# Patient Record
Sex: Female | Born: 1955 | Race: Black or African American | Hispanic: No | Smoking: Former smoker
Health system: Southern US, Community
[De-identification: ages and names within clinical notes are randomized; demographics above are authoritative.]

## PROBLEM LIST (undated history)

## (undated) DIAGNOSIS — M75111 Incomplete rotator cuff tear or rupture of right shoulder, not specified as traumatic: Secondary | ICD-10-CM

## (undated) DIAGNOSIS — M7501 Adhesive capsulitis of right shoulder: Secondary | ICD-10-CM

## (undated) DIAGNOSIS — M1990 Unspecified osteoarthritis, unspecified site: Secondary | ICD-10-CM

## (undated) DIAGNOSIS — R0600 Dyspnea, unspecified: Secondary | ICD-10-CM

## (undated) DIAGNOSIS — F419 Anxiety disorder, unspecified: Secondary | ICD-10-CM

## (undated) DIAGNOSIS — E119 Type 2 diabetes mellitus without complications: Secondary | ICD-10-CM

## (undated) DIAGNOSIS — M7581 Other shoulder lesions, right shoulder: Secondary | ICD-10-CM

## (undated) DIAGNOSIS — K5792 Diverticulitis of intestine, part unspecified, without perforation or abscess without bleeding: Secondary | ICD-10-CM

## (undated) DIAGNOSIS — K219 Gastro-esophageal reflux disease without esophagitis: Secondary | ICD-10-CM

## (undated) DIAGNOSIS — I10 Essential (primary) hypertension: Secondary | ICD-10-CM

## (undated) DIAGNOSIS — D649 Anemia, unspecified: Secondary | ICD-10-CM

## (undated) DIAGNOSIS — S46101A Unspecified injury of muscle, fascia and tendon of long head of biceps, right arm, initial encounter: Secondary | ICD-10-CM

## (undated) DIAGNOSIS — R42 Dizziness and giddiness: Secondary | ICD-10-CM

## (undated) DIAGNOSIS — D509 Iron deficiency anemia, unspecified: Secondary | ICD-10-CM

## (undated) HISTORY — PX: WISDOM TOOTH EXTRACTION: SHX21

## (undated) HISTORY — PX: ABDOMINAL HYSTERECTOMY: SHX81

## (undated) HISTORY — PX: TONSILLECTOMY: SUR1361

---

## 2004-08-12 ENCOUNTER — Ambulatory Visit: Payer: Self-pay

## 2005-01-10 ENCOUNTER — Emergency Department: Payer: Self-pay | Admitting: Emergency Medicine

## 2005-04-29 ENCOUNTER — Emergency Department: Payer: Self-pay | Admitting: Emergency Medicine

## 2005-05-07 ENCOUNTER — Emergency Department: Payer: Self-pay | Admitting: Emergency Medicine

## 2005-05-24 ENCOUNTER — Emergency Department: Payer: Self-pay | Admitting: Unknown Physician Specialty

## 2005-07-05 ENCOUNTER — Emergency Department: Payer: Self-pay | Admitting: Emergency Medicine

## 2005-08-09 ENCOUNTER — Ambulatory Visit: Payer: Self-pay | Admitting: Gastroenterology

## 2005-10-12 ENCOUNTER — Ambulatory Visit: Payer: Self-pay

## 2006-06-15 ENCOUNTER — Ambulatory Visit: Payer: Self-pay | Admitting: Gastroenterology

## 2006-06-26 ENCOUNTER — Ambulatory Visit: Payer: Self-pay | Admitting: Gastroenterology

## 2006-12-13 ENCOUNTER — Ambulatory Visit: Payer: Self-pay

## 2007-11-17 ENCOUNTER — Emergency Department: Payer: Self-pay | Admitting: Internal Medicine

## 2008-01-16 ENCOUNTER — Ambulatory Visit: Payer: Self-pay

## 2008-11-02 ENCOUNTER — Emergency Department: Payer: Self-pay | Admitting: Unknown Physician Specialty

## 2008-12-17 DIAGNOSIS — F419 Anxiety disorder, unspecified: Secondary | ICD-10-CM | POA: Insufficient documentation

## 2008-12-17 DIAGNOSIS — D509 Iron deficiency anemia, unspecified: Secondary | ICD-10-CM | POA: Insufficient documentation

## 2009-02-27 ENCOUNTER — Emergency Department: Payer: Self-pay | Admitting: Emergency Medicine

## 2009-04-15 ENCOUNTER — Ambulatory Visit: Payer: Self-pay

## 2009-05-20 ENCOUNTER — Ambulatory Visit: Payer: Self-pay | Admitting: Gastroenterology

## 2010-06-20 ENCOUNTER — Emergency Department: Payer: Self-pay | Admitting: Emergency Medicine

## 2010-07-14 ENCOUNTER — Ambulatory Visit: Payer: Self-pay

## 2011-02-14 ENCOUNTER — Emergency Department: Payer: Self-pay | Admitting: Emergency Medicine

## 2011-08-16 ENCOUNTER — Ambulatory Visit: Payer: Self-pay

## 2012-07-11 DIAGNOSIS — K219 Gastro-esophageal reflux disease without esophagitis: Secondary | ICD-10-CM | POA: Insufficient documentation

## 2012-08-16 ENCOUNTER — Ambulatory Visit: Payer: Self-pay | Admitting: Family Medicine

## 2013-05-03 ENCOUNTER — Emergency Department: Payer: Self-pay | Admitting: Emergency Medicine

## 2013-05-14 ENCOUNTER — Emergency Department: Payer: Self-pay | Admitting: Emergency Medicine

## 2013-05-14 LAB — BASIC METABOLIC PANEL
ANION GAP: 6 — AB (ref 7–16)
BUN: 12 mg/dL (ref 7–18)
CHLORIDE: 106 mmol/L (ref 98–107)
CO2: 28 mmol/L (ref 21–32)
Calcium, Total: 9.6 mg/dL (ref 8.5–10.1)
Creatinine: 0.87 mg/dL (ref 0.60–1.30)
EGFR (African American): >60
Glucose: 85 mg/dL (ref 65–99)
OSMOLALITY: 278 (ref 275–301)
Potassium: 3.6 mmol/L (ref 3.5–5.1)
Sodium: 140 mmol/L (ref 136–145)

## 2013-05-14 LAB — URINALYSIS, COMPLETE
BACTERIA: NONE SEEN
BLOOD: NEGATIVE
Bilirubin,UR: NEGATIVE
Glucose,UR: NEGATIVE mg/dL (ref 0–75)
KETONE: NEGATIVE
LEUKOCYTE ESTERASE: NEGATIVE
Nitrite: NEGATIVE
PH: 7 (ref 4.5–8.0)
Protein: NEGATIVE
Specific Gravity: 1.003 (ref 1.003–1.030)
Squamous Epithelial: <1

## 2013-05-14 LAB — TROPONIN I: Troponin-I: <0.02 ng/mL

## 2013-05-14 LAB — CBC WITH DIFFERENTIAL/PLATELET
BASOS ABS: 0.1 10*3/uL (ref 0.0–0.1)
Basophil %: 1 %
EOS ABS: 0.1 10*3/uL (ref 0.0–0.7)
EOS PCT: 2.4 %
HCT: 36.8 % (ref 35.0–47.0)
HGB: 11.4 g/dL — ABNORMAL LOW (ref 12.0–16.0)
LYMPHS ABS: 2.4 10*3/uL (ref 1.0–3.6)
Lymphocyte %: 45.5 %
MCH: 26.3 pg (ref 26.0–34.0)
MCHC: 31 g/dL — ABNORMAL LOW (ref 32.0–36.0)
MCV: 85 fL (ref 80–100)
Monocyte #: 0.4 x10 3/mm (ref 0.2–0.9)
Monocyte %: 7.7 %
Neutrophil #: 2.3 10*3/uL (ref 1.4–6.5)
Neutrophil %: 43.4 %
Platelet: 280 10*3/uL (ref 150–440)
RBC: 4.35 10*6/uL (ref 3.80–5.20)
RDW: 13.8 % (ref 11.5–14.5)
WBC: 5.2 10*3/uL (ref 3.6–11.0)

## 2013-06-01 ENCOUNTER — Emergency Department: Payer: Self-pay | Admitting: Emergency Medicine

## 2013-06-01 LAB — CBC WITH DIFFERENTIAL/PLATELET
Basophil #: 0 10*3/uL (ref 0.0–0.1)
Basophil %: 0.8 %
Eosinophil #: 0.1 10*3/uL (ref 0.0–0.7)
Eosinophil %: 1.1 %
HCT: 36 % (ref 35.0–47.0)
HGB: 11.7 g/dL — ABNORMAL LOW (ref 12.0–16.0)
LYMPHS PCT: 30.6 %
Lymphocyte #: 1.7 10*3/uL (ref 1.0–3.6)
MCH: 27.5 pg (ref 26.0–34.0)
MCHC: 32.4 g/dL (ref 32.0–36.0)
MCV: 85 fL (ref 80–100)
MONO ABS: 0.7 x10 3/mm (ref 0.2–0.9)
Monocyte %: 11.9 %
Neutrophil #: 3 10*3/uL (ref 1.4–6.5)
Neutrophil %: 55.6 %
PLATELETS: 213 10*3/uL (ref 150–440)
RBC: 4.24 10*6/uL (ref 3.80–5.20)
RDW: 13.3 % (ref 11.5–14.5)
WBC: 5.5 10*3/uL (ref 3.6–11.0)

## 2013-06-01 LAB — COMPREHENSIVE METABOLIC PANEL
ALT: 27 U/L (ref 12–78)
AST: 22 U/L (ref 15–37)
Albumin: 3.5 g/dL (ref 3.4–5.0)
Alkaline Phosphatase: 67 U/L
Anion Gap: 4 — ABNORMAL LOW (ref 7–16)
BUN: 17 mg/dL (ref 7–18)
Bilirubin,Total: 0.3 mg/dL (ref 0.2–1.0)
CREATININE: 0.82 mg/dL (ref 0.60–1.30)
Calcium, Total: 8.8 mg/dL (ref 8.5–10.1)
Chloride: 104 mmol/L (ref 98–107)
Co2: 30 mmol/L (ref 21–32)
EGFR (African American): >60
EGFR (Non-African Amer.): >60
GLUCOSE: 87 mg/dL (ref 65–99)
OSMOLALITY: 277 (ref 275–301)
Potassium: 3.4 mmol/L — ABNORMAL LOW (ref 3.5–5.1)
SODIUM: 138 mmol/L (ref 136–145)
Total Protein: 7 g/dL (ref 6.4–8.2)

## 2013-06-01 LAB — URINALYSIS, COMPLETE
BILIRUBIN, UR: NEGATIVE
Bacteria: NONE SEEN
GLUCOSE, UR: NEGATIVE mg/dL (ref 0–75)
Ketone: NEGATIVE
Leukocyte Esterase: NEGATIVE
Nitrite: NEGATIVE
PH: 5 (ref 4.5–8.0)
Protein: NEGATIVE
RBC,UR: 3 /HPF (ref 0–5)
Specific Gravity: 1.025 (ref 1.003–1.030)
WBC UR: 3 /HPF (ref 0–5)

## 2013-06-01 LAB — LIPASE, BLOOD: Lipase: 138 U/L (ref 73–393)

## 2013-06-01 LAB — TROPONIN I

## 2013-06-02 LAB — TROPONIN I: Troponin-I: <0.02 ng/mL

## 2013-06-16 ENCOUNTER — Emergency Department: Payer: Self-pay | Admitting: Emergency Medicine

## 2013-06-16 LAB — CBC
HCT: 35.5 % (ref 35.0–47.0)
HGB: 11.1 g/dL — ABNORMAL LOW (ref 12.0–16.0)
MCH: 26.4 pg (ref 26.0–34.0)
MCHC: 31.2 g/dL — AB (ref 32.0–36.0)
MCV: 85 fL (ref 80–100)
Platelet: 264 10*3/uL (ref 150–440)
RBC: 4.2 10*6/uL (ref 3.80–5.20)
RDW: 13.8 % (ref 11.5–14.5)
WBC: 3.6 10*3/uL (ref 3.6–11.0)

## 2013-06-16 LAB — COMPREHENSIVE METABOLIC PANEL
ALT: 35 U/L (ref 12–78)
ANION GAP: 4 — AB (ref 7–16)
AST: 27 U/L (ref 15–37)
Albumin: 3.7 g/dL (ref 3.4–5.0)
Alkaline Phosphatase: 69 U/L
BUN: 12 mg/dL (ref 7–18)
Bilirubin,Total: 0.3 mg/dL (ref 0.2–1.0)
CHLORIDE: 108 mmol/L — AB (ref 98–107)
Calcium, Total: 9.1 mg/dL (ref 8.5–10.1)
Co2: 31 mmol/L (ref 21–32)
Creatinine: 0.95 mg/dL (ref 0.60–1.30)
EGFR (African American): >60
EGFR (Non-African Amer.): >60
Glucose: 91 mg/dL (ref 65–99)
OSMOLALITY: 284 (ref 275–301)
POTASSIUM: 3.7 mmol/L (ref 3.5–5.1)
SODIUM: 143 mmol/L (ref 136–145)
TOTAL PROTEIN: 7.3 g/dL (ref 6.4–8.2)

## 2013-06-16 LAB — LIPASE, BLOOD: Lipase: 146 U/L (ref 73–393)

## 2013-08-19 ENCOUNTER — Ambulatory Visit: Payer: Self-pay | Admitting: Family Medicine

## 2013-12-24 ENCOUNTER — Emergency Department: Payer: Self-pay | Admitting: Emergency Medicine

## 2013-12-24 LAB — CBC WITH DIFFERENTIAL/PLATELET
BASOS PCT: 0.5 %
Basophil #: 0 10*3/uL (ref 0.0–0.1)
Eosinophil #: 0.1 10*3/uL (ref 0.0–0.7)
Eosinophil %: 1.1 %
HCT: 36.1 % (ref 35.0–47.0)
HGB: 11.4 g/dL — ABNORMAL LOW (ref 12.0–16.0)
LYMPHS ABS: 1.4 10*3/uL (ref 1.0–3.6)
Lymphocyte %: 22.3 %
MCH: 27.1 pg (ref 26.0–34.0)
MCHC: 31.5 g/dL — ABNORMAL LOW (ref 32.0–36.0)
MCV: 86 fL (ref 80–100)
MONOS PCT: 8.2 %
Monocyte #: 0.5 x10 3/mm (ref 0.2–0.9)
Neutrophil #: 4.4 10*3/uL (ref 1.4–6.5)
Neutrophil %: 67.9 %
Platelet: 219 10*3/uL (ref 150–440)
RBC: 4.2 10*6/uL (ref 3.80–5.20)
RDW: 13.7 % (ref 11.5–14.5)
WBC: 6.5 10*3/uL (ref 3.6–11.0)

## 2013-12-24 LAB — COMPREHENSIVE METABOLIC PANEL
ALBUMIN: 3.4 g/dL (ref 3.4–5.0)
AST: 66 U/L — AB (ref 15–37)
Alkaline Phosphatase: 96 U/L
Anion Gap: 6 — ABNORMAL LOW (ref 7–16)
BUN: 15 mg/dL (ref 7–18)
Bilirubin,Total: 0.2 mg/dL (ref 0.2–1.0)
CALCIUM: 8.9 mg/dL (ref 8.5–10.1)
CHLORIDE: 109 mmol/L — AB (ref 98–107)
CO2: 31 mmol/L (ref 21–32)
CREATININE: 0.87 mg/dL (ref 0.60–1.30)
EGFR (Non-African Amer.): >60
Glucose: 105 mg/dL — ABNORMAL HIGH (ref 65–99)
OSMOLALITY: 292 (ref 275–301)
Potassium: 3.8 mmol/L (ref 3.5–5.1)
SGPT (ALT): 65 U/L — ABNORMAL HIGH
SODIUM: 146 mmol/L — AB (ref 136–145)
TOTAL PROTEIN: 7 g/dL (ref 6.4–8.2)

## 2013-12-24 LAB — URINALYSIS, COMPLETE
BILIRUBIN, UR: NEGATIVE
BLOOD: NEGATIVE
Bacteria: NONE SEEN
GLUCOSE, UR: NEGATIVE mg/dL (ref 0–75)
Ketone: NEGATIVE
LEUKOCYTE ESTERASE: NEGATIVE
NITRITE: NEGATIVE
PH: 7 (ref 4.5–8.0)
Protein: NEGATIVE
RBC,UR: 2 /HPF (ref 0–5)
Specific Gravity: 1.016 (ref 1.003–1.030)
WBC UR: 1 /HPF (ref 0–5)

## 2013-12-24 LAB — TROPONIN I: Troponin-I: <0.02 ng/mL

## 2013-12-24 LAB — LIPASE, BLOOD: Lipase: 145 U/L (ref 73–393)

## 2015-01-22 ENCOUNTER — Other Ambulatory Visit: Payer: Self-pay | Admitting: Family Medicine

## 2015-01-22 DIAGNOSIS — Z1231 Encounter for screening mammogram for malignant neoplasm of breast: Secondary | ICD-10-CM

## 2015-01-27 ENCOUNTER — Ambulatory Visit
Admission: RE | Admit: 2015-01-27 | Discharge: 2015-01-27 | Disposition: A | Discharge status: Home / Self Care | Payer: BLUE CROSS/BLUE SHIELD | Source: Ambulatory Visit | Attending: Family Medicine | Admitting: Family Medicine

## 2015-01-27 DIAGNOSIS — Z1231 Encounter for screening mammogram for malignant neoplasm of breast: Secondary | ICD-10-CM

## 2015-10-20 ENCOUNTER — Emergency Department: Payer: BLUE CROSS/BLUE SHIELD

## 2015-10-20 ENCOUNTER — Emergency Department
Admission: EM | Admit: 2015-10-20 | Discharge: 2015-10-20 | Disposition: A | Discharge status: Home / Self Care | Payer: BLUE CROSS/BLUE SHIELD | Attending: Emergency Medicine | Admitting: Emergency Medicine

## 2015-10-20 DIAGNOSIS — K5732 Diverticulitis of large intestine without perforation or abscess without bleeding: Secondary | ICD-10-CM | POA: Insufficient documentation

## 2015-10-20 DIAGNOSIS — R1032 Left lower quadrant pain: Secondary | ICD-10-CM | POA: Diagnosis present

## 2015-10-20 HISTORY — DX: Diverticulitis of intestine, part unspecified, without perforation or abscess without bleeding: K57.92

## 2015-10-20 LAB — COMPREHENSIVE METABOLIC PANEL
ALT: 43 U/L (ref 14–54)
ANION GAP: 9 (ref 5–15)
AST: 34 U/L (ref 15–41)
Albumin: 4.3 g/dL (ref 3.5–5.0)
Alkaline Phosphatase: 63 U/L (ref 38–126)
BUN: 17 mg/dL (ref 6–20)
CALCIUM: 9.5 mg/dL (ref 8.9–10.3)
CHLORIDE: 106 mmol/L (ref 101–111)
CO2: 25 mmol/L (ref 22–32)
Creatinine, Ser: 0.8 mg/dL (ref 0.44–1.00)
Glucose, Bld: 84 mg/dL (ref 65–99)
POTASSIUM: 3.5 mmol/L (ref 3.5–5.1)
SODIUM: 140 mmol/L (ref 135–145)
TOTAL PROTEIN: 7.4 g/dL (ref 6.5–8.1)
Total Bilirubin: 0.4 mg/dL (ref 0.3–1.2)

## 2015-10-20 LAB — URINALYSIS COMPLETE WITH MICROSCOPIC (ARMC ONLY)
BILIRUBIN URINE: NEGATIVE
Bacteria, UA: NONE SEEN
GLUCOSE, UA: NEGATIVE mg/dL
HGB URINE DIPSTICK: NEGATIVE
Ketones, ur: NEGATIVE mg/dL
LEUKOCYTES UA: NEGATIVE
NITRITE: NEGATIVE
Protein, ur: NEGATIVE mg/dL
RBC / HPF: NONE SEEN RBC/hpf (ref 0–5)
Specific Gravity, Urine: 1.017 (ref 1.005–1.030)
pH: 6 (ref 5.0–8.0)

## 2015-10-20 LAB — CBC
HCT: 37.6 % (ref 35.0–47.0)
Hemoglobin: 12.4 g/dL (ref 12.0–16.0)
MCH: 27.7 pg (ref 26.0–34.0)
MCHC: 33 g/dL (ref 32.0–36.0)
MCV: 84 fL (ref 80.0–100.0)
PLATELETS: 212 10*3/uL (ref 150–440)
RBC: 4.47 MIL/uL (ref 3.80–5.20)
RDW: 13.4 % (ref 11.5–14.5)
WBC: 5.7 10*3/uL (ref 3.6–11.0)

## 2015-10-20 LAB — LIPASE, BLOOD: LIPASE: 24 U/L (ref 11–51)

## 2015-10-20 MED ORDER — DIATRIZOATE MEGLUMINE & SODIUM 66-10 % PO SOLN
15.0000 mL | Freq: Once | ORAL | Status: AC
  Administered 2015-10-20: 15 mL via ORAL

## 2015-10-20 MED ORDER — SODIUM CHLORIDE 0.9 % IV BOLUS (SEPSIS)
1000.0000 mL | Freq: Once | INTRAVENOUS | Status: AC
  Administered 2015-10-20: 1000 mL via INTRAVENOUS

## 2015-10-20 MED ORDER — ONDANSETRON HCL 4 MG/2ML IJ SOLN
4.0000 mg | Freq: Once | INTRAMUSCULAR | Status: AC
  Administered 2015-10-20: 4 mg via INTRAVENOUS
  Filled 2015-10-20: qty 2

## 2015-10-20 MED ORDER — METRONIDAZOLE 500 MG PO TABS: 500.0000 mg | ORAL_TABLET | Freq: Three times a day (TID) | ORAL | 0 refills | Status: AC

## 2015-10-20 MED ORDER — TRAMADOL HCL 50 MG PO TABS: 50.0000 mg | ORAL_TABLET | Freq: Four times a day (QID) | ORAL | 0 refills | Status: AC | PRN

## 2015-10-20 MED ORDER — IOPAMIDOL (ISOVUE-300) INJECTION 61%
100.0000 mL | Freq: Once | INTRAVENOUS | Status: AC | PRN
  Administered 2015-10-20: 100 mL via INTRAVENOUS

## 2015-10-20 MED ORDER — METRONIDAZOLE 500 MG PO TABS
500.0000 mg | ORAL_TABLET | Freq: Once | ORAL | Status: AC
  Administered 2015-10-20: 500 mg via ORAL
  Filled 2015-10-20: qty 1

## 2015-10-20 MED ORDER — CIPROFLOXACIN HCL 500 MG PO TABS
500.0000 mg | ORAL_TABLET | Freq: Once | ORAL | Status: AC
  Administered 2015-10-20: 500 mg via ORAL
  Filled 2015-10-20: qty 1

## 2015-10-20 MED ORDER — CIPROFLOXACIN HCL 500 MG PO TABS: 500.0000 mg | ORAL_TABLET | Freq: Two times a day (BID) | ORAL | 0 refills | Status: AC

## 2015-10-20 MED ORDER — KETOROLAC TROMETHAMINE 30 MG/ML IJ SOLN
15.0000 mg | Freq: Once | INTRAMUSCULAR | Status: AC
  Administered 2015-10-20: 15 mg via INTRAVENOUS
  Filled 2015-10-20: qty 1

## 2015-10-20 NOTE — ED Notes (Signed)
Triage completed by Barkley Boards RN.

## 2015-10-20 NOTE — ED Provider Notes (Signed)
[facility] Emergency Department Provider Note  Time seen: 3:09 PM  I have reviewed the triage vital signs and the nursing notes.   HISTORY  Chief Complaint Abdominal Pain    HPI Yolanda Barker is a 60 y.o. female with a past medical history of diverticulitis who presents the emergency department with lower abdominal discomfort. According to the patient beginning yesterday morning she experienced lower abdominal discomfort which has progressively worsened. States mostly left lower quadrant but it does hurt across her entire lower abdomen. States occasional nausea but denies any vomiting. Has had recent loose stool but denies any today. Denies any fever or chills. Describes the pain as severe dull aching pain mostly in the left lower quadrant.  Past Medical History:  Diagnosis Date  . Diverticulitis     There are no active problems to display for this patient.   History reviewed. No pertinent surgical history.  Prior to Admission medications   Not on File    Allergies not on file  Family History  Problem Relation Age of Onset  . Breast cancer Neg Hx     Social History Social History  Substance Use Topics  . Smoking status: Never Smoker  . Smokeless tobacco: Not on file  . Alcohol use No    Review of Systems Constitutional: Negative for fever. Cardiovascular: Negative for chest pain. Respiratory: Negative for shortness of breath. Gastrointestinal:Lower abdominal pain. Genitourinary: Negative for dysuria. Negative for hematuria Musculoskeletal: Negative for back pain. Skin: Negative for rash. Neurological: Negative for headaches, focal weakness or numbness. 10-point ROS otherwise negative.  ____________________________________________   PHYSICAL EXAM:  VITAL SIGNS: ED Triage Vitals  Enc Vitals Group     BP 10/20/15 1205 (!) 150/86     Pulse Rate 10/20/15 1205 64     Resp 10/20/15 1205 15     Temp 10/20/15 1205 98.3 F (36.8 C)    Temp Source 10/20/15 1205 Oral     SpO2 10/20/15 1205 99 %     Weight 10/20/15 1205 155 lb (70.3 kg)     Height 10/20/15 1205 4\' 11"  (1.499 m)     Head Circumference --      Peak Flow --      Pain Score 10/20/15 1216 8     Pain Loc --      Pain Edu? --      Excl. in [place]? --     Constitutional: Alert and oriented. Well appearing and in no distress. Eyes: Normal exam ENT   Head: Normocephalic and atraumatic   Mouth/Throat: Mucous membranes are moist. Cardiovascular: Normal rate, regular rhythm. No murmurs, rubs, or gallops. Respiratory: Normal respiratory effort without tachypnea nor retractions. Breath sounds are clear Gastrointestinal: Soft, moderate lower abdominal tenderness to palpation especially in the left lower quadrant, no rebound or guarding. No distention. No CVA tenderness. Musculoskeletal: Nontender with normal range of motion in all extremities. No lower extremity tenderness or edema. Neurologic:  Normal speech and language. No gross focal neurologic deficits are appreciated. Skin:  Skin is warm, dry and intact.  Psychiatric: Mood and affect are normal. Speech and behavior are normal.   ____________________________________________     RADIOLOGY  CT consistent with uncomplicated diverticulitis.  ____________________________________________   INITIAL IMPRESSION / ASSESSMENT AND PLAN / ED COURSE  Pertinent labs & imaging results that were available during my care of the patient were reviewed by me and considered in my medical decision making (see chart for details).  The patient presents the  emergency department lower abdominal pain beginning last night. Patient has a history of diverticulitis in the past, last of which was 2015. Patient has moderate tenderness to palpation. Labs are largely within normal limits however given the degree of tenderness we will proceed with a CT abdomen/pelvis to further evaluate. Patient is agreeable to plan.  CT consistent  with uncomplicated diverticulitis. We'll discharge on Cipro/Flagyl and Ultram for pain as needed. The patient is agreeable to this plan and will follow up with her PCP this week. I discussed return precautions.  ____________________________________________   FINAL CLINICAL IMPRESSION(S) / ED DIAGNOSES  Lower abdominal pain Diverticulitis   Harvest Dark, MD 10/20/15 [phone]

## 2015-10-20 NOTE — ED Triage Notes (Signed)
Pt presents to ED c/o left lower abdominal pain that started yesterday. Pt with known diverticulitis. Tried tylenol without relief ; denies fever, bleeding. Pain 8/10

## 2015-11-27 ENCOUNTER — Other Ambulatory Visit: Payer: Self-pay | Admitting: Gastroenterology

## 2015-11-27 ENCOUNTER — Ambulatory Visit
Admission: RE | Admit: 2015-11-27 | Discharge: 2015-11-27 | Disposition: A | Discharge status: Home / Self Care | Payer: BLUE CROSS/BLUE SHIELD | Source: Ambulatory Visit | Attending: Gastroenterology | Admitting: Gastroenterology

## 2015-11-27 DIAGNOSIS — M4185 Other forms of scoliosis, thoracolumbar region: Secondary | ICD-10-CM | POA: Diagnosis not present

## 2015-11-27 DIAGNOSIS — Z8719 Personal history of other diseases of the digestive system: Secondary | ICD-10-CM | POA: Diagnosis present

## 2015-11-27 DIAGNOSIS — R1032 Left lower quadrant pain: Secondary | ICD-10-CM

## 2015-12-28 ENCOUNTER — Other Ambulatory Visit: Payer: Self-pay | Admitting: Family Medicine

## 2015-12-28 ENCOUNTER — Encounter: Payer: Self-pay | Admitting: Emergency Medicine

## 2015-12-28 ENCOUNTER — Emergency Department: Payer: BLUE CROSS/BLUE SHIELD

## 2015-12-28 ENCOUNTER — Emergency Department
Admission: EM | Admit: 2015-12-28 | Discharge: 2015-12-29 | Disposition: A | Discharge status: Home / Self Care | Payer: BLUE CROSS/BLUE SHIELD | Attending: Emergency Medicine | Admitting: Emergency Medicine

## 2015-12-28 DIAGNOSIS — Z79899 Other long term (current) drug therapy: Secondary | ICD-10-CM | POA: Diagnosis not present

## 2015-12-28 DIAGNOSIS — R1032 Left lower quadrant pain: Secondary | ICD-10-CM | POA: Diagnosis present

## 2015-12-28 DIAGNOSIS — K5732 Diverticulitis of large intestine without perforation or abscess without bleeding: Secondary | ICD-10-CM

## 2015-12-28 DIAGNOSIS — Z1231 Encounter for screening mammogram for malignant neoplasm of breast: Secondary | ICD-10-CM

## 2015-12-28 LAB — COMPREHENSIVE METABOLIC PANEL
ALBUMIN: 3.9 g/dL (ref 3.5–5.0)
ALT: 81 U/L — AB (ref 14–54)
AST: 71 U/L — AB (ref 15–41)
Alkaline Phosphatase: 81 U/L (ref 38–126)
Anion gap: 6 (ref 5–15)
BUN: 13 mg/dL (ref 6–20)
CHLORIDE: 106 mmol/L (ref 101–111)
CO2: 27 mmol/L (ref 22–32)
CREATININE: 0.78 mg/dL (ref 0.44–1.00)
Calcium: 9.2 mg/dL (ref 8.9–10.3)
GFR calc Af Amer: >60 mL/min (ref >60)
GFR calc non Af Amer: >60 mL/min (ref >60)
GLUCOSE: 114 mg/dL — AB (ref 65–99)
Potassium: 3.6 mmol/L (ref 3.5–5.1)
SODIUM: 139 mmol/L (ref 135–145)
Total Bilirubin: 0.6 mg/dL (ref 0.3–1.2)
Total Protein: 7.2 g/dL (ref 6.5–8.1)

## 2015-12-28 LAB — CBC
HEMATOCRIT: 37.3 % (ref 35.0–47.0)
Hemoglobin: 12.2 g/dL (ref 12.0–16.0)
MCH: 27.1 pg (ref 26.0–34.0)
MCHC: 32.6 g/dL (ref 32.0–36.0)
MCV: 83.4 fL (ref 80.0–100.0)
PLATELETS: 218 10*3/uL (ref 150–440)
RBC: 4.48 MIL/uL (ref 3.80–5.20)
RDW: 14.4 % (ref 11.5–14.5)
WBC: 6.5 10*3/uL (ref 3.6–11.0)

## 2015-12-28 LAB — URINALYSIS COMPLETE WITH MICROSCOPIC (ARMC ONLY)
BACTERIA UA: NONE SEEN
BILIRUBIN URINE: NEGATIVE
GLUCOSE, UA: NEGATIVE mg/dL
Hgb urine dipstick: NEGATIVE
Ketones, ur: NEGATIVE mg/dL
Leukocytes, UA: NEGATIVE
Nitrite: NEGATIVE
PH: 7 (ref 5.0–8.0)
Protein, ur: NEGATIVE mg/dL
Specific Gravity, Urine: 1.015 (ref 1.005–1.030)

## 2015-12-28 LAB — LIPASE, BLOOD: LIPASE: 29 U/L (ref 11–51)

## 2015-12-28 MED ORDER — IOPAMIDOL (ISOVUE-300) INJECTION 61%
30.0000 mL | Freq: Once | INTRAVENOUS | Status: AC | PRN
  Administered 2015-12-28: 30 mL via ORAL
  Filled 2015-12-28: qty 30

## 2015-12-28 MED ORDER — ACETAMINOPHEN 325 MG PO TABS
650.0000 mg | ORAL_TABLET | Freq: Once | ORAL | Status: AC
  Administered 2015-12-28: 650 mg via ORAL
  Filled 2015-12-28: qty 2

## 2015-12-28 MED ORDER — IOPAMIDOL (ISOVUE-300) INJECTION 61%
100.0000 mL | Freq: Once | INTRAVENOUS | Status: AC | PRN
  Administered 2015-12-28: 100 mL via INTRAVENOUS

## 2015-12-28 NOTE — ED Notes (Signed)
Pt having RLQ pain with hx of diverticulitis.  Pt also states that she has been having problems feeling relief with bowel movements and felt constipated.  Pt reports having her last bm yesterday.  Pt reports taking a stool softener afterwards, but has not had another BM at this time.

## 2015-12-28 NOTE — ED Provider Notes (Addendum)
[facility] Emergency Department Provider Note  ____________________________________________   I have reviewed the triage vital signs and the nursing notes.   HISTORY  Chief Complaint Abdominal Pain    HPI Yolanda Barker is a 60 y.o. female w hx of divertic dz presents with llq pain since yesterday. Feels like diverticular disease. No fever no vomiting no diarrhea. tol po well. Sharp non radiating but hurts when she walks around no urinary sx.  Gradual onset  Declines pain med at this time.     Past Medical History:  Diagnosis Date  . Diverticulitis     There are no active problems to display for this patient.   History reviewed. No pertinent surgical history.  Prior to Admission medications   Medication Sig Start Date End Date Taking? Authorizing Provider  acetaminophen (TYLENOL) 500 MG tablet Take 1 tablet by mouth every 6 (six) hours as needed.    Historical Provider, MD  Cholecalciferol (VITAMIN D-1000 MAX ST) 1000 units tablet Take 1 tablet by mouth daily.    Historical Provider, MD  ferrous sulfate 325 (65 FE) MG tablet Take 1 tablet by mouth daily with breakfast.    Historical Provider, MD  traMADol (ULTRAM) 50 MG tablet Take 1 tablet (50 mg total) by mouth every 6 (six) hours as needed. 10/20/15 10/19/16  Harvest Dark, MD    Allergies Penicillin g  Family History  Problem Relation Age of Onset  . Breast cancer Neg Hx     Social History Social History  Substance Use Topics  . Smoking status: Never Smoker  . Smokeless tobacco: Never Used  . Alcohol use No    Review of Systems Constitutional: No fever/chills Eyes: No visual changes. ENT: No sore throat. No stiff neck no neck pain Cardiovascular: Denies chest pain. Respiratory: Denies shortness of breath. Gastrointestinal:   no vomiting.  No diarrhea.  No constipation. Genitourinary: Negative for dysuria. Musculoskeletal: Negative lower extremity swelling Skin: Negative for  rash. Neurological: Negative for severe headaches, focal weakness or numbness. 10-point ROS otherwise negative.  ____________________________________________   PHYSICAL EXAM:  VITAL SIGNS: ED Triage Vitals  Enc Vitals Group     BP 12/28/15 1852 (!) 157/86     Pulse Rate 12/28/15 1852 71     Resp 12/28/15 1852 16     Temp 12/28/15 1852 98.2 F (36.8 C)     Temp Source 12/28/15 1852 Oral     SpO2 12/28/15 1852 100 %     Weight --      Height --      Head Circumference --      Peak Flow --      Pain Score 12/28/15 1851 9     Pain Loc --      Pain Edu? --      Excl. in [place]? --     Constitutional: Alert and oriented. Well appearing and in no acute distress. Eyes: Conjunctivae are normal. PERRL. EOMI. Head: Atraumatic. Nose: No congestion/rhinnorhea. Mouth/Throat: Mucous membranes are moist.  Oropharynx non-erythematous. Neck: No stridor.   Nontender with no meningismus Cardiovascular: Normal rate, regular rhythm. Grossly normal heart sounds.  Good peripheral circulation. Respiratory: Normal respiratory effort.  No retractions. Lungs CTAB. Abdominal: Soft and + tenderness w/ vol guarding to llq. No distention. no rebound Back:  There is no focal tenderness or step off.  there is no midline tenderness there are no lesions noted. there is no CVA tenderness Musculoskeletal: No lower extremity tenderness, no upper extremity tenderness.  No joint effusions, no DVT signs strong distal pulses no edema Neurologic:  Normal speech and language. No gross focal neurologic deficits are appreciated.  Skin:  Skin is warm, dry and intact. No rash noted. Psychiatric: Mood and affect are normal. Speech and behavior are normal.  ____________________________________________   LABS (all labs ordered are listed, but only abnormal results are displayed)  Labs Reviewed  COMPREHENSIVE METABOLIC PANEL - Abnormal; Notable for the following:       Result Value   Glucose, Bld 114 (*)    AST 71 (*)     ALT 81 (*)    All other components within normal limits  URINALYSIS COMPLETEWITH MICROSCOPIC ([facility] ONLY) - Abnormal; Notable for the following:    Color, Urine YELLOW (*)    APPearance CLEAR (*)    Squamous Epithelial / LPF 0-5 (*)    All other components within normal limits  LIPASE, BLOOD  CBC   ____________________________________________  EKG  I personally interpreted any EKGs ordered by me or triage  ____________________________________________  RADIOLOGY  I reviewed any imaging ordered by me or triage that were performed during my shift and, if possible, patient and/or family made aware of any abnormal findings. ____________________________________________   PROCEDURES  Procedure(s) performed: None  Procedures  Critical Care performed: None  ____________________________________________   INITIAL IMPRESSION / ASSESSMENT AND PLAN / ED COURSE  Pertinent labs & imaging results that were available during my care of the patient were reviewed by me and considered in my medical decision making (see chart for details).  Pt with focal LLQ pain. Hx of diverticular disease.  Given voluntary guarding I will ct to rule out perf, though low suspicion.    ----------------------------------------- 11:11 PM on 12/28/2015 -----------------------------------------    Signed out at the end of my shift to dr. Dahlia Client.   Clinical Course   ____________________________________________   FINAL CLINICAL IMPRESSION(S) / ED DIAGNOSES  Final diagnoses:  None      This chart was dictated using voice recognition software.  Despite best efforts to proofread,  errors can occur which can change meaning.      Schuyler Amor, MD 12/28/15 [id number]    Schuyler Amor, MD 12/28/15 [phone]

## 2015-12-28 NOTE — ED Triage Notes (Signed)
C/O LLQ pain x 1 day.  Denies N/V/D.  Last BM yesterday.

## 2015-12-29 MED ORDER — CIPROFLOXACIN HCL 500 MG PO TABS: 500.0000 mg | ORAL_TABLET | Freq: Two times a day (BID) | ORAL | 0 refills | Status: AC

## 2015-12-29 MED ORDER — METRONIDAZOLE 500 MG PO TABS: 500.0000 mg | ORAL_TABLET | Freq: Two times a day (BID) | ORAL | 0 refills | Status: AC

## 2015-12-29 NOTE — ED Notes (Signed)

## 2015-12-29 NOTE — ED Provider Notes (Signed)
-----------------------------------------   12:49 AM on 12/29/2015 -----------------------------------------   Blood pressure (!) 157/86, pulse 71, temperature 98.2 F (36.8 C), temperature source Oral, resp. rate 16, SpO2 100 %.  Assuming care from Dr. Burlene Arnt.  In short, Yolanda Barker is a 60 y.o. female with a chief complaint of Abdominal Pain .  Refer to the original H&P for additional details.  The current plan of care is to follow up the results of the CT scan.   CT abd and pelvis: Probable recurrence of pericolonic inflammatory change associated with acute diverticulitis along the distal descending and proximal sigmoid colon. No bowel obstruction or definite abscess.  Malrotation of small intestine.  Left-sided nephrolithiasis.  I will write the patient a prescription for ciprofloxacin and Flagyl. The patient does not want anything for pain at this time. She'll be discharged to home and follow-up with her primary care physician.   Loney Hering, MD 12/29/15 [phone]

## 2016-01-28 ENCOUNTER — Ambulatory Visit
Admission: RE | Admit: 2016-01-28 | Discharge: 2016-01-28 | Disposition: A | Discharge status: Home / Self Care | Payer: BLUE CROSS/BLUE SHIELD | Source: Ambulatory Visit | Attending: Family Medicine | Admitting: Family Medicine

## 2016-01-28 DIAGNOSIS — Z1231 Encounter for screening mammogram for malignant neoplasm of breast: Secondary | ICD-10-CM | POA: Diagnosis not present

## 2016-03-11 ENCOUNTER — Encounter: Payer: Self-pay | Admitting: *Deleted

## 2016-03-15 ENCOUNTER — Ambulatory Visit: Payer: BLUE CROSS/BLUE SHIELD | Admitting: Anesthesiology

## 2016-03-15 ENCOUNTER — Encounter
Admission: RE | Disposition: A | Discharge status: Home / Self Care | Payer: Self-pay | Source: Ambulatory Visit | Attending: Gastroenterology

## 2016-03-15 ENCOUNTER — Encounter: Payer: Self-pay | Admitting: *Deleted

## 2016-03-15 ENCOUNTER — Ambulatory Visit
Admission: RE | Admit: 2016-03-15 | Discharge: 2016-03-15 | Disposition: A | Discharge status: Home / Self Care | Payer: BLUE CROSS/BLUE SHIELD | Source: Ambulatory Visit | Attending: Gastroenterology | Admitting: Gastroenterology

## 2016-03-15 DIAGNOSIS — Z87891 Personal history of nicotine dependence: Secondary | ICD-10-CM | POA: Insufficient documentation

## 2016-03-15 DIAGNOSIS — Z5309 Procedure and treatment not carried out because of other contraindication: Secondary | ICD-10-CM | POA: Insufficient documentation

## 2016-03-15 DIAGNOSIS — K5730 Diverticulosis of large intestine without perforation or abscess without bleeding: Secondary | ICD-10-CM | POA: Diagnosis not present

## 2016-03-15 DIAGNOSIS — Z09 Encounter for follow-up examination after completed treatment for conditions other than malignant neoplasm: Secondary | ICD-10-CM | POA: Diagnosis present

## 2016-03-15 DIAGNOSIS — K219 Gastro-esophageal reflux disease without esophagitis: Secondary | ICD-10-CM | POA: Insufficient documentation

## 2016-03-15 DIAGNOSIS — K562 Volvulus: Secondary | ICD-10-CM | POA: Insufficient documentation

## 2016-03-15 DIAGNOSIS — J45909 Unspecified asthma, uncomplicated: Secondary | ICD-10-CM | POA: Insufficient documentation

## 2016-03-15 HISTORY — DX: Dizziness and giddiness: R42

## 2016-03-15 HISTORY — DX: Gastro-esophageal reflux disease without esophagitis: K21.9

## 2016-03-15 HISTORY — DX: Anemia, unspecified: D64.9

## 2016-03-15 HISTORY — PX: COLONOSCOPY WITH PROPOFOL: SHX5780

## 2016-03-15 SURGERY — COLONOSCOPY WITH PROPOFOL
Anesthesia: General

## 2016-03-15 MED ORDER — PROPOFOL 500 MG/50ML IV EMUL
INTRAVENOUS | Status: AC
  Filled 2016-03-15: qty 50

## 2016-03-15 MED ORDER — MIDAZOLAM HCL 2 MG/2ML IJ SOLN
INTRAMUSCULAR | Status: AC
  Filled 2016-03-15: qty 2

## 2016-03-15 MED ORDER — SODIUM CHLORIDE 0.9 % IV SOLN: INTRAVENOUS | Status: DC

## 2016-03-15 MED ORDER — ARTIFICIAL TEARS OP OINT
TOPICAL_OINTMENT | OPHTHALMIC | Status: AC
  Filled 2016-03-15: qty 3.5

## 2016-03-15 MED ORDER — FENTANYL CITRATE (PF) 100 MCG/2ML IJ SOLN
INTRAMUSCULAR | Status: AC
  Filled 2016-03-15: qty 2

## 2016-03-15 MED ORDER — PROPOFOL 500 MG/50ML IV EMUL
INTRAVENOUS | Status: DC | PRN
Start: 2016-03-15 — End: 2016-03-15
  Administered 2016-03-15: 185 ug/kg/min via INTRAVENOUS

## 2016-03-15 MED ORDER — SODIUM CHLORIDE 0.9 % IV SOLN
INTRAVENOUS | Status: DC
  Administered 2016-03-15: 1000 mL via INTRAVENOUS
  Administered 2016-03-15: 14:00:00 via INTRAVENOUS

## 2016-03-15 MED ORDER — PROPOFOL 10 MG/ML IV BOLUS
INTRAVENOUS | Status: AC
  Filled 2016-03-15: qty 20

## 2016-03-15 MED ORDER — PROPOFOL 10 MG/ML IV BOLUS
INTRAVENOUS | Status: DC | PRN
  Administered 2016-03-15: 50 mg via INTRAVENOUS

## 2016-03-15 NOTE — Op Note (Signed)
[facility] Patient Name: Yolanda Barker Procedure Date: 03/15/2016 2:17 PM MRN: [id number] Account #: [id number] Date of Birth: [birth year] Admit Type: Outpatient Age: 61 Room: [facility] ENDO ROOM 3 Gender: Female Note Status: Finalized Procedure:            Colonoscopy Indications:          Follow-up of diverticulitis Providers:            Lollie Sails, MD Referring MD:         Baxter Kail. Rebeca Alert MD, MD (Referring MD) Medicines:            Monitored Anesthesia Care Complications:        No immediate complications. Procedure:            Pre-Anesthesia Assessment:                       - ASA Grade Assessment: III - A patient with severe                        systemic disease.                       After obtaining informed consent, the colonoscope was                        passed under direct vision. Throughout the procedure,                        the patient's blood pressure, pulse, and oxygen                        saturations were monitored continuously. The                        Colonoscope was introduced through the anus with the                        intention of advancing to the cecum. The scope was                        advanced to the splenic flexure before the procedure                        was aborted. Medications were given. The colonoscopy                        was unusually difficult due to restricted mobility of                        the colon, significant looping and a tortuous colon.                        The patient tolerated the procedure well. The quality                        of the bowel preparation was good. Findings:      Multiple small to medium-mouthed diverticula were found in the sigmoid       colon and descending colon.      The sigmoid colon and descending colon were significantly redundant. The  colon also showed the appearance of marked "tethering" which prevented       the scope from passing beyond the  region of the splenic flexure. Impression:           - Diverticulosis in the sigmoid colon and in the                        descending colon.                       - Redundant colon.                       - No specimens collected. Recommendation:       - Discharge patient to home.                       - Perform an air contrast barium enema at appointment                        to be scheduled.                       - Return to GI clinic in 3 weeks. Procedure Code(s):    --- Professional ---                       [phone], 57, Colonoscopy, flexible; diagnostic, including                        collection of specimen(s) by brushing or washing, when                        performed (separate procedure) Diagnosis Code(s):    --- Professional ---                       [id number], Diverticulitis of large intestine without                        perforation or abscess without bleeding                       K57.30, Diverticulosis of large intestine without                        perforation or abscess without bleeding                       Q43.8, Other specified congenital malformations of                        intestine CPT copyright 2016 American [employer] Association. All rights reserved. The codes documented in this report are preliminary and upon coder review may  be revised to meet current compliance requirements. Lollie Sails, MD 03/15/2016 3:31:00 PM This report has been signed electronically. Number of Addenda: 0 Note Initiated On: 03/15/2016 2:17 PM Total Procedure Duration: 0 hours 39 minutes 32 seconds       [facility]

## 2016-03-15 NOTE — Progress Notes (Signed)
Per Dr. Gustavo Lah... Colonoscopy complete to 80 cm colon due to tortuosity of colon.

## 2016-03-15 NOTE — H&P (Signed)
Outpatient short stay form Pre-procedure 03/15/2016 1:41 PM Yolanda Sails MD  Primary Physician: Dr. Rutherford Guys  Reason for visit:  Colonoscopy  History of present illness:  Patient is a 61 year old female presenting today as above. She had a recent episode about 2 months ago of diverticulitis. She has had some recurrent left lower quadrant discomfort but is feeling better currently. She tolerated her prep well. She takes no blood thinning agents or aspirin products.    Current Facility-Administered Medications:  .  0.9 %  sodium chloride infusion, , Intravenous, Continuous, Yolanda Sails, MD, Last Rate: 20 mL/hr at 03/15/16 1245, 1,000 mL at 03/15/16 1245 .  0.9 %  sodium chloride infusion, , Intravenous, Continuous, Yolanda Sails, MD  Prescriptions Prior to Admission  Medication Sig Dispense Refill Last Dose  . acetaminophen (TYLENOL) 500 MG tablet Take 1 tablet by mouth every 6 (six) hours as needed.   Past Week at Unknown time  . albuterol (PROVENTIL HFA;VENTOLIN HFA) 108 (90 Base) MCG/ACT inhaler Inhale 2 puffs into the lungs every 6 (six) hours as needed for wheezing or shortness of breath.   Past Week at Unknown time  . cetirizine (ZYRTEC) 10 MG tablet Take 10 mg by mouth daily.   Past Week at Unknown time  . Cholecalciferol (VITAMIN D-1000 MAX ST) 1000 units tablet Take 1 tablet by mouth daily.   Past Week at Unknown time  . docusate calcium (SURFAK) 240 MG capsule Take 240 mg by mouth daily.   Past Week at Unknown time  . ferrous sulfate 325 (65 FE) MG tablet Take 1 tablet by mouth daily with breakfast.   Past Week at Unknown time  . fluticasone (FLONASE ALLERGY RELIEF) 50 MCG/ACT nasal spray Place 2 sprays into both nostrils daily.   Past Week at Unknown time  . meclizine (ANTIVERT) 25 MG tablet Take 25 mg by mouth 3 (three) times daily as needed for dizziness.   Past Week at Unknown time  . omeprazole (PRILOSEC) 20 MG capsule Take 20 mg by mouth 2 (two) times daily  before a meal.   Past Week at Unknown time  . triamcinolone cream (KENALOG) 0.1 % Apply 1 application topically 2 (two) times daily.   Past Week at Unknown time  . traMADol (ULTRAM) 50 MG tablet Take 1 tablet (50 mg total) by mouth every 6 (six) hours as needed. 20 tablet 0      Allergies  Allergen Reactions  . Penicillin G Rash     Past Medical History:  Diagnosis Date  . Anemia   . Asthma   . Diverticulitis   . GERD (gastroesophageal reflux disease)   . Vertigo     Review of systems:      Physical Exam    Heart and lungs: Regular rate and rhythm without rub or gallop, lungs are bilaterally clear.    HEENT: Normocephalic atraumatic eyes are anicteric    Other:     Pertinant exam for procedure: Soft nontender nondistended bowel sounds positive normoactive.    Planned proceedures: Colonoscopy and indicated procedures. I have discussed the risks benefits and complications of procedures to include not limited to bleeding, infection, perforation and the risk of sedation and the patient wishes to proceed.    Yolanda Sails, MD Gastroenterology 03/15/2016  1:41 PM

## 2016-03-15 NOTE — Anesthesia Preprocedure Evaluation (Signed)
Anesthesia Evaluation  Patient identified by MRN, date of birth, ID band Patient awake    Reviewed: Allergy & Precautions, H&P , NPO status , Patient's Chart, lab work & pertinent test results  History of Anesthesia Complications Negative for: history of anesthetic complications  Airway Mallampati: III  TM Distance: >3 FB Neck ROM: full    Dental  (+) Poor Dentition, Chipped, Missing, Partial Upper   Pulmonary neg shortness of breath, asthma , former smoker,    Pulmonary exam normal breath sounds clear to auscultation       Cardiovascular Exercise Tolerance: Good (-) angina(-) Past MI and (-) DOE negative cardio ROS Normal cardiovascular exam Rhythm:regular Rate:Normal     Neuro/Psych negative neurological ROS  negative psych ROS   GI/Hepatic Neg liver ROS, GERD  Controlled and Medicated,  Endo/Other  negative endocrine ROS  Renal/GU negative Renal ROS  negative genitourinary   Musculoskeletal   Abdominal   Peds  Hematology negative hematology ROS (+)   Anesthesia Other Findings Past Medical History: No date: Anemia No date: Asthma No date: Diverticulitis No date: GERD (gastroesophageal reflux disease) No date: Vertigo  Past Surgical History: No date: ABDOMINAL HYSTERECTOMY No date: TONSILLECTOMY No date: WISDOM TOOTH EXTRACTION  BMI    Body Mass Index:  28.28 kg/m      Reproductive/Obstetrics negative OB ROS                             Anesthesia Physical Anesthesia Plan  ASA: III  Anesthesia Plan: General   Post-op Pain Management:    Induction:   Airway Management Planned:   Additional Equipment:   Intra-op Plan:   Post-operative Plan:   Informed Consent: I have reviewed the patients History and Physical, chart, labs and discussed the procedure including the risks, benefits and alternatives for the proposed anesthesia with the patient or authorized  representative who has indicated his/her understanding and acceptance.   Dental Advisory Given  Plan Discussed with: Anesthesiologist, CRNA and Surgeon  Anesthesia Plan Comments:         Anesthesia Quick Evaluation

## 2016-03-15 NOTE — Transfer of Care (Signed)
Immediate Anesthesia Transfer of Care Note  Patient: Yolanda Barker  Procedure(s) Performed: Procedure(s): COLONOSCOPY WITH PROPOFOL (N/A)  Patient Location: PACU  Anesthesia Type:General  Level of Consciousness: sedated  Airway & Oxygen Therapy: Patient Spontanous Breathing and Patient connected to nasal cannula oxygen  Post-op Assessment: Report given to RN and Post -op Vital signs reviewed and stable  Post vital signs: Reviewed and stable  Last Vitals:  Vitals:   03/15/16 1224 03/15/16 1530  BP: (!) 158/84   Pulse: 61 62  Resp: 20   Temp: (!) 35.8 C (!) 35.7 C    Last Pain:  Vitals:   03/15/16 1530  TempSrc: Tympanic         Complications: No apparent anesthesia complications

## 2016-03-15 NOTE — Anesthesia Postprocedure Evaluation (Signed)
Anesthesia Post Note  Patient: Yolanda Barker  Procedure(s) Performed: Procedure(s) (LRB): COLONOSCOPY WITH PROPOFOL (N/A)  Patient location during evaluation: Endoscopy Anesthesia Type: General Level of consciousness: awake and alert Pain management: pain level controlled Vital Signs Assessment: post-procedure vital signs reviewed and stable Respiratory status: spontaneous breathing, nonlabored ventilation, respiratory function stable and patient connected to nasal cannula oxygen Cardiovascular status: blood pressure returned to baseline and stable Postop Assessment: no signs of nausea or vomiting Anesthetic complications: no     Last Vitals:  Vitals:   03/15/16 1600 03/15/16 1610  BP: (!) 131/92 (!) 152/72  Pulse: (!) 103 (!) 52  Resp: 19 13  Temp:      Last Pain:  Vitals:   03/15/16 1530  TempSrc: Tympanic                 Tammatha Cobb S

## 2016-03-16 ENCOUNTER — Encounter: Payer: Self-pay | Admitting: Gastroenterology

## 2016-03-22 ENCOUNTER — Other Ambulatory Visit: Payer: Self-pay | Admitting: Gastroenterology

## 2016-03-22 DIAGNOSIS — Q438 Other specified congenital malformations of intestine: Secondary | ICD-10-CM

## 2016-04-06 ENCOUNTER — Ambulatory Visit
Admission: RE | Admit: 2016-04-06 | Discharge: 2016-04-06 | Disposition: A | Discharge status: Home / Self Care | Payer: BLUE CROSS/BLUE SHIELD | Source: Ambulatory Visit | Attending: Gastroenterology | Admitting: Gastroenterology

## 2016-04-06 ENCOUNTER — Other Ambulatory Visit: Payer: Self-pay | Admitting: Gastroenterology

## 2016-04-06 DIAGNOSIS — K5730 Diverticulosis of large intestine without perforation or abscess without bleeding: Secondary | ICD-10-CM | POA: Insufficient documentation

## 2016-04-06 DIAGNOSIS — Q438 Other specified congenital malformations of intestine: Secondary | ICD-10-CM

## 2016-04-06 DIAGNOSIS — K6389 Other specified diseases of intestine: Secondary | ICD-10-CM | POA: Insufficient documentation

## 2016-04-07 ENCOUNTER — Other Ambulatory Visit: Payer: Self-pay | Admitting: Gastroenterology

## 2016-04-07 DIAGNOSIS — Q438 Other specified congenital malformations of intestine: Secondary | ICD-10-CM

## 2017-03-03 ENCOUNTER — Other Ambulatory Visit: Payer: Self-pay | Admitting: Family Medicine

## 2017-03-03 DIAGNOSIS — Z1231 Encounter for screening mammogram for malignant neoplasm of breast: Secondary | ICD-10-CM

## 2017-03-17 ENCOUNTER — Other Ambulatory Visit: Payer: Self-pay

## 2017-03-17 DIAGNOSIS — K5792 Diverticulitis of intestine, part unspecified, without perforation or abscess without bleeding: Secondary | ICD-10-CM | POA: Diagnosis not present

## 2017-03-17 DIAGNOSIS — J45909 Unspecified asthma, uncomplicated: Secondary | ICD-10-CM | POA: Diagnosis not present

## 2017-03-17 DIAGNOSIS — R1032 Left lower quadrant pain: Secondary | ICD-10-CM | POA: Diagnosis present

## 2017-03-17 DIAGNOSIS — Z79899 Other long term (current) drug therapy: Secondary | ICD-10-CM | POA: Insufficient documentation

## 2017-03-17 DIAGNOSIS — Z87891 Personal history of nicotine dependence: Secondary | ICD-10-CM | POA: Insufficient documentation

## 2017-03-17 LAB — LIPASE, BLOOD: Lipase: 31 U/L (ref 11–51)

## 2017-03-17 LAB — COMPREHENSIVE METABOLIC PANEL
ALBUMIN: 3.8 g/dL (ref 3.5–5.0)
ALK PHOS: 70 U/L (ref 38–126)
ALT: 21 U/L (ref 14–54)
ANION GAP: 9 (ref 5–15)
AST: 23 U/L (ref 15–41)
BUN: 14 mg/dL (ref 6–20)
CO2: 27 mmol/L (ref 22–32)
Calcium: 9.2 mg/dL (ref 8.9–10.3)
Chloride: 105 mmol/L (ref 101–111)
Creatinine, Ser: 0.82 mg/dL (ref 0.44–1.00)
GFR calc Af Amer: >60 mL/min (ref >60)
GFR calc non Af Amer: >60 mL/min (ref >60)
GLUCOSE: 94 mg/dL (ref 65–99)
POTASSIUM: 4.1 mmol/L (ref 3.5–5.1)
SODIUM: 141 mmol/L (ref 135–145)
Total Bilirubin: 0.4 mg/dL (ref 0.3–1.2)
Total Protein: 6.8 g/dL (ref 6.5–8.1)

## 2017-03-17 LAB — CBC
HEMATOCRIT: 37.2 % (ref 35.0–47.0)
HEMOGLOBIN: 12.1 g/dL (ref 12.0–16.0)
MCH: 27.1 pg (ref 26.0–34.0)
MCHC: 32.5 g/dL (ref 32.0–36.0)
MCV: 83.4 fL (ref 80.0–100.0)
Platelets: 244 10*3/uL (ref 150–440)
RBC: 4.46 MIL/uL (ref 3.80–5.20)
RDW: 13.1 % (ref 11.5–14.5)
WBC: 5.3 10*3/uL (ref 3.6–11.0)

## 2017-03-17 NOTE — ED Triage Notes (Addendum)
Patient reports having left lower sided abdominal pain for couple days, denies nausea, vomiting.  Reports history of diverticulitis.

## 2017-03-18 ENCOUNTER — Emergency Department
Admission: EM | Admit: 2017-03-18 | Discharge: 2017-03-18 | Disposition: A | Discharge status: Home / Self Care | Payer: BLUE CROSS/BLUE SHIELD | Attending: Emergency Medicine | Admitting: Emergency Medicine

## 2017-03-18 DIAGNOSIS — K5792 Diverticulitis of intestine, part unspecified, without perforation or abscess without bleeding: Secondary | ICD-10-CM

## 2017-03-18 MED ORDER — CIPROFLOXACIN HCL 500 MG PO TABS: 500.0000 mg | ORAL_TABLET | Freq: Two times a day (BID) | ORAL | 0 refills | Status: AC

## 2017-03-18 MED ORDER — METRONIDAZOLE 500 MG PO TABS: 500.0000 mg | ORAL_TABLET | Freq: Three times a day (TID) | ORAL | 0 refills | Status: AC

## 2017-03-18 MED ORDER — IBUPROFEN 600 MG PO TABS: 600.0000 mg | ORAL_TABLET | Freq: Three times a day (TID) | ORAL | 0 refills | Status: DC | PRN

## 2017-03-18 NOTE — Discharge Instructions (Signed)
Please take both of your antibiotics as prescribed and follow-up with your primary care physician within 1 week for recheck.  Return to the emergency department sooner for any new or worsening symptoms such as worsening pain, fevers, chills, or for any other issues whatsoever.  It was a pleasure to take care of you today, and thank you for coming to our emergency department.  If you have any questions or concerns before leaving please ask the nurse to grab me and I'm more than happy to go through your aftercare instructions again.  If you were prescribed any opioid pain medication today such as Norco, Vicodin, Percocet, morphine, hydrocodone, or oxycodone please make sure you do not drive when you are taking this medication as it can alter your ability to drive safely.  If you have any concerns once you are home that you are not improving or are in fact getting worse before you can make it to your follow-up appointment, please do not hesitate to call 911 and come back for further evaluation.  Darel Hong, MD  Results for orders placed or performed during the hospital encounter of 03/18/17  Lipase, blood  Result Value Ref Range   Lipase 31 11 - 51 U/L  Comprehensive metabolic panel  Result Value Ref Range   Sodium 141 135 - 145 mmol/L   Potassium 4.1 3.5 - 5.1 mmol/L   Chloride 105 101 - 111 mmol/L   CO2 27 22 - 32 mmol/L   Glucose, Bld 94 65 - 99 mg/dL   BUN 14 6 - 20 mg/dL   Creatinine, Ser 0.82 0.44 - 1.00 mg/dL   Calcium 9.2 8.9 - 10.3 mg/dL   Total Protein 6.8 6.5 - 8.1 g/dL   Albumin 3.8 3.5 - 5.0 g/dL   AST 23 15 - 41 U/L   ALT 21 14 - 54 U/L   Alkaline Phosphatase 70 38 - 126 U/L   Total Bilirubin 0.4 0.3 - 1.2 mg/dL   GFR calc non Af Amer >60 >60 mL/min   GFR calc Af Amer >60 >60 mL/min   Anion gap 9 5 - 15  CBC  Result Value Ref Range   WBC 5.3 3.6 - 11.0 K/uL   RBC 4.46 3.80 - 5.20 MIL/uL   Hemoglobin 12.1 12.0 - 16.0 g/dL   HCT 37.2 35.0 - 47.0 %   MCV 83.4 80.0 -  100.0 fL   MCH 27.1 26.0 - 34.0 pg   MCHC 32.5 32.0 - 36.0 g/dL   RDW 13.1 11.5 - 14.5 %   Platelets 244 150 - 440 K/uL

## 2017-03-18 NOTE — ED Provider Notes (Signed)
[facility] Emergency Department Provider Note  ____________________________________________   First MD Initiated Contact with Patient 03/18/17 0210     (approximate)  I have reviewed the triage vital signs and the nursing notes.   HISTORY  Chief Complaint Abdominal Pain   HPI Yolanda Barker is a 62 y.o. female who self presents the emergency department with roughly 24 hours of mild to moderate cramping intermittent left lower quadrant pain associated with some constipation.  She denies fevers or chills.  She says "I think this is my diverticulitis again".  She has had diverticulitis at least 2 times in her past.  She denies nausea or vomiting.  Her symptoms began insidiously and have been intermittent.  Nothing in particular seems to make them better or worse.  Past Medical History:  Diagnosis Date  . Anemia   . Asthma   . Diverticulitis   . GERD (gastroesophageal reflux disease)   . Vertigo     There are no active problems to display for this patient.   Past Surgical History:  Procedure Laterality Date  . ABDOMINAL HYSTERECTOMY    . COLONOSCOPY WITH PROPOFOL N/A 03/15/2016   Procedure: COLONOSCOPY WITH PROPOFOL;  Surgeon: Lollie Sails, MD;  Location: [facility] ENDOSCOPY;  Service: Endoscopy;  Laterality: N/A;  . TONSILLECTOMY    . WISDOM TOOTH EXTRACTION      Prior to Admission medications   Medication Sig Start Date End Date Taking? Authorizing Provider  acetaminophen (TYLENOL) 500 MG tablet Take 1 tablet by mouth every 6 (six) hours as needed.    [provider]  albuterol (PROVENTIL HFA;VENTOLIN HFA) 108 (90 Base) MCG/ACT inhaler Inhale 2 puffs into the lungs every 6 (six) hours as needed for wheezing or shortness of breath.    [provider]  cetirizine (ZYRTEC) 10 MG tablet Take 10 mg by mouth daily.    [provider]  Cholecalciferol (VITAMIN D-1000 MAX ST) 1000 units tablet Take 1 tablet by mouth daily.     [provider]  ciprofloxacin (CIPRO) 500 MG tablet Take 1 tablet (500 mg total) by mouth 2 (two) times daily for 10 days. 03/18/17 03/28/17  Darel Hong, MD  docusate calcium (SURFAK) 240 MG capsule Take 240 mg by mouth daily.    [provider]  ferrous sulfate 325 (65 FE) MG tablet Take 1 tablet by mouth daily with breakfast.    [provider]  fluticasone (FLONASE ALLERGY RELIEF) 50 MCG/ACT nasal spray Place 2 sprays into both nostrils daily.    [provider]  ibuprofen (ADVIL,MOTRIN) 600 MG tablet Take 1 tablet (600 mg total) by mouth every 8 (eight) hours as needed. 03/18/17   Darel Hong, MD  meclizine (ANTIVERT) 25 MG tablet Take 25 mg by mouth 3 (three) times daily as needed for dizziness.    [provider]  metroNIDAZOLE (FLAGYL) 500 MG tablet Take 1 tablet (500 mg total) by mouth 3 (three) times daily for 10 days. 03/18/17 03/28/17  Darel Hong, MD  omeprazole (PRILOSEC) 20 MG capsule Take 20 mg by mouth 2 (two) times daily before a meal.    [provider]  triamcinolone cream (KENALOG) 0.1 % Apply 1 application topically 2 (two) times daily.    [provider]    Allergies Penicillin g  Family History  Problem Relation Age of Onset  . Bone cancer Mother   . Stroke Father   . Breast cancer Neg Hx     Social History Social  History   Tobacco Use  . Smoking status: Former Research scientist (life sciences)  . Smokeless tobacco: Never Used  Substance Use Topics  . Alcohol use: No  . Drug use: No    Review of Systems Constitutional: No fever/chills Eyes: No visual changes. ENT: No sore throat. Cardiovascular: Denies chest pain. Respiratory: Denies shortness of breath. Gastrointestinal: Positive for abdominal pain.  No nausea, no vomiting.  No diarrhea.  Positive for constipation. Genitourinary: Negative for dysuria. Musculoskeletal: Negative for back pain. Skin: Negative for rash. Neurological: Negative for headaches, focal  weakness or numbness.   ____________________________________________   PHYSICAL EXAM:  VITAL SIGNS: ED Triage Vitals  Enc Vitals Group     BP 03/17/17 2157 127/75     Pulse Rate 03/17/17 2157 65     Resp --      Temp 03/17/17 2157 98 F (36.7 C)     Temp Source 03/17/17 2157 Oral     SpO2 03/17/17 2157 96 %     Weight 03/17/17 2158 150 lb (68 kg)     Height 03/17/17 2158 4\' 11"  (1.499 m)     Head Circumference --      Peak Flow --      Pain Score 03/17/17 2157 8     Pain Loc --      Pain Edu? --      Excl. in [place]? --     Constitutional: Alert and oriented x4 well-appearing nontoxic no diaphoresis speaks in full clear sentences Eyes: PERRL EOMI. Head: Atraumatic. Nose: No congestion/rhinnorhea. Mouth/Throat: No trismus Neck: No stridor.   Cardiovascular: Normal rate, regular rhythm. Grossly normal heart sounds.  Good peripheral circulation. Respiratory: Normal respiratory effort.  No retractions. Lungs CTAB and moving good air Gastrointestinal: Soft nondistended mild left lower quadrant tenderness with no rebound or guarding no peritonitis negative Rovsing's Musculoskeletal: No lower extremity edema   Neurologic:  Normal speech and language. No gross focal neurologic deficits are appreciated. Skin:  Skin is warm, dry and intact. No rash noted. Psychiatric: Mood and affect are normal. Speech and behavior are normal.    ____________________________________________   DIFFERENTIAL includes but not limited to  Diverticulitis, perforation, abscess, appendicitis, nephrolithiasis ____________________________________________   LABS (all labs ordered are listed, but only abnormal results are displayed)  Labs Reviewed  LIPASE, BLOOD  COMPREHENSIVE METABOLIC PANEL  CBC    Lab work reviewed by me with no acute  disease __________________________________________  EKG   ____________________________________________  RADIOLOGY   ____________________________________________   PROCEDURES  Procedure(s) performed: no  Procedures  Critical Care performed: no  Observation: no ____________________________________________   INITIAL IMPRESSION / ASSESSMENT AND PLAN / ED COURSE  Pertinent labs & imaging results that were available during my care of the patient were reviewed by me and considered in my medical decision making (see chart for details).  Patient arrives very well-appearing hemodynamically stable with non-peritoneal abdomen.  I offered her a CT scan today versus presumptive treatment the patient states she does not want another CT scan and she would prefer just to be treated for diverticulitis which I think is entirely reasonable.  Do not suspect perforation or abscess at this time.  Strict return precautions have been given to the patient verbalizes understanding and agree with plan.      ____________________________________________   FINAL CLINICAL IMPRESSION(S) / ED DIAGNOSES  Final diagnoses:  Diverticulitis      NEW MEDICATIONS STARTED DURING THIS VISIT:  This SmartLink is deprecated. Use AVSMEDLIST instead to display the medication list  for a patient.   Note:  This document was prepared using Dragon voice recognition software and may include unintentional dictation errors.     Darel Hong, MD 03/18/17 [phone]

## 2017-03-28 ENCOUNTER — Ambulatory Visit
Admission: RE | Admit: 2017-03-28 | Discharge: 2017-03-28 | Disposition: A | Discharge status: Home / Self Care | Payer: BLUE CROSS/BLUE SHIELD | Source: Ambulatory Visit | Attending: Family Medicine | Admitting: Family Medicine

## 2017-03-28 DIAGNOSIS — Z1231 Encounter for screening mammogram for malignant neoplasm of breast: Secondary | ICD-10-CM

## 2017-06-27 ENCOUNTER — Emergency Department
Admission: EM | Admit: 2017-06-27 | Discharge: 2017-06-27 | Disposition: A | Discharge status: Home / Self Care | Payer: BLUE CROSS/BLUE SHIELD | Attending: Emergency Medicine | Admitting: Emergency Medicine

## 2017-06-27 ENCOUNTER — Encounter: Payer: Self-pay | Admitting: Emergency Medicine

## 2017-06-27 ENCOUNTER — Other Ambulatory Visit: Payer: Self-pay

## 2017-06-27 DIAGNOSIS — J45909 Unspecified asthma, uncomplicated: Secondary | ICD-10-CM | POA: Diagnosis not present

## 2017-06-27 DIAGNOSIS — Z87891 Personal history of nicotine dependence: Secondary | ICD-10-CM | POA: Insufficient documentation

## 2017-06-27 DIAGNOSIS — R42 Dizziness and giddiness: Secondary | ICD-10-CM | POA: Insufficient documentation

## 2017-06-27 DIAGNOSIS — Z79899 Other long term (current) drug therapy: Secondary | ICD-10-CM | POA: Diagnosis not present

## 2017-06-27 LAB — BASIC METABOLIC PANEL
ANION GAP: 7 (ref 5–15)
BUN: 16 mg/dL (ref 6–20)
CALCIUM: 9.6 mg/dL (ref 8.9–10.3)
CO2: 30 mmol/L (ref 22–32)
CREATININE: 0.89 mg/dL (ref 0.44–1.00)
Chloride: 104 mmol/L (ref 101–111)
GLUCOSE: 108 mg/dL — AB (ref 65–99)
Potassium: 3.9 mmol/L (ref 3.5–5.1)
Sodium: 141 mmol/L (ref 135–145)

## 2017-06-27 LAB — CBC
HCT: 36 % (ref 35.0–47.0)
Hemoglobin: 11.8 g/dL — ABNORMAL LOW (ref 12.0–16.0)
MCH: 27.3 pg (ref 26.0–34.0)
MCHC: 32.9 g/dL (ref 32.0–36.0)
MCV: 82.9 fL (ref 80.0–100.0)
PLATELETS: 251 10*3/uL (ref 150–440)
RBC: 4.34 MIL/uL (ref 3.80–5.20)
RDW: 13.6 % (ref 11.5–14.5)
WBC: 4.6 10*3/uL (ref 3.6–11.0)

## 2017-06-27 LAB — URINALYSIS, COMPLETE (UACMP) WITH MICROSCOPIC
BILIRUBIN URINE: NEGATIVE
Glucose, UA: NEGATIVE mg/dL
HGB URINE DIPSTICK: NEGATIVE
Ketones, ur: NEGATIVE mg/dL
LEUKOCYTES UA: NEGATIVE
NITRITE: NEGATIVE
Protein, ur: NEGATIVE mg/dL
RBC / HPF: NONE SEEN RBC/hpf (ref 0–5)
SPECIFIC GRAVITY, URINE: 1.002 — AB (ref 1.005–1.030)
pH: 7 (ref 5.0–8.0)

## 2017-06-27 LAB — TROPONIN I

## 2017-06-27 NOTE — ED Provider Notes (Signed)
[facility] Emergency Department Provider Note   ____________________________________________   First MD Initiated Contact with Patient 06/27/17 [phone]     (approximate)  I have reviewed the triage vital signs and the nursing notes.   HISTORY  Chief Complaint Dizziness    HPI Yolanda Barker is a 62 y.o. female who presents to the ED from work with a chief complaint of vertigo.  Patient has a history of vertigo, works third shift at rehab facility.  States she was looking down taking it patient's blood pressure when she had sudden onset of dizziness which she describes as motion sensation.  She took her meclizine and sat for a few minutes.  Her blood pressure was high at work and she decided to proceed to the ED for further evaluation.  Denies associated slurred speech, altered mentation, facial droop, extremity numbness, numbness or tingling.  Denies recent fever, chills, chest pain, shortness of breath, headache, neck pain, vision changes, abdominal pain, nausea, vomiting, lightheadedness.  Denies recent travel or trauma.  At this time her medicines have kicked in and vertigo has completely resolved.   Past Medical History:  Diagnosis Date  . Anemia   . Asthma   . Diverticulitis   . GERD (gastroesophageal reflux disease)   . Vertigo     There are no active problems to display for this patient.   Past Surgical History:  Procedure Laterality Date  . ABDOMINAL HYSTERECTOMY    . COLONOSCOPY WITH PROPOFOL N/A 03/15/2016   Procedure: COLONOSCOPY WITH PROPOFOL;  Surgeon: Lollie Sails, MD;  Location: [facility] ENDOSCOPY;  Service: Endoscopy;  Laterality: N/A;  . TONSILLECTOMY    . WISDOM TOOTH EXTRACTION      Prior to Admission medications   Medication Sig Start Date End Date Taking? Authorizing Provider  acetaminophen (TYLENOL) 500 MG tablet Take 1 tablet by mouth every 6 (six) hours as needed.    [provider]  albuterol (PROVENTIL HFA;VENTOLIN  HFA) 108 (90 Base) MCG/ACT inhaler Inhale 2 puffs into the lungs every 6 (six) hours as needed for wheezing or shortness of breath.    [provider]  cetirizine (ZYRTEC) 10 MG tablet Take 10 mg by mouth daily.    [provider]  Cholecalciferol (VITAMIN D-1000 MAX ST) 1000 units tablet Take 1 tablet by mouth daily.    [provider]  docusate calcium (SURFAK) 240 MG capsule Take 240 mg by mouth daily.    [provider]  ferrous sulfate 325 (65 FE) MG tablet Take 1 tablet by mouth daily with breakfast.    [provider]  fluticasone (FLONASE ALLERGY RELIEF) 50 MCG/ACT nasal spray Place 2 sprays into both nostrils daily.    [provider]  ibuprofen (ADVIL,MOTRIN) 600 MG tablet Take 1 tablet (600 mg total) by mouth every 8 (eight) hours as needed. 03/18/17   Darel Hong, MD  meclizine (ANTIVERT) 25 MG tablet Take 25 mg by mouth 3 (three) times daily as needed for dizziness.    [provider]  omeprazole (PRILOSEC) 20 MG capsule Take 20 mg by mouth 2 (two) times daily before a meal.    [provider]  triamcinolone cream (KENALOG) 0.1 % Apply 1 application topically 2 (two) times daily.    [provider]    Allergies Penicillin g  Family History  Problem Relation Age of Onset  . Bone cancer Mother   . Stroke Father   . Breast cancer Neg Hx  Social History Social History   Tobacco Use  . Smoking status: Former Research scientist (life sciences)  . Smokeless tobacco: Never Used  Substance Use Topics  . Alcohol use: No  . Drug use: No    Review of Systems  Constitutional: No fever/chills. Eyes: No visual changes. ENT: No sore throat. Cardiovascular: Denies chest pain. Respiratory: Denies shortness of breath. Gastrointestinal: No abdominal pain.  No nausea, no vomiting.  No diarrhea.  No constipation. Genitourinary: Negative for dysuria. Musculoskeletal: Negative for back pain. Skin: Negative for  rash. Neurological: Positive for dizziness.  Negative for headaches, focal weakness or numbness.   ____________________________________________   PHYSICAL EXAM:  VITAL SIGNS: ED Triage Vitals [06/27/17 0030]  Enc Vitals Group     BP (!) 158/107     Pulse Rate 66     Resp 18     Temp 98.7 F (37.1 C)     Temp Source Oral     SpO2 99 %     Weight 150 lb (68 kg)     Height 4\' 11"  (1.499 m)     Head Circumference      Peak Flow      Pain Score 0     Pain Loc      Pain Edu?      Excl. in [place]?     Constitutional: Alert and oriented. Well appearing and in no acute distress. Eyes: Conjunctivae are normal. PERRL. EOMI. Head: Atraumatic. Nose: No congestion/rhinnorhea. Mouth/Throat: Mucous membranes are moist.  Oropharynx non-erythematous. Neck: No stridor.  No carotid bruits. Cardiovascular: Normal rate, regular rhythm. Grossly normal heart sounds.  Good peripheral circulation. Respiratory: Normal respiratory effort.  No retractions. Lungs CTAB. Gastrointestinal: Soft and nontender. No distention. No abdominal bruits. No CVA tenderness. Musculoskeletal: No lower extremity tenderness nor edema.  No joint effusions. Neurologic:  Normal speech and language. No gross focal neurologic deficits are appreciated. No gait instability. Skin:  Skin is warm, dry and intact. No rash noted. Psychiatric: Mood and affect are normal. Speech and behavior are normal.  ____________________________________________   LABS (all labs ordered are listed, but only abnormal results are displayed)  Labs Reviewed  BASIC METABOLIC PANEL - Abnormal; Notable for the following components:      Result Value   Glucose, Bld 108 (*)    All other components within normal limits  CBC - Abnormal; Notable for the following components:   Hemoglobin 11.8 (*)    All other components within normal limits  URINALYSIS, COMPLETE (UACMP) WITH MICROSCOPIC - Abnormal; Notable for the following components:   Color, Urine  COLORLESS (*)    APPearance CLEAR (*)    Specific Gravity, Urine 1.002 (*)    Bacteria, UA RARE (*)    Squamous Epithelial / LPF 0-5 (*)    All other components within normal limits  TROPONIN I   ____________________________________________  EKG  ED ECG REPORT I, SUNG,JADE J, the attending physician, personally viewed and interpreted this ECG.   Date: 06/27/2017  EKG Time: 0036  Rate: 61  Rhythm: normal EKG, normal sinus rhythm  Axis: Normal  Intervals:none  ST&T Change: Nonspecific  ____________________________________________  RADIOLOGY  ED MD interpretation: None  Official radiology report(s): No results found.  ____________________________________________   PROCEDURES  Procedure(s) performed: None  Procedures  Critical Care performed: No  ____________________________________________   INITIAL IMPRESSION / ASSESSMENT AND PLAN / ED COURSE  As part of my medical decision making, I reviewed the following data within the [facility] notes reviewed  and incorporated, Labs reviewed, EKG interpreted, Old chart reviewed and Notes from prior ED visits   62 year old female with history of vertigo who presents with sudden onset vertigo while at work.  Differential diagnosis includes but is not limited to vertigo, CVA, TIA, SAH, etc.  No focal neurological deficits on my examination.  Laboratory and EKG unremarkable.  Patient symptoms have completely resolved after she took her meclizine prior to arrival.  Strict return precautions given.  Patient verbalizes understanding agrees with plan of care.      ____________________________________________   FINAL CLINICAL IMPRESSION(S) / ED DIAGNOSES  Final diagnoses:  Vertigo     ED Discharge Orders    None       Note:  This document was prepared using Dragon voice recognition software and may include unintentional dictation errors.    Paulette Blanch, MD 06/27/17 [phone]

## 2017-06-27 NOTE — ED Triage Notes (Addendum)
Patient ambulatory to triage with steady gait, without difficulty or distress noted; pt reports vertigo accomp by nausea that was worse upon going to work tonight; st hx of same and takes meclizine as needed--ds taken PTA

## 2017-06-27 NOTE — Discharge Instructions (Addendum)
Continue to take your medicines as needed for vertigo.  Return to the ER for worsening symptoms, persistent vomiting, lethargy or other concerns.

## 2017-06-27 NOTE — ED Notes (Signed)
Patient ambulated with no difficulty or distress noted to stat desk to inquire about wait times. This RN discussed this information with patient. Patient verbalized understanding. Patient ambulated back to her seat, with steady gait.

## 2017-10-13 ENCOUNTER — Other Ambulatory Visit: Payer: Self-pay | Admitting: Physician Assistant

## 2017-10-13 DIAGNOSIS — M7541 Impingement syndrome of right shoulder: Secondary | ICD-10-CM

## 2017-10-20 ENCOUNTER — Ambulatory Visit
Admission: RE | Admit: 2017-10-20 | Discharge: 2017-10-20 | Disposition: A | Discharge status: Home / Self Care | Payer: BLUE CROSS/BLUE SHIELD | Source: Ambulatory Visit | Attending: Physician Assistant | Admitting: Physician Assistant

## 2017-10-20 DIAGNOSIS — M7551 Bursitis of right shoulder: Secondary | ICD-10-CM | POA: Insufficient documentation

## 2017-10-20 DIAGNOSIS — M7541 Impingement syndrome of right shoulder: Secondary | ICD-10-CM | POA: Insufficient documentation

## 2017-10-20 DIAGNOSIS — M75101 Unspecified rotator cuff tear or rupture of right shoulder, not specified as traumatic: Secondary | ICD-10-CM | POA: Insufficient documentation

## 2017-10-20 DIAGNOSIS — M25411 Effusion, right shoulder: Secondary | ICD-10-CM | POA: Diagnosis not present

## 2017-10-30 DIAGNOSIS — M75111 Incomplete rotator cuff tear or rupture of right shoulder, not specified as traumatic: Secondary | ICD-10-CM | POA: Insufficient documentation

## 2017-11-07 ENCOUNTER — Other Ambulatory Visit: Payer: Self-pay

## 2017-11-07 ENCOUNTER — Encounter
Admission: RE | Admit: 2017-11-07 | Discharge: 2017-11-07 | Disposition: A | Discharge status: Home / Self Care | Payer: BLUE CROSS/BLUE SHIELD | Source: Ambulatory Visit | Attending: Surgery | Admitting: Surgery

## 2017-11-07 HISTORY — DX: Unspecified osteoarthritis, unspecified site: M19.90

## 2017-11-07 NOTE — Patient Instructions (Signed)
Your procedure is scheduled on: 11-14-17 TUESDAY Report to Same Day Surgery 2nd floor medical mall [facility] Entrance-take elevator on left to 2nd floor.  Check in with surgery information desk.) To find out your arrival time please call [phone] between 1PM - 3PM on 11-10-17 FRIDAY  Remember: Instructions that are not followed completely may result in serious medical risk, up to and including death, or upon the discretion of your surgeon and anesthesiologist your surgery may need to be rescheduled.    _x___ 1. Do not eat food after midnight the night before your procedure. NO GUM OR CANDY AFTER MIDNIGHT.  You may drink clear liquids up to 2 hours before you are scheduled to arrive at the hospital for your procedure.  Do not drink clear liquids within 2 hours of your scheduled arrival to the hospital.  Clear liquids include  --Water or Apple juice without pulp  --Clear carbohydrate beverage such as ClearFast or Gatorade  --Black Coffee or Clear Tea (No milk, no creamers, do not add anything to the coffee or Tea   ____Ensure clear carbohydrate drink on the way to the hospital for bariatric patients  ____Ensure clear carbohydrate drink 3 hours before surgery for Dr Dwyane Luo patients if physician instructed.      __x__ 2. No Alcohol for 24 hours before or after surgery.   __x__3. No Smoking or e-cigarettes for 24 prior to surgery.  Do not use any chewable tobacco products for at least 6 hour prior to surgery   ____  4. Bring all medications with you on the day of surgery if instructed.    __x__ 5. Notify your doctor if there is any change in your medical condition     (cold, fever, infections).    x___6. On the morning of surgery brush your teeth with toothpaste and water.  You may rinse your mouth with mouth wash if you wish.  Do not swallow any toothpaste or mouthwash.   Do not wear jewelry, make-up, hairpins, clips or nail polish.  Do not wear lotions, powders, or perfumes.  You may wear deodorant.  Do not shave 48 hours prior to surgery. Men may shave face and neck.  Do not bring valuables to the hospital.    [facility] is not responsible for any belongings or valuables.               Contacts, dentures or bridgework may not be worn into surgery.  Leave your suitcase in the car. After surgery it may be brought to your room.  For patients admitted to the hospital, discharge time is determined by your treatment team.  _  Patients discharged the day of surgery will not be allowed to drive home.  You will need someone to drive you home and stay with you the night of your procedure.    Please read over the following fact sheets that you were given:   [facility] Preparing for Surgery   _x___ TAKE THE FOLLOWING MEDICATION THE MORNING OF SURGERY WITH A SMALL SIP OF WATER. These include:  1. PRILOSEC (OMEPRAZOLE)  2. TAKE A PRILOSEC THE NIGHT BEFORE YOUR SURGERY  3.  4.  5.  6.  ____Fleets enema or Magnesium Citrate as directed.   _x___ Use CHG Soap or sage wipes as directed on instruction sheet   ____ Use inhalers on the day of surgery and bring to hospital day of surgery  ____ Stop Metformin and Janumet 2 days prior to surgery.  ____ Take 1/2 of usual insulin dose the night before surgery and none on the morning surgery.   ____ Follow recommendations from Cardiologist, Pulmonologist or PCP regarding stopping Aspirin, Coumadin, Plavix ,Eliquis, Effient, or Pradaxa, and Pletal.  X____Stop Anti-inflammatories such as Advil, Aleve, Ibuprofen, Motrin, Naproxen, Naprosyn, Goodies powders or aspirin products NOW-OK to take Tylenol    ____ Stop supplements until after surgery.    ____ Bring C-Pap to the hospital.

## 2017-11-08 ENCOUNTER — Encounter
Admission: RE | Admit: 2017-11-08 | Discharge: 2017-11-08 | Disposition: A | Discharge status: Home / Self Care | Payer: BLUE CROSS/BLUE SHIELD | Source: Ambulatory Visit | Attending: Surgery | Admitting: Surgery

## 2017-11-08 DIAGNOSIS — M75101 Unspecified rotator cuff tear or rupture of right shoulder, not specified as traumatic: Secondary | ICD-10-CM | POA: Diagnosis not present

## 2017-11-08 DIAGNOSIS — Z01818 Encounter for other preprocedural examination: Secondary | ICD-10-CM | POA: Diagnosis present

## 2017-11-08 LAB — HEMOGLOBIN: Hemoglobin: 11.7 g/dL — ABNORMAL LOW (ref 12.0–16.0)

## 2017-11-13 MED ORDER — CLINDAMYCIN PHOSPHATE 900 MG/50ML IV SOLN
900.0000 mg | Freq: Once | INTRAVENOUS | Status: AC
  Administered 2017-11-14: 900 mg via INTRAVENOUS

## 2017-11-14 ENCOUNTER — Other Ambulatory Visit: Payer: Self-pay

## 2017-11-14 ENCOUNTER — Ambulatory Visit: Payer: BLUE CROSS/BLUE SHIELD | Admitting: Anesthesiology

## 2017-11-14 ENCOUNTER — Ambulatory Visit
Admission: RE | Admit: 2017-11-14 | Discharge: 2017-11-14 | Disposition: A | Discharge status: Home / Self Care | Payer: BLUE CROSS/BLUE SHIELD | Source: Ambulatory Visit | Attending: Surgery | Admitting: Surgery

## 2017-11-14 ENCOUNTER — Encounter
Admission: RE | Disposition: A | Discharge status: Home / Self Care | Payer: Self-pay | Source: Ambulatory Visit | Attending: Surgery

## 2017-11-14 DIAGNOSIS — M7541 Impingement syndrome of right shoulder: Secondary | ICD-10-CM | POA: Insufficient documentation

## 2017-11-14 DIAGNOSIS — Z823 Family history of stroke: Secondary | ICD-10-CM | POA: Insufficient documentation

## 2017-11-14 DIAGNOSIS — Z880 Allergy status to penicillin: Secondary | ICD-10-CM | POA: Diagnosis not present

## 2017-11-14 DIAGNOSIS — Z90710 Acquired absence of both cervix and uterus: Secondary | ICD-10-CM | POA: Insufficient documentation

## 2017-11-14 DIAGNOSIS — Z808 Family history of malignant neoplasm of other organs or systems: Secondary | ICD-10-CM | POA: Insufficient documentation

## 2017-11-14 DIAGNOSIS — Z87891 Personal history of nicotine dependence: Secondary | ICD-10-CM | POA: Insufficient documentation

## 2017-11-14 DIAGNOSIS — M7581 Other shoulder lesions, right shoulder: Secondary | ICD-10-CM | POA: Insufficient documentation

## 2017-11-14 DIAGNOSIS — M75111 Incomplete rotator cuff tear or rupture of right shoulder, not specified as traumatic: Secondary | ICD-10-CM | POA: Diagnosis not present

## 2017-11-14 DIAGNOSIS — D649 Anemia, unspecified: Secondary | ICD-10-CM | POA: Diagnosis not present

## 2017-11-14 DIAGNOSIS — Z79899 Other long term (current) drug therapy: Secondary | ICD-10-CM | POA: Diagnosis not present

## 2017-11-14 DIAGNOSIS — R42 Dizziness and giddiness: Secondary | ICD-10-CM | POA: Insufficient documentation

## 2017-11-14 HISTORY — PX: SHOULDER ARTHROSCOPY WITH OPEN ROTATOR CUFF REPAIR: SHX6092

## 2017-11-14 SURGERY — ARTHROSCOPY, SHOULDER WITH REPAIR, ROTATOR CUFF, OPEN
Anesthesia: Regional | Laterality: Right

## 2017-11-14 MED ORDER — OXYCODONE HCL 5 MG/5ML PO SOLN: 5.0000 mg | Freq: Once | ORAL | Status: DC | PRN

## 2017-11-14 MED ORDER — LACTATED RINGERS IV SOLN
INTRAVENOUS | Status: DC
  Administered 2017-11-14: 09:00:00 via INTRAVENOUS

## 2017-11-14 MED ORDER — OXYCODONE HCL 5 MG PO TABS: 5.0000 mg | ORAL_TABLET | ORAL | Status: DC | PRN

## 2017-11-14 MED ORDER — PROMETHAZINE HCL 25 MG/ML IJ SOLN: 6.2500 mg | INTRAMUSCULAR | Status: DC | PRN

## 2017-11-14 MED ORDER — BUPIVACAINE LIPOSOME 1.3 % IJ SUSP
INTRAMUSCULAR | Status: DC | PRN
  Administered 2017-11-14 (×4): 5 mL

## 2017-11-14 MED ORDER — PROPOFOL 10 MG/ML IV BOLUS
INTRAVENOUS | Status: DC | PRN
  Administered 2017-11-14: 160 mg via INTRAVENOUS
  Administered 2017-11-14: 40 mg via INTRAVENOUS

## 2017-11-14 MED ORDER — LIDOCAINE HCL (PF) 1 % IJ SOLN
INTRAMUSCULAR | Status: AC
  Filled 2017-11-14: qty 5

## 2017-11-14 MED ORDER — LIDOCAINE HCL (PF) 1 % IJ SOLN
INTRAMUSCULAR | Status: DC | PRN
  Administered 2017-11-14: 2 mL

## 2017-11-14 MED ORDER — LIDOCAINE HCL (CARDIAC) PF 100 MG/5ML IV SOSY
PREFILLED_SYRINGE | INTRAVENOUS | Status: DC | PRN
  Administered 2017-11-14: 100 mg via INTRAVENOUS

## 2017-11-14 MED ORDER — MIDAZOLAM HCL 2 MG/2ML IJ SOLN
1.0000 mg | Freq: Once | INTRAMUSCULAR | Status: AC
  Administered 2017-11-14: 1 mg via INTRAVENOUS

## 2017-11-14 MED ORDER — SODIUM CHLORIDE 0.9 % IV SOLN
INTRAVENOUS | Status: DC | PRN
  Administered 2017-11-14: 30 ug/min via INTRAVENOUS

## 2017-11-14 MED ORDER — MIDAZOLAM HCL 2 MG/2ML IJ SOLN
0.5000 mg | Freq: Once | INTRAMUSCULAR | Status: AC
  Administered 2017-11-14: 0.5 mg via INTRAVENOUS

## 2017-11-14 MED ORDER — BUPIVACAINE-EPINEPHRINE 0.5% -1:200000 IJ SOLN
INTRAMUSCULAR | Status: DC | PRN
  Administered 2017-11-14: 30 mL

## 2017-11-14 MED ORDER — EPINEPHRINE PF 1 MG/ML IJ SOLN
INTRAMUSCULAR | Status: AC
  Filled 2017-11-14: qty 2

## 2017-11-14 MED ORDER — PROPOFOL 10 MG/ML IV BOLUS
INTRAVENOUS | Status: AC
  Filled 2017-11-14: qty 20

## 2017-11-14 MED ORDER — FENTANYL CITRATE (PF) 100 MCG/2ML IJ SOLN
INTRAMUSCULAR | Status: AC
  Administered 2017-11-14: 25 ug via INTRAVENOUS
  Filled 2017-11-14: qty 2

## 2017-11-14 MED ORDER — MIDAZOLAM HCL 2 MG/2ML IJ SOLN
INTRAMUSCULAR | Status: DC | PRN
  Administered 2017-11-14: 2 mg via INTRAVENOUS

## 2017-11-14 MED ORDER — ONDANSETRON HCL 4 MG PO TABS: 4.0000 mg | ORAL_TABLET | Freq: Four times a day (QID) | ORAL | Status: DC | PRN

## 2017-11-14 MED ORDER — MIDAZOLAM HCL 2 MG/2ML IJ SOLN
INTRAMUSCULAR | Status: AC
  Administered 2017-11-14: 1 mg via INTRAVENOUS
  Filled 2017-11-14: qty 2

## 2017-11-14 MED ORDER — ONDANSETRON HCL 4 MG/2ML IJ SOLN: 4.0000 mg | Freq: Four times a day (QID) | INTRAMUSCULAR | Status: DC | PRN

## 2017-11-14 MED ORDER — FENTANYL CITRATE (PF) 100 MCG/2ML IJ SOLN
25.0000 ug | Freq: Once | INTRAMUSCULAR | Status: AC
  Administered 2017-11-14: 25 ug via INTRAVENOUS

## 2017-11-14 MED ORDER — BUPIVACAINE HCL (PF) 0.5 % IJ SOLN
INTRAMUSCULAR | Status: AC
  Filled 2017-11-14: qty 10

## 2017-11-14 MED ORDER — METOCLOPRAMIDE HCL 10 MG PO TABS: 5.0000 mg | ORAL_TABLET | Freq: Three times a day (TID) | ORAL | Status: DC | PRN

## 2017-11-14 MED ORDER — FENTANYL CITRATE (PF) 250 MCG/5ML IJ SOLN
INTRAMUSCULAR | Status: DC | PRN
  Administered 2017-11-14: 100 ug via INTRAVENOUS
  Administered 2017-11-14: 25 ug via INTRAVENOUS

## 2017-11-14 MED ORDER — ROCURONIUM BROMIDE 100 MG/10ML IV SOLN
INTRAVENOUS | Status: DC | PRN
  Administered 2017-11-14: 70 mg via INTRAVENOUS

## 2017-11-14 MED ORDER — METOCLOPRAMIDE HCL 5 MG/ML IJ SOLN: 5.0000 mg | Freq: Three times a day (TID) | INTRAMUSCULAR | Status: DC | PRN

## 2017-11-14 MED ORDER — OXYCODONE HCL 5 MG PO TABS: 5.0000 mg | ORAL_TABLET | ORAL | 0 refills | Status: DC | PRN

## 2017-11-14 MED ORDER — SUGAMMADEX SODIUM 500 MG/5ML IV SOLN
INTRAVENOUS | Status: DC | PRN
  Administered 2017-11-14: 290 mg via INTRAVENOUS

## 2017-11-14 MED ORDER — ONDANSETRON HCL 4 MG/2ML IJ SOLN
INTRAMUSCULAR | Status: AC
  Filled 2017-11-14: qty 2

## 2017-11-14 MED ORDER — ROCURONIUM BROMIDE 50 MG/5ML IV SOLN
INTRAVENOUS | Status: AC
  Filled 2017-11-14: qty 2

## 2017-11-14 MED ORDER — BUPIVACAINE-EPINEPHRINE (PF) 0.5% -1:200000 IJ SOLN
INTRAMUSCULAR | Status: AC
  Filled 2017-11-14: qty 30

## 2017-11-14 MED ORDER — LACTATED RINGERS IV SOLN
INTRAVENOUS | Status: DC | PRN
  Administered 2017-11-14: 2 mL

## 2017-11-14 MED ORDER — LIDOCAINE HCL (PF) 2 % IJ SOLN
INTRAMUSCULAR | Status: AC
  Filled 2017-11-14: qty 10

## 2017-11-14 MED ORDER — ONDANSETRON HCL 4 MG/2ML IJ SOLN
4.0000 mg | Freq: Once | INTRAMUSCULAR | Status: AC | PRN
  Administered 2017-11-14: 4 mg via INTRAVENOUS

## 2017-11-14 MED ORDER — POTASSIUM CHLORIDE IN NACL 20-0.9 MEQ/L-% IV SOLN
INTRAVENOUS | Status: DC
  Filled 2017-11-14: qty 1000

## 2017-11-14 MED ORDER — MIDAZOLAM HCL 5 MG/5ML IJ SOLN
INTRAMUSCULAR | Status: DC | PRN
  Administered 2017-11-14: 0.5 mg via INTRAVENOUS

## 2017-11-14 MED ORDER — FENTANYL CITRATE (PF) 100 MCG/2ML IJ SOLN
INTRAMUSCULAR | Status: AC
  Filled 2017-11-14: qty 2

## 2017-11-14 MED ORDER — BUPIVACAINE LIPOSOME 1.3 % IJ SUSP
INTRAMUSCULAR | Status: AC
  Filled 2017-11-14: qty 20

## 2017-11-14 MED ORDER — MIDAZOLAM HCL 2 MG/2ML IJ SOLN
INTRAMUSCULAR | Status: AC
  Administered 2017-11-14: 0.5 mg via INTRAVENOUS
  Filled 2017-11-14: qty 2

## 2017-11-14 MED ORDER — BUPIVACAINE HCL (PF) 0.5 % IJ SOLN
INTRAMUSCULAR | Status: DC | PRN
  Administered 2017-11-14 (×2): 5 mL

## 2017-11-14 MED ORDER — OXYCODONE HCL 5 MG PO TABS: 5.0000 mg | ORAL_TABLET | Freq: Once | ORAL | Status: DC | PRN

## 2017-11-14 MED ORDER — CLINDAMYCIN PHOSPHATE 900 MG/50ML IV SOLN
INTRAVENOUS | Status: AC
  Filled 2017-11-14: qty 50

## 2017-11-14 MED ORDER — FENTANYL CITRATE (PF) 100 MCG/2ML IJ SOLN: 25.0000 ug | INTRAMUSCULAR | Status: DC | PRN

## 2017-11-14 SURGICAL SUPPLY — 45 items
ANCHOR JUGGERKNOT WTAP NDL 2.9 (Anchor) ×2 IMPLANT
ANCHOR SUT W/ ORTHOCORD (Anchor) ×1 IMPLANT
ANCHOR TENDON REGENETEN (Staple) ×1 IMPLANT
ANCHORS BONE REGENETEN (Anchor) ×1 IMPLANT
BIT DRILL JUGRKNT W/NDL BIT2.9 (DRILL) IMPLANT
BLADE FULL RADIUS 3.5 (BLADE) ×2 IMPLANT
BUR ACROMIONIZER 4.0 (BURR) ×2 IMPLANT
CANNULA SHAVER 8MMX76MM (CANNULA) ×3 IMPLANT
CHLORAPREP W/TINT 26ML (MISCELLANEOUS) ×2 IMPLANT
COVER MAYO STAND STRL (DRAPES) ×2 IMPLANT
DRAPE IMP U-DRAPE 54X76 (DRAPES) ×4 IMPLANT
DRILL JUGGERKNOT W/NDL BIT 2.9 (DRILL) ×2
ELECT REM PT RETURN 9FT ADLT (ELECTROSURGICAL) ×2
ELECTRODE REM PT RTRN 9FT ADLT (ELECTROSURGICAL) ×1 IMPLANT
GAUZE PETRO XEROFOAM 1X8 (MISCELLANEOUS) ×2 IMPLANT
GAUZE SPONGE 4X4 12PLY STRL (GAUZE/BANDAGES/DRESSINGS) ×2 IMPLANT
GLOVE BIO SURGEON STRL SZ7.5 (GLOVE) ×4 IMPLANT
GLOVE BIO SURGEON STRL SZ8 (GLOVE) ×4 IMPLANT
GLOVE BIOGEL PI IND STRL 8 (GLOVE) ×1 IMPLANT
GLOVE BIOGEL PI INDICATOR 8 (GLOVE) ×1
GLOVE INDICATOR 8.0 STRL GRN (GLOVE) ×2 IMPLANT
GOWN STRL REUS W/ TWL LRG LVL3 (GOWN DISPOSABLE) ×1 IMPLANT
GOWN STRL REUS W/ TWL XL LVL3 (GOWN DISPOSABLE) ×1 IMPLANT
GOWN STRL REUS W/TWL LRG LVL3 (GOWN DISPOSABLE) ×1
GOWN STRL REUS W/TWL XL LVL3 (GOWN DISPOSABLE) ×1
GRASPER SUT 15 45D LOW PRO (SUTURE) ×2 IMPLANT
IMPLANT REGENETEN MEDIUM (Shoulder) ×1 IMPLANT
IV LACTATED RINGER IRRG 3000ML (IV SOLUTION) ×2
IV LR IRRIG 3000ML ARTHROMATIC (IV SOLUTION) ×2 IMPLANT
MANIFOLD NEPTUNE II (INSTRUMENTS) ×2 IMPLANT
MASK FACE SPIDER DISP (MASK) ×2 IMPLANT
MAT ABSORB  FLUID 56X50 GRAY (MISCELLANEOUS) ×1
MAT ABSORB FLUID 56X50 GRAY (MISCELLANEOUS) ×1 IMPLANT
PACK ARTHROSCOPY SHOULDER (MISCELLANEOUS) ×2 IMPLANT
SLING ARM LRG DEEP (SOFTGOODS) ×1 IMPLANT
SLING ULTRA II LG (MISCELLANEOUS) ×1 IMPLANT
STAPLER SKIN PROX 35W (STAPLE) ×2 IMPLANT
STRAP SAFETY 5IN WIDE (MISCELLANEOUS) ×2 IMPLANT
SUT ETHIBOND 0 MO6 C/R (SUTURE) ×2 IMPLANT
SUT VIC AB 2-0 CT1 27 (SUTURE) ×2
SUT VIC AB 2-0 CT1 TAPERPNT 27 (SUTURE) ×2 IMPLANT
TAPE MICROFOAM 4IN (TAPE) ×2 IMPLANT
TUBING ARTHRO INFLOW-ONLY STRL (TUBING) ×2 IMPLANT
TUBING CONNECTING 10 (TUBING) ×2 IMPLANT
WAND HAND CNTRL MULTIVAC 90 (MISCELLANEOUS) ×2 IMPLANT

## 2017-11-14 NOTE — Discharge Instructions (Addendum)
Orthopedic discharge instructions: °Keep dressing dry and intact.  °May shower after dressing changed on post-op day #4 (Saturday).  °Cover staples with Band-Aids after drying off. °Apply ice frequently to shoulder. °Take ibuprofen 600-800 mg TID with meals for 7-10 days, then as necessary. °Take oxycodone as prescribed when needed.  °May supplement with ES Tylenol if necessary. °Keep shoulder immobilizer on at all times except may remove for bathing purposes. °Follow-up in 10-14 days or as scheduled. ° ° °AMBULATORY SURGERY  °DISCHARGE INSTRUCTIONS ° ° °1) The drugs that you were given will stay in your system until tomorrow so for the next 24 hours you should not: ° °A) Drive an automobile °B) Make any legal decisions °C) Drink any alcoholic beverage ° ° °2) You may resume regular meals tomorrow.  Today it is better to start with liquids and gradually work up to solid foods. ° °You may eat anything you prefer, but it is better to start with liquids, then soup and crackers, and gradually work up to solid foods. ° ° °3) Please notify your doctor immediately if you have any unusual bleeding, trouble breathing, redness and pain at the surgery site, drainage, fever, or pain not relieved by medication. ° ° ° °4) Additional Instructions: ° ° ° ° ° ° ° °Please contact your physician with any problems or Same Day Surgery at [phone], Monday through Friday 6 am to 4 pm, or [place] [facility] [place] [facility] number at [phone]. °

## 2017-11-14 NOTE — Anesthesia Preprocedure Evaluation (Addendum)
Anesthesia Evaluation  Patient identified by MRN, date of birth, ID band Patient awake    Reviewed: Allergy & Precautions, H&P , NPO status , Patient's Chart, lab work & pertinent test results  Airway Mallampati: III       Dental  (+) Teeth Intact   Pulmonary neg pulmonary ROS, former smoker,    breath sounds clear to auscultation       Cardiovascular negative cardio ROS   Rhythm:regular Rate:Normal     Neuro/Psych negative neurological ROS  negative psych ROS   GI/Hepatic negative GI ROS, Neg liver ROS, GERD  ,  Endo/Other  negative endocrine ROS  Renal/GU negative Renal ROS  negative genitourinary   Musculoskeletal   Abdominal   Peds  Hematology negative hematology ROS (+) anemia ,   Anesthesia Other Findings Past Medical History: No date: Anemia No date: Arthritis No date: Diverticulitis No date: GERD (gastroesophageal reflux disease) No date: Vertigo  Past Surgical History: No date: ABDOMINAL HYSTERECTOMY 03/15/2016: COLONOSCOPY WITH PROPOFOL; N/A     Comment:  Procedure: COLONOSCOPY WITH PROPOFOL;  Surgeon: Lollie Sails, MD;  Location: [facility] ENDOSCOPY;  Service:               Endoscopy;  Laterality: N/A; No date: TONSILLECTOMY No date: WISDOM TOOTH EXTRACTION  BMI    Body Mass Index:  32.32 kg/m      Reproductive/Obstetrics negative OB ROS                            Anesthesia Physical Anesthesia Plan  ASA: II  Anesthesia Plan: General and Regional   Post-op Pain Management:    Induction:   PONV Risk Score and Plan: Ondansetron and Dexamethasone  Airway Management Planned:   Additional Equipment:   Intra-op Plan:   Post-operative Plan:   Informed Consent: I have reviewed the patients History and Physical, chart, labs and discussed the procedure including the risks, benefits and alternatives for the proposed anesthesia with the patient or  authorized representative who has indicated his/her understanding and acceptance.   Dental Advisory Given  Plan Discussed with: Anesthesiologist, CRNA and Surgeon  Anesthesia Plan Comments:         Anesthesia Quick Evaluation

## 2017-11-14 NOTE — OR Nursing (Signed)
Patients daughter had to bring her daughter to a doctors appointment in the hospital. Discharge instructions given but she had to leave, she will be back to pick up patient shortly.

## 2017-11-14 NOTE — Anesthesia Post-op Follow-up Note (Signed)
Anesthesia QCDR form completed.        

## 2017-11-14 NOTE — Anesthesia Procedure Notes (Signed)
Procedure Name: Intubation Date/Time: 11/14/2017 11:09 AM Performed by: Bernardo Heater, CRNA Pre-anesthesia Checklist: Patient identified, Emergency Drugs available, Suction available and Patient being monitored Patient Re-evaluated:Patient Re-evaluated prior to induction Oxygen Delivery Method: Circle system utilized Preoxygenation: Pre-oxygenation with 100% oxygen Induction Type: IV induction Laryngoscope Size: Mac and 3 Grade View: Grade I Tube size: 7.0 mm Number of attempts: 1 Placement Confirmation: ETT inserted through vocal cords under direct vision,  positive ETCO2 and breath sounds checked- equal and bilateral Secured at: 21 cm Tube secured with: Tape Dental Injury: Teeth and Oropharynx as per pre-operative assessment

## 2017-11-14 NOTE — Anesthesia Postprocedure Evaluation (Signed)
Anesthesia Post Note  Patient: Yolanda Barker  Procedure(s) Performed: SHOULDER ARTHROSCOPY WITH OPEN ROTATOR CUFF REPAIR (Right )  Patient location during evaluation: PACU Anesthesia Type: Regional and General Level of consciousness: awake and alert Pain management: pain level controlled Vital Signs Assessment: post-procedure vital signs reviewed and stable Respiratory status: spontaneous breathing, nonlabored ventilation, respiratory function stable and patient connected to nasal cannula oxygen Cardiovascular status: blood pressure returned to baseline and stable Anesthetic complications: yes Anesthetic complication details: PONV    Last Vitals:  Vitals:   11/14/17 1403 11/14/17 1434  BP: (!) 162/75 (!) 149/72  Pulse: 62 (!) 59  Resp: 16 16  Temp: (!) 36.2 C   SpO2: 100% 100%    Last Pain:  Vitals:   11/14/17 1434  TempSrc:   PainSc: 0-No pain                 Durenda Hurt

## 2017-11-14 NOTE — H&P (Signed)
Paper H&P to be scanned into permanent record. H&P reviewed and patient re-examined. No changes. 

## 2017-11-14 NOTE — Op Note (Signed)
11/14/2017  12:31 PM  Patient:   Yolanda Barker  Pre-Op Diagnosis:   Impingement/tendinopathy with partial-thickness rotator cuff tear, right shoulder.  Post-Op Diagnosis:   Impingement/tendinopathy with partial-thickness supraspinatus and subscapularis tendon tears and biceps tendinopathy, right shoulder.  Procedure:   Limited arthroscopic debridement, arthroscopic repair of subscapularis tendon tear, arthroscopic subacromial decompression, mini-open rotator cuff repair with application of a [employer] Regeneten patch, and mini-open biceps tenodesis, right shoulder.  Anesthesia:   General endotracheal with an Exparel interscalene block placed preoperatively by the anesthesiologist.  Surgeon:   Pascal Lux, MD  Assistant:   Cameron Proud, PA-C  Findings:   As above. There was a partial-thickness tear involving the superior insertional fibers of the subscapularis tendon, as well as an articular-sided partial-thickness tear involving the anterior insertional fibers of the supraspinatus tendon. The remaining portions of the rotator cuff are in satisfactory condition. There is mild fraying of the labrum superiorly and postero-superiorly without frank detachment from the glenoid. The biceps tendon demonstrated moderate tendinopathy with partial-thickness tearing.  Complications:   None  Fluids:   600 cc  Estimated blood loss:   5 cc  Tourniquet time:   None  Drains:   None  Closure:   Staples      Brief clinical note:   The patient is a 62 year old female with a history of gradually worsening right shoulder pain. The patient's symptoms have progressed despite medications, activity modification, etc. The patient's history and examination are consistent with impingement/tendinopathy with a rotator cuff tear. The preoperative MRI scan demonstrated the presence of a partial thickness rotator cuff tear. The patient presents at this time for definitive management of these shoulder  symptoms.  Procedure:   The patient underwent placement of an interscalene block by the anesthesiologist using Exparel in the preoperative holding area before being brought into the operating room and lain in the supine position. The patient then underwent general endotracheal intubation and anesthesia before being repositioned in the beach chair position using the beach chair positioner. The right shoulder and upper extremity were prepped with ChloraPrep solution before being draped sterilely. Preoperative antibiotics were administered. A timeout was performed to confirm the proper surgical site before the expected portal sites and incision site were injected with 0.5% Sensorcaine with epinephrine. A posterior portal was created and the glenohumeral joint thoroughly inspected with the findings as described above. An anterior portal was created using an outside-in technique. The labrum and rotator cuff were further probed, again confirming the above-noted findings. The areas of labral fraying were debrided back to stable margins using the full-radius resector, as were areas of synovitis anterosuperiorly. The ArthroCare wand was inserted and used to release the biceps tendon from its labral anchor, as well as to obtain hemostasis and to "anneal" the labrum superiorly and anteriorly. The instruments were removed from the joint after suctioning the excess fluid.  The camera was repositioned through the posterior portal into the subacromial space. A separate lateral portal was created using an outside-in technique. The 3.5 mm full-radius resector was introduced and used to perform a subtotal bursectomy. The ArthroCare wand was then inserted and used to remove the periosteal tissue off the undersurface of the anterior third of the acromion as well as to recess the coracoacromial ligament from its attachment along the anterior and lateral margins of the acromion. The 4.0 mm acromionizing bur was introduced and used to  complete the decompression by removing the undersurface of the anterior third of the acromion.  The full radius resector was reintroduced to remove any residual bony debris before the ArthroCare wand was reintroduced to obtain hemostasis. The instruments were then removed from the subacromial space after suctioning the excess fluid.  An approximately 4-5 cm incision was made over the anterolateral aspect of the shoulder beginning at the anterolateral corner of the acromion and extending distally in line with the bicipital groove. This incision was carried down through the subcutaneous tissues to expose the deltoid fascia. The raphae between the anterior and middle thirds was identified and this plane developed to provide access into the subacromial space. Additional bursal tissues were debrided sharply using Metzenbaum scissors. The rotator cuff tear was carefully palpated in the area of partial-thickness tearing identified.  This area was covered with a medium Pisek patch, securing the patch with the appropriate soft tissue and bony staples.  The bicipital groove was identified by palpation and opened for 1-1.5 cm. The biceps tendon stump was retrieved through this defect. The floor of the bicipital groove was roughened with a curet before a single Biomet 2.9 mm JuggerKnot anchor was inserted. Both sets of sutures were passed through the biceps tendon and tied securely to effect the tenodesis. The bicipital sheath was reapproximated using two #0 Ethibond interrupted sutures, incorporating the biceps tendon to further reinforce the tenodesis.  The wound was copiously irrigated with sterile saline solution before the deltoid raphae was reapproximated using 2-0 Vicryl interrupted sutures. The subcutaneous tissues were closed in two layers using 2-0 Vicryl interrupted sutures before the skin was closed using staples. The portal sites also were closed using staples. A sterile bulky dressing was  applied to the shoulder before the arm was placed into a shoulder immobilizer. The patient was then awakened, extubated, and returned to the recovery room in satisfactory condition after tolerating the procedure well.

## 2017-11-14 NOTE — Transfer of Care (Signed)
Immediate Anesthesia Transfer of Care Note  Patient: Yolanda Barker  Procedure(s) Performed: SHOULDER ARTHROSCOPY WITH OPEN ROTATOR CUFF REPAIR (Right )  Patient Location: PACU  Anesthesia Type:General  Level of Consciousness: awake, alert , oriented and patient cooperative  Airway & Oxygen Therapy: Patient Spontanous Breathing and Patient connected to nasal cannula oxygen  Post-op Assessment: Report given to RN and Post -op Vital signs reviewed and stable  Post vital signs: Reviewed and stable  Last Vitals:  Vitals Value Taken Time  BP    Temp    Pulse 75 11/14/2017 12:45 PM  Resp 22 11/14/2017 12:45 PM  SpO2 100 % 11/14/2017 12:45 PM  Vitals shown include unvalidated device data.  Last Pain:  Vitals:   11/14/17 1053  TempSrc:   PainSc: 0-No pain         Complications: No apparent anesthesia complications

## 2017-11-14 NOTE — Anesthesia Procedure Notes (Signed)
Anesthesia Regional Block: Interscalene brachial plexus block   Pre-Anesthetic Checklist: ,, timeout performed, Correct Patient, Correct Site, Correct Laterality, Correct Procedure, Correct Position, site marked, Risks and benefits discussed, Surgical consent, Pre-op evaluation  Laterality: Right  Prep: chloraprep       Needles:  Injection technique: Single-shot      Additional Needles:   Procedures:,,,, ultrasound used (permanent image in chart),,,,  Narrative:  Start time: 11/14/2017 10:00 AM End time: 11/14/2017 10:10 AM  Performed by: Personally  Anesthesiologist: Durenda Hurt, MD  Additional Notes: Negative aspiration.  No paresthesia on injection.

## 2017-11-15 ENCOUNTER — Encounter: Payer: Self-pay | Admitting: Surgery

## 2018-02-16 ENCOUNTER — Other Ambulatory Visit: Payer: Self-pay | Admitting: Family Medicine

## 2018-02-16 DIAGNOSIS — Z1231 Encounter for screening mammogram for malignant neoplasm of breast: Secondary | ICD-10-CM

## 2018-03-05 ENCOUNTER — Encounter
Admission: RE | Admit: 2018-03-05 | Discharge: 2018-03-05 | Disposition: A | Discharge status: Home / Self Care | Payer: BLUE CROSS/BLUE SHIELD | Source: Ambulatory Visit | Attending: Surgery | Admitting: Surgery

## 2018-03-05 ENCOUNTER — Other Ambulatory Visit: Payer: Self-pay

## 2018-03-05 NOTE — Patient Instructions (Signed)
Your procedure is scheduled on: 03-13-18 TUESDAY Report to Same Day Surgery 2nd floor medical mall [facility] Entrance-take elevator on left to 2nd floor.  Check in with surgery information desk.) To find out your arrival time please call [phone] between 1PM - 3PM on 03-12-18 MONDAY  Remember: Instructions that are not followed completely may result in serious medical risk, up to and including death, or upon the discretion of your surgeon and anesthesiologist your surgery may need to be rescheduled.    _x___ 1. Do not eat food after midnight the night before your procedure. You may drink clear liquids up to 2 hours before you are scheduled to arrive at the hospital for your procedure.  Do not drink clear liquids within 2 hours of your scheduled arrival to the hospital.  Clear liquids include  --Water or Apple juice without pulp  --Clear carbohydrate beverage such as ClearFast or Gatorade  --Black Coffee or Clear Tea (No milk, no creamers, do not add anything to the coffee or Tea   ____Ensure clear carbohydrate drink on the way to the hospital for bariatric patients  ____Ensure clear carbohydrate drink 3 hours before surgery for Dr Dwyane Luo patients if physician instructed.   No gum chewing or hard candies.     __x__ 2. No Alcohol for 24 hours before or after surgery.   __x__3. No Smoking or e-cigarettes for 24 prior to surgery.  Do not use any chewable tobacco products for at least 6 hour prior to surgery   ____  4. Bring all medications with you on the day of surgery if instructed.    __x__ 5. Notify your doctor if there is any change in your medical condition     (cold, fever, infections).    x___6. On the morning of surgery brush your teeth with toothpaste and water.  You may rinse your mouth with mouth wash if you wish.  Do not swallow any toothpaste or mouthwash.   Do not wear jewelry, make-up, hairpins, clips or nail polish.  Do not wear lotions, powders, or  perfumes. You may wear deodorant.  Do not shave 48 hours prior to surgery. Men may shave face and neck.  Do not bring valuables to the hospital.    [facility] is not responsible for any belongings or valuables.               Contacts, dentures or bridgework may not be worn into surgery.  Leave your suitcase in the car. After surgery it may be brought to your room.  For patients admitted to the hospital, discharge time is determined by your treatment team.  _  Patients discharged the day of surgery will not be allowed to drive home.  You will need someone to drive you home and stay with you the night of your procedure.    Please read over the following fact sheets that you were given:   [facility] Preparing for Surgery   ____ Take anti-hypertensive listed below, cardiac, seizure, asthma, anti-reflux and psychiatric medicines. These include:  1. NONE  2.  3.  4.  5.  6.  ____Fleets enema or Magnesium Citrate as directed.   ____ Use CHG Soap or sage wipes as directed on instruction sheet   ____ Use inhalers on the day of surgery and bring to hospital day of surgery  ____ Stop Metformin and Janumet 2 days prior to surgery.    ____ Take 1/2 of usual insulin dose the night before surgery and  none on the morning surgery.   ____ Follow recommendations from Cardiologist, Pulmonologist or PCP regarding stopping Aspirin, Coumadin, Plavix ,Eliquis, Effient, or Pradaxa, and Pletal.  X____Stop Anti-inflammatories such as Advil, Aleve, Ibuprofen, Motrin, Naproxen, Naprosyn, Goodies powders or aspirin products NOW- OK to take Tylenol   ____ Stop supplements until after surgery.     ____ Bring C-Pap to the hospital.

## 2018-03-13 ENCOUNTER — Other Ambulatory Visit: Payer: Self-pay

## 2018-03-13 ENCOUNTER — Ambulatory Visit
Admission: RE | Admit: 2018-03-13 | Discharge: 2018-03-13 | Disposition: A | Discharge status: Home / Self Care | Payer: BLUE CROSS/BLUE SHIELD | Attending: Surgery | Admitting: Surgery

## 2018-03-13 ENCOUNTER — Ambulatory Visit: Payer: BLUE CROSS/BLUE SHIELD | Admitting: Anesthesiology

## 2018-03-13 ENCOUNTER — Encounter: Payer: Self-pay | Admitting: *Deleted

## 2018-03-13 ENCOUNTER — Encounter
Admission: RE | Disposition: A | Discharge status: Home / Self Care | Payer: Self-pay | Source: Home / Self Care | Attending: Surgery

## 2018-03-13 DIAGNOSIS — Z90710 Acquired absence of both cervix and uterus: Secondary | ICD-10-CM | POA: Insufficient documentation

## 2018-03-13 DIAGNOSIS — K219 Gastro-esophageal reflux disease without esophagitis: Secondary | ICD-10-CM | POA: Insufficient documentation

## 2018-03-13 DIAGNOSIS — R42 Dizziness and giddiness: Secondary | ICD-10-CM | POA: Diagnosis not present

## 2018-03-13 DIAGNOSIS — K5790 Diverticulosis of intestine, part unspecified, without perforation or abscess without bleeding: Secondary | ICD-10-CM | POA: Insufficient documentation

## 2018-03-13 DIAGNOSIS — M7501 Adhesive capsulitis of right shoulder: Secondary | ICD-10-CM | POA: Insufficient documentation

## 2018-03-13 DIAGNOSIS — Z823 Family history of stroke: Secondary | ICD-10-CM | POA: Diagnosis not present

## 2018-03-13 DIAGNOSIS — Z87891 Personal history of nicotine dependence: Secondary | ICD-10-CM | POA: Insufficient documentation

## 2018-03-13 DIAGNOSIS — Z79899 Other long term (current) drug therapy: Secondary | ICD-10-CM | POA: Diagnosis not present

## 2018-03-13 DIAGNOSIS — Z880 Allergy status to penicillin: Secondary | ICD-10-CM | POA: Diagnosis not present

## 2018-03-13 DIAGNOSIS — Z808 Family history of malignant neoplasm of other organs or systems: Secondary | ICD-10-CM | POA: Insufficient documentation

## 2018-03-13 HISTORY — PX: STERIOD INJECTION: SHX5046

## 2018-03-13 HISTORY — PX: SHOULDER CLOSED REDUCTION: SHX1051

## 2018-03-13 SURGERY — MANIPULATION, JOINT, SHOULDER, WITH ANESTHESIA
Anesthesia: General | Laterality: Right

## 2018-03-13 MED ORDER — PROPOFOL 10 MG/ML IV BOLUS
INTRAVENOUS | Status: DC | PRN
  Administered 2018-03-13: 100 mg via INTRAVENOUS
  Administered 2018-03-13: 40 mg via INTRAVENOUS

## 2018-03-13 MED ORDER — HYDROMORPHONE HCL 1 MG/ML IJ SOLN
0.2500 mg | INTRAMUSCULAR | Status: DC | PRN
  Administered 2018-03-13 (×4): 0.25 mg via INTRAVENOUS

## 2018-03-13 MED ORDER — FAMOTIDINE 20 MG PO TABS
20.0000 mg | ORAL_TABLET | Freq: Once | ORAL | Status: AC
  Administered 2018-03-13: 20 mg via ORAL

## 2018-03-13 MED ORDER — PROPOFOL 10 MG/ML IV BOLUS
INTRAVENOUS | Status: AC
  Filled 2018-03-13: qty 20

## 2018-03-13 MED ORDER — FENTANYL CITRATE (PF) 100 MCG/2ML IJ SOLN
INTRAMUSCULAR | Status: DC | PRN
  Administered 2018-03-13: 50 ug via INTRAVENOUS

## 2018-03-13 MED ORDER — TRIAMCINOLONE ACETONIDE 40 MG/ML IJ SUSP
INTRAMUSCULAR | Status: AC
  Filled 2018-03-13: qty 1

## 2018-03-13 MED ORDER — FENTANYL CITRATE (PF) 100 MCG/2ML IJ SOLN
INTRAMUSCULAR | Status: AC
  Filled 2018-03-13: qty 2

## 2018-03-13 MED ORDER — HYDROMORPHONE HCL 1 MG/ML IJ SOLN
INTRAMUSCULAR | Status: AC
  Administered 2018-03-13: 0.25 mg via INTRAVENOUS
  Filled 2018-03-13: qty 1

## 2018-03-13 MED ORDER — LIDOCAINE HCL (CARDIAC) PF 100 MG/5ML IV SOSY
PREFILLED_SYRINGE | INTRAVENOUS | Status: DC | PRN
  Administered 2018-03-13: 70 mg via INTRAVENOUS

## 2018-03-13 MED ORDER — MIDAZOLAM HCL 2 MG/2ML IJ SOLN
INTRAMUSCULAR | Status: AC
  Filled 2018-03-13: qty 2

## 2018-03-13 MED ORDER — FAMOTIDINE 20 MG PO TABS
ORAL_TABLET | ORAL | Status: AC
  Filled 2018-03-13: qty 1

## 2018-03-13 MED ORDER — BUPIVACAINE-EPINEPHRINE (PF) 0.25% -1:200000 IJ SOLN
INTRAMUSCULAR | Status: DC | PRN
  Administered 2018-03-13: 10 mL

## 2018-03-13 MED ORDER — BUPIVACAINE HCL (PF) 0.25 % IJ SOLN
INTRAMUSCULAR | Status: AC
  Filled 2018-03-13: qty 30

## 2018-03-13 MED ORDER — HYDROMORPHONE HCL 1 MG/ML IJ SOLN
INTRAMUSCULAR | Status: AC
  Filled 2018-03-13: qty 1

## 2018-03-13 MED ORDER — BUPIVACAINE-EPINEPHRINE (PF) 0.25% -1:200000 IJ SOLN
INTRAMUSCULAR | Status: AC
  Filled 2018-03-13: qty 30

## 2018-03-13 MED ORDER — TRIAMCINOLONE ACETONIDE 40 MG/ML IJ SUSP
INTRAMUSCULAR | Status: DC | PRN
  Administered 2018-03-13: 40 mg via INTRA_ARTICULAR

## 2018-03-13 MED ORDER — LACTATED RINGERS IV SOLN
INTRAVENOUS | Status: DC
  Administered 2018-03-13: 10:00:00 via INTRAVENOUS

## 2018-03-13 MED ORDER — MIDAZOLAM HCL 2 MG/2ML IJ SOLN
INTRAMUSCULAR | Status: DC | PRN
  Administered 2018-03-13: 1 mg via INTRAVENOUS

## 2018-03-13 SURGICAL SUPPLY — 11 items
BNDG ADH 2 X3.75 FABRIC TAN LF (GAUZE/BANDAGES/DRESSINGS) ×2 IMPLANT
COVER WAND RF STERILE (DRAPES) ×2 IMPLANT
GAUZE SPONGE 4X4 12PLY STRL (GAUZE/BANDAGES/DRESSINGS) ×2 IMPLANT
KIT TURNOVER KIT A (KITS) ×2 IMPLANT
NEEDLE HYPO 21X1.5 SAFETY (NEEDLE) ×2 IMPLANT
PAD ALCOHOL SWAB (MISCELLANEOUS) ×4 IMPLANT
SLING ARM LRG DEEP (SOFTGOODS) IMPLANT
SLING ARM M TX990204 (SOFTGOODS) ×2 IMPLANT
SOL PREP PVP 2OZ (MISCELLANEOUS)
SOLUTION PREP PVP 2OZ (MISCELLANEOUS) IMPLANT
SYR 10ML LL (SYRINGE) ×2 IMPLANT

## 2018-03-13 NOTE — Anesthesia Post-op Follow-up Note (Signed)
Anesthesia QCDR form completed.        

## 2018-03-13 NOTE — Discharge Instructions (Addendum)
AMBULATORY SURGERY  DISCHARGE INSTRUCTIONS   1) The drugs that you were given will stay in your system until tomorrow so for the next 24 hours you should not:  A) Drive an automobile B) Make any legal decisions C) Drink any alcoholic beverage   2) You may resume regular meals tomorrow.  Today it is better to start with liquids and gradually work up to solid foods.  You may eat anything you prefer, but it is better to start with liquids, then soup and crackers, and gradually work up to solid foods.   3) Please notify your doctor immediately if you have any unusual bleeding, trouble breathing, redness and pain at the surgery site, drainage, fever, or pain not relieved by medication.    4) Additional Instructions:        Please contact your physician with any problems or Same Day Surgery at [phone], Monday through Friday 6 am to 4 pm, or [place] at [facility] number at [phone].AMBULATORY SURGERY  DISCHARGE INSTRUCTIONS   5) The drugs that you were given will stay in your system until tomorrow so for the next 24 hours you should not:  D) Drive an automobile E) Make any legal decisions F) Drink any alcoholic beverage   6) You may resume regular meals tomorrow.  Today it is better to start with liquids and gradually work up to solid foods.  You may eat anything you prefer, but it is better to start with liquids, then soup and crackers, and gradually work up to solid foods.   7) Please notify your doctor immediately if you have any unusual bleeding, trouble breathing, redness and pain at the surgery site, drainage, fever, or pain not relieved by medication.    8) Additional Instructions:        Please contact your physician with any problems or Same Day Surgery at [phone], Monday through Friday 6 am to 4 pm, or [place] at [facility] number at [phone].AMBULATORY SURGERY  DISCHARGE INSTRUCTIONS   9) The drugs that you were given  will stay in your system until tomorrow so for the next 24 hours you should not:  G) Drive an automobile H) Make any legal decisions I) Drink any alcoholic beverage   10) You may resume regular meals tomorrow.  Today it is better to start with liquids and gradually work up to solid foods.  You may eat anything you prefer, but it is better to start with liquids, then soup and crackers, and gradually work up to solid foods.   11) Please notify your doctor immediately if you have any unusual bleeding, trouble breathing, redness and pain at the surgery site, drainage, fever, or pain not relieved by medication.    12) Additional Instructions:        Please contact your physician with any problems or Same Day Surgery at [phone], Monday through Friday 6 am to 4 pm, or [place] at [facility] number at [phone].AMBULATORY SURGERY  DISCHARGE INSTRUCTIONS   13) The drugs that you were given will stay in your system until tomorrow so for the next 24 hours you should not:  J) Drive an automobile K) Make any legal decisions L) Drink any alcoholic beverage   14) You may resume regular meals tomorrow.  Today it is better to start with liquids and gradually work up to solid foods.  You may eat anything you prefer, but it is better to start with liquids, then soup and crackers, and gradually work  up to solid foods.   15) Please notify your doctor immediately if you have any unusual bleeding, trouble breathing, redness and pain at the surgery site, drainage, fever, or pain not relieved by medication.    16) Additional Instructions:        Please contact your physician with any problems or Same Day Surgery at [phone], Monday through Friday 6 am to 4 pm, or [place] at [facility] number at [phone].Orthopedic discharge instructions: May shower in AM. Apply ice to affected area frequently. Take ibuprofen 600-800 mg TID with meals for 7-10 days, then as  necessary. Take ES Tylenol when needed.  Start PT on Thursday as scheduled. Return for follow-up in 10-14 days or as scheduled.

## 2018-03-13 NOTE — Anesthesia Postprocedure Evaluation (Signed)
Anesthesia Post Note  Patient: Yolanda Barker  Procedure(s) Performed: CLOSED MANIPULATION SHOULDER (Right ) STEROID INJECTION RIGHT SHOULDER (Right )  Patient location during evaluation: PACU Anesthesia Type: General Level of consciousness: awake and alert Pain management: pain level controlled Vital Signs Assessment: post-procedure vital signs reviewed and stable Respiratory status: spontaneous breathing, nonlabored ventilation, respiratory function stable and patient connected to nasal cannula oxygen Cardiovascular status: blood pressure returned to baseline and stable Postop Assessment: no apparent nausea or vomiting Anesthetic complications: no     Last Vitals:  Vitals:   03/13/18 1314 03/13/18 1320  BP:  (!) 173/76  Pulse: 84 71  Resp: (!) 22 18  Temp:  37.1 C  SpO2: 99%     Last Pain:  Vitals:   03/13/18 1320  TempSrc: Temporal  PainSc: [place]

## 2018-03-13 NOTE — Anesthesia Procedure Notes (Signed)
Date/Time: 03/13/2018 11:55 AM Performed by: Johnna Acosta, CRNA Pre-anesthesia Checklist: Patient identified, Emergency Drugs available, Suction available, Patient being monitored and Timeout performed Patient Re-evaluated:Patient Re-evaluated prior to induction Oxygen Delivery Method: Circle system utilized Preoxygenation: Pre-oxygenation with 100% oxygen Induction Type: IV induction

## 2018-03-13 NOTE — H&P (Signed)
Paper H&P to be scanned into permanent record. H&P reviewed and patient re-examined. No changes. 

## 2018-03-13 NOTE — Anesthesia Preprocedure Evaluation (Addendum)
Anesthesia Evaluation  Patient identified by MRN, date of birth, ID band Patient awake    Reviewed: Allergy & Precautions, H&P , NPO status , Patient's Chart, lab work & pertinent test results  History of Anesthesia Complications (+) Emergence Delirium  Airway Mallampati: III  TM Distance: >3 FB     Dental  (+) Partial Upper   Pulmonary neg pulmonary ROS, neg COPD, former smoker,           Cardiovascular + Past MI  (-) Cardiac Stents (-) dysrhythmias      Neuro/Psych negative neurological ROS  negative psych ROS   GI/Hepatic Neg liver ROS, GERD  Controlled,  Endo/Other  negative endocrine ROS  Renal/GU negative Renal ROS  negative genitourinary   Musculoskeletal   Abdominal   Peds  Hematology  (+) Blood dyscrasia, anemia ,   Anesthesia Other Findings Past Medical History: No date: Anemia No date: Arthritis No date: Diverticulitis No date: GERD (gastroesophageal reflux disease) No date: Vertigo  Past Surgical History: No date: ABDOMINAL HYSTERECTOMY 03/15/2016: COLONOSCOPY WITH PROPOFOL; N/A     Comment:  Procedure: COLONOSCOPY WITH PROPOFOL;  Surgeon: Lollie Sails, MD;  Location: [facility] ENDOSCOPY;  Service:               Endoscopy;  Laterality: N/A; 11/14/2017: SHOULDER ARTHROSCOPY WITH OPEN ROTATOR CUFF REPAIR; Right     Comment:  Procedure: SHOULDER ARTHROSCOPY WITH OPEN ROTATOR CUFF               REPAIR;  Surgeon: Corky Mull, MD;  Location: [facility] ORS;              Service: Orthopedics;  Laterality: Right; No date: TONSILLECTOMY No date: WISDOM TOOTH EXTRACTION  BMI    Body Mass Index:  33.08 kg/m      Reproductive/Obstetrics negative OB ROS                            Anesthesia Physical Anesthesia Plan  ASA: II  Anesthesia Plan: General   Post-op Pain Management:    Induction:   PONV Risk Score and Plan:   Airway Management Planned: Mask and  Natural Airway  Additional Equipment:   Intra-op Plan:   Post-operative Plan:   Informed Consent: I have reviewed the patients History and Physical, chart, labs and discussed the procedure including the risks, benefits and alternatives for the proposed anesthesia with the patient or authorized representative who has indicated his/her understanding and acceptance.   Dental Advisory Given  Plan Discussed with: Anesthesiologist  Anesthesia Plan Comments:         Anesthesia Quick Evaluation

## 2018-03-13 NOTE — Transfer of Care (Signed)
Immediate Anesthesia Transfer of Care Note  Patient: Yolanda Barker  Procedure(s) Performed: CLOSED MANIPULATION SHOULDER (Right ) STEROID INJECTION RIGHT SHOULDER (Right )  Patient Location: PACU  Anesthesia Type:General  Level of Consciousness: drowsy  Airway & Oxygen Therapy: Patient Spontanous Breathing and Patient connected to face mask oxygen  Post-op Assessment: Report given to RN and Post -op Vital signs reviewed and stable  Post vital signs: Reviewed and stable  Last Vitals:  Vitals Value Taken Time  BP 138/71 03/13/2018 12:14 PM  Temp 36.9 C 03/13/2018 12:14 PM  Pulse 80 03/13/2018 12:20 PM  Resp 24 03/13/2018 12:20 PM  SpO2 100 % 03/13/2018 12:20 PM  Vitals shown include unvalidated device data.  Last Pain:  Vitals:   03/13/18 1214  PainSc: Asleep         Complications: No apparent anesthesia complications

## 2018-03-13 NOTE — Op Note (Signed)
03/13/2018  12:33 PM  Patient:   Yolanda Barker  Pre-Op Diagnosis:   Secondary adhesive capsulitis, right shoulder.  Post-Op Diagnosis:   Same  Procedure:   Manipulation under anesthesia with steroid injection, right shoulder.  Surgeon:   Pascal Lux, MD  Assistant:   None  Anesthesia:   IV sedation  Findings:   As above. Prior to manipulation, the right shoulder could be forward flexed to 130 and abducted to 120. At 90 of abduction, the shoulder could be externally rotated to 70 and internally rotated to 50. Following manipulation, the shoulder could be forward flexed to 165, abducted to 160 and, at 90 of abduction, externally rotated to 90 and internally rotated to 70.  Complications:   None  EBL:   0 cc  Fluids:   200 cc crystalloid  TT:   None  Drains:   None  Closure:   None  Brief Clinical Note:   The patient is a 62 year old female who is now 4 months status post a right shoulder arthroscopy with debridement, decompression, mini-open rotator cuff repair, and mini-open biceps tenodesis. Despite extensive physical therapy, the patient continues to have difficulty regaining shoulder range of motion. The patient's history and examination are consistent with adhesive capsulitis. The patient presents at this time for a manipulation under anesthesia with steroid injection of the right shoulder.  Procedure:   The patient was brought into the operating room and lain in the supine position. After adequate IV sedation was achieved, a timeout was performed to verify the correct surgical site. The right shoulder was gently manipulated in both abduction and external rotation, as well as adduction and internal rotation. Several palpable and audible pops were heard as the scar tissue released, permitting full range of motion of the shoulder. The glenohumeral joint was injected sterilely using 1 cc of Kenalog-40 and 9 cc of 0.25% Sensorcaine with epinephrine before the patient  was placed into a sling. The patient was then awakened and returned to the recovery room in satisfactory condition after tolerating the procedure well.

## 2018-03-14 ENCOUNTER — Encounter: Payer: Self-pay | Admitting: Surgery

## 2018-03-18 ENCOUNTER — Other Ambulatory Visit: Payer: Self-pay

## 2018-03-18 ENCOUNTER — Emergency Department: Payer: BLUE CROSS/BLUE SHIELD

## 2018-03-18 ENCOUNTER — Emergency Department
Admission: EM | Admit: 2018-03-18 | Discharge: 2018-03-18 | Disposition: A | Discharge status: Home / Self Care | Payer: BLUE CROSS/BLUE SHIELD | Attending: Emergency Medicine | Admitting: Emergency Medicine

## 2018-03-18 ENCOUNTER — Encounter: Payer: Self-pay | Admitting: Emergency Medicine

## 2018-03-18 DIAGNOSIS — Z87891 Personal history of nicotine dependence: Secondary | ICD-10-CM | POA: Insufficient documentation

## 2018-03-18 DIAGNOSIS — J069 Acute upper respiratory infection, unspecified: Secondary | ICD-10-CM | POA: Insufficient documentation

## 2018-03-18 DIAGNOSIS — M5489 Other dorsalgia: Secondary | ICD-10-CM | POA: Diagnosis not present

## 2018-03-18 DIAGNOSIS — Z79899 Other long term (current) drug therapy: Secondary | ICD-10-CM | POA: Diagnosis not present

## 2018-03-18 DIAGNOSIS — R05 Cough: Secondary | ICD-10-CM | POA: Diagnosis present

## 2018-03-18 DIAGNOSIS — B9789 Other viral agents as the cause of diseases classified elsewhere: Secondary | ICD-10-CM

## 2018-03-18 MED ORDER — PSEUDOEPH-BROMPHEN-DM 30-2-10 MG/5ML PO SYRP: 5.0000 mL | ORAL_SOLUTION | Freq: Four times a day (QID) | ORAL | 0 refills | Status: DC | PRN

## 2018-03-18 NOTE — ED Provider Notes (Signed)
[facility] Emergency Department Provider Note   ____________________________________________   First MD Initiated Contact with Patient 03/18/18 [phone]     (approximate)  I have reviewed the triage vital signs and the nursing notes.   HISTORY  Chief Complaint Cough and Back Pain   HPI Yolanda Barker is a 63 y.o. female presents to the ED with complaint of sinus drainage and cough for last 2 days.  Patient is unaware of any fever.  She states that since she began coughing she is also had some upper back pain.  She normally takes prescription allergy medicine for her sinuses.  She is a non-smoker.  She denies any flu symptoms.  She rates her pain as a 5/10.   Past Medical History:  Diagnosis Date  . Anemia   . Arthritis   . Diverticulitis   . GERD (gastroesophageal reflux disease)   . Vertigo     There are no active problems to display for this patient.   Past Surgical History:  Procedure Laterality Date  . ABDOMINAL HYSTERECTOMY    . COLONOSCOPY WITH PROPOFOL N/A 03/15/2016   Procedure: COLONOSCOPY WITH PROPOFOL;  Surgeon: Lollie Sails, MD;  Location[facility] ENDOSCOPY;  Service: Endoscopy;  Laterality: N/A;  . SHOULDER ARTHROSCOPY WITH OPEN ROTATOR CUFF REPAIR Right 11/14/2017   Procedure: SHOULDER ARTHROSCOPY WITH OPEN ROTATOR CUFF REPAIR;  Surgeon: Corky Mull, MD;  Location: [facility] ORS;  Service: Orthopedics;  Laterality: Right;  . SHOULDER CLOSED REDUCTION Right 03/13/2018   Procedure: CLOSED MANIPULATION SHOULDER;  Surgeon: Corky Mull, MD;  Location: [facility] ORS;  Service: Orthopedics;  Laterality: Right;  . STERIOD INJECTION Right 03/13/2018   Procedure: STEROID INJECTION RIGHT SHOULDER;  Surgeon: Corky Mull, MD;  Location: [facility] ORS;  Service: Orthopedics;  Laterality: Right;  . TONSILLECTOMY    . WISDOM TOOTH EXTRACTION      Prior to Admission medications   Medication Sig Start Date End Date Taking? Authorizing Provider  acetaminophen  (TYLENOL) 500 MG tablet Take 500 mg by mouth every 6 (six) hours as needed for moderate pain or headache.     [provider]  brompheniramine-pseudoephedrine-DM 30-2-10 MG/5ML syrup Take 5 mLs by mouth 4 (four) times daily as needed. 03/18/18   Johnn Hai, PA-C  Cholecalciferol (VITAMIN D-1000 MAX ST) 1000 units tablet Take 1,000 Units by mouth daily.     [provider]  ferrous sulfate 325 (65 FE) MG tablet Take 325 mg by mouth daily.     [provider]  fexofenadine (ALLEGRA) 180 MG tablet Take 180 mg by mouth as needed.     [provider]  fluticasone (FLONASE ALLERGY RELIEF) 50 MCG/ACT nasal spray Place 2 sprays into both nostrils daily as needed for allergies.     [provider]  ibuprofen (ADVIL,MOTRIN) 200 MG tablet Take 400 mg by mouth every 6 (six) hours as needed for headache or moderate pain.    [provider]  meclizine (ANTIVERT) 25 MG tablet Take 25 mg by mouth 3 (three) times daily as needed for dizziness.     [provider]  omeprazole (PRILOSEC) 20 MG capsule Take 20 mg by mouth 2 (two) times daily as needed (for acid reflux).     [provider]  Propylene Glycol (SYSTANE BALANCE) 0.6 % SOLN Place 1 drop into both eyes daily as needed (for dry eyes).    [provider]    Allergies Penicillin g  Family History  Problem Relation Age of Onset  . Bone cancer Mother   . Stroke Father   . Breast cancer Neg Hx     Social History Social History   Tobacco Use  . Smoking status: Former Smoker    Packs/day: 1.00    Years: 15.00    Pack years: 15.00    Types: Cigarettes    Last attempt to quit: 11/07/1997    Years since quitting: 20.3  . Smokeless tobacco: Never Used  Substance Use Topics  . Alcohol use: No  . Drug use: No    Review of Systems Constitutional: No fever/chills Eyes: No visual changes. ENT: Positive nasal congestion.  Negative for sore throat. Cardiovascular:  Denies chest pain. Respiratory: Denies shortness of breath.  Positive productive cough. Gastrointestinal: No abdominal pain.  No nausea, no vomiting.  No diarrhea.  Musculoskeletal: Positive for upper back pain. Skin: Negative for rash. Neurological: Negative for headaches ____________________________________________   PHYSICAL EXAM:  VITAL SIGNS: ED Triage Vitals  Enc Vitals Group     BP 03/18/18 0425 (!) 154/75     Pulse Rate 03/18/18 0425 61     Resp 03/18/18 0425 18     Temp 03/18/18 0425 98.2 F (36.8 C)     Temp Source 03/18/18 0425 Oral     SpO2 03/18/18 0425 98 %     Weight --      Height --      Head Circumference --      Peak Flow --      Pain Score 03/18/18 0426 5     Pain Loc --      Pain Edu? --      Excl. in [place]? --    Constitutional: Alert and oriented. Well appearing and in no acute distress. Eyes: Conjunctivae are normal.  Head: Atraumatic. Nose: mild congestion/no rhinnorhea. Mouth/Throat: Mucous membranes are moist.  Oropharynx non-erythematous.  Positive posterior drainage.  Uvula is midline.  No exudates seen. Neck: No stridor.   Hematological/Lymphatic/Immunilogical: No cervical lymphadenopathy. Cardiovascular: Normal rate, regular rhythm. Grossly normal heart sounds.  Good peripheral circulation. Respiratory: Normal respiratory effort.  No retractions. Lungs CTAB. Musculoskeletal: No lower extremity tenderness nor edema.  No joint effusions.  No gross deformity noted of the back and soft tissue is tender.  No bony tenderness is noted. Neurologic:  Normal speech and language. No gross focal neurologic deficits are appreciated. No gait instability. Skin:  Skin is warm, dry and intact. No rash noted. Psychiatric: Mood and affect are normal. Speech and behavior are normal.  ____________________________________________   LABS (all labs ordered are listed, but only abnormal results are displayed)  Labs Reviewed - No data to  display  RADIOLOGY   Official radiology report(s): Dg Chest 2 View  Result Date: 03/18/2018 CLINICAL DATA:  Acute onset of cough, sinus drainage and upper back pain. EXAM: CHEST - 2 VIEW COMPARISON:  None. FINDINGS: The lungs are well-aerated and clear. There is no evidence of focal opacification, pleural effusion or pneumothorax. The heart is normal in size; the mediastinal contour is within normal limits. No acute osseous abnormalities are seen. IMPRESSION: No acute cardiopulmonary process seen. Electronically Signed   By: Garald Balding M.D.   On: 03/18/2018 04:58    ____________________________________________   PROCEDURES  Procedure(s) performed: None  Procedures  Critical Care performed: No  ____________________________________________   INITIAL IMPRESSION / ASSESSMENT AND PLAN / ED COURSE  As part of my medical decision making, I reviewed the following data within the  electronic MEDICAL RECORD NUMBER Notes from prior ED visits and [place] Controlled Substance Database  Patient presents to the ED with complaint of cough and congestion for 2 days.  Patient has not taken any over-the-counter medication but has been using throat lozenges instead.  She is unaware of any fever and denies chills.  She also complains of upper back pain with her coughing.  Chest x-ray was reassuring that she does not have pneumonia and physical exam was consistent with a viral URI.  Patient was made aware and was discharged with prescription for Bromfed-DM as needed for cough and congestion.  She is to increase fluids and also take Tylenol or ibuprofen as needed for body aches.  She is to follow-up with her PCP at [facility] if any continued problems.  ____________________________________________   FINAL CLINICAL IMPRESSION(S) / ED DIAGNOSES  Final diagnoses:  Viral URI with cough     ED Discharge Orders         Ordered    brompheniramine-pseudoephedrine-DM 30-2-10 MG/5ML syrup  4 times daily  PRN     03/18/18 0746           Note:  This document was prepared using Dragon voice recognition software and may include unintentional dictation errors.    Johnn Hai, PA-C 03/18/18 1016    Harvest Dark, MD 03/18/18 1453

## 2018-03-18 NOTE — ED Notes (Signed)
Pt reports productive cough for several days, pt states she takes Allegra daily, but states cough has caused back to start hurting. Denies any fevers.

## 2018-03-18 NOTE — Discharge Instructions (Signed)
Follow-up with your primary care provider if any continued problems.  Continue taking your Allegra for allergies.  Increase fluids.  You may take Tylenol or ibuprofen as needed for muscle aches or back pain.  Begin taking Bromfed-DM as needed for cough and congestion.

## 2018-03-18 NOTE — ED Triage Notes (Signed)
Patient reports sinus drainage and now cough and upper back pain due to cough.

## 2018-04-03 ENCOUNTER — Ambulatory Visit
Admission: RE | Admit: 2018-04-03 | Discharge: 2018-04-03 | Disposition: A | Discharge status: Home / Self Care | Payer: BLUE CROSS/BLUE SHIELD | Source: Ambulatory Visit | Attending: Family Medicine | Admitting: Family Medicine

## 2018-04-03 DIAGNOSIS — Z1231 Encounter for screening mammogram for malignant neoplasm of breast: Secondary | ICD-10-CM | POA: Diagnosis present

## 2018-04-13 ENCOUNTER — Other Ambulatory Visit: Payer: Self-pay | Admitting: Family Medicine

## 2018-04-13 DIAGNOSIS — Z13820 Encounter for screening for osteoporosis: Secondary | ICD-10-CM

## 2018-05-15 ENCOUNTER — Other Ambulatory Visit: Payer: BLUE CROSS/BLUE SHIELD

## 2018-06-06 ENCOUNTER — Other Ambulatory Visit: Payer: BLUE CROSS/BLUE SHIELD

## 2018-07-11 ENCOUNTER — Other Ambulatory Visit: Payer: BLUE CROSS/BLUE SHIELD

## 2018-09-27 ENCOUNTER — Other Ambulatory Visit: Payer: Self-pay

## 2018-09-27 ENCOUNTER — Ambulatory Visit
Admission: RE | Admit: 2018-09-27 | Discharge: 2018-09-27 | Disposition: A | Discharge status: Home / Self Care | Payer: Medicare Other | Source: Ambulatory Visit | Attending: Family Medicine | Admitting: Family Medicine

## 2018-09-27 DIAGNOSIS — Z13820 Encounter for screening for osteoporosis: Secondary | ICD-10-CM | POA: Diagnosis not present

## 2018-09-27 DIAGNOSIS — M8589 Other specified disorders of bone density and structure, multiple sites: Secondary | ICD-10-CM | POA: Insufficient documentation

## 2018-10-22 ENCOUNTER — Emergency Department: Payer: Medicare Other

## 2018-10-22 ENCOUNTER — Emergency Department
Admission: EM | Admit: 2018-10-22 | Discharge: 2018-10-22 | Disposition: A | Discharge status: Home / Self Care | Payer: Medicare Other | Attending: Emergency Medicine | Admitting: Emergency Medicine

## 2018-10-22 ENCOUNTER — Other Ambulatory Visit: Payer: Self-pay

## 2018-10-22 DIAGNOSIS — K5792 Diverticulitis of intestine, part unspecified, without perforation or abscess without bleeding: Secondary | ICD-10-CM

## 2018-10-22 DIAGNOSIS — R109 Unspecified abdominal pain: Secondary | ICD-10-CM | POA: Diagnosis present

## 2018-10-22 DIAGNOSIS — Z79899 Other long term (current) drug therapy: Secondary | ICD-10-CM | POA: Insufficient documentation

## 2018-10-22 DIAGNOSIS — Z87891 Personal history of nicotine dependence: Secondary | ICD-10-CM | POA: Insufficient documentation

## 2018-10-22 LAB — CBC
HCT: 37.4 % (ref 36.0–46.0)
Hemoglobin: 12 g/dL (ref 12.0–15.0)
MCH: 27.1 pg (ref 26.0–34.0)
MCHC: 32.1 g/dL (ref 30.0–36.0)
MCV: 84.6 fL (ref 80.0–100.0)
Platelets: 258 10*3/uL (ref 150–400)
RBC: 4.42 MIL/uL (ref 3.87–5.11)
RDW: 13.5 % (ref 11.5–15.5)
WBC: 6.2 10*3/uL (ref 4.0–10.5)
nRBC: 0 % (ref 0.0–0.2)

## 2018-10-22 LAB — URINALYSIS, COMPLETE (UACMP) WITH MICROSCOPIC
Bacteria, UA: NONE SEEN
Bilirubin Urine: NEGATIVE
Glucose, UA: NEGATIVE mg/dL
Ketones, ur: NEGATIVE mg/dL
Leukocytes,Ua: NEGATIVE
Nitrite: NEGATIVE
Protein, ur: NEGATIVE mg/dL
Specific Gravity, Urine: 1.013 (ref 1.005–1.030)
pH: 6 (ref 5.0–8.0)

## 2018-10-22 LAB — COMPREHENSIVE METABOLIC PANEL
ALT: 22 U/L (ref 0–44)
AST: 21 U/L (ref 15–41)
Albumin: 4.2 g/dL (ref 3.5–5.0)
Alkaline Phosphatase: 66 U/L (ref 38–126)
Anion gap: 5 (ref 5–15)
BUN: 20 mg/dL (ref 8–23)
CO2: 27 mmol/L (ref 22–32)
Calcium: 9.3 mg/dL (ref 8.9–10.3)
Chloride: 108 mmol/L (ref 98–111)
Creatinine, Ser: 0.87 mg/dL (ref 0.44–1.00)
GFR calc Af Amer: >60 mL/min (ref >60)
GFR calc non Af Amer: >60 mL/min (ref >60)
Glucose, Bld: 103 mg/dL — ABNORMAL HIGH (ref 70–99)
Potassium: 3.9 mmol/L (ref 3.5–5.1)
Sodium: 140 mmol/L (ref 135–145)
Total Bilirubin: 0.5 mg/dL (ref 0.3–1.2)
Total Protein: 7.3 g/dL (ref 6.5–8.1)

## 2018-10-22 LAB — LIPASE, BLOOD: Lipase: 39 U/L (ref 11–51)

## 2018-10-22 MED ORDER — IOHEXOL 300 MG/ML  SOLN
75.0000 mL | Freq: Once | INTRAMUSCULAR | Status: AC | PRN
  Administered 2018-10-22: 75 mL via INTRAVENOUS

## 2018-10-22 MED ORDER — METRONIDAZOLE 500 MG PO TABS: 500.0000 mg | ORAL_TABLET | Freq: Three times a day (TID) | ORAL | 0 refills | Status: AC

## 2018-10-22 MED ORDER — KETOROLAC TROMETHAMINE 30 MG/ML IJ SOLN
15.0000 mg | Freq: Once | INTRAMUSCULAR | Status: AC
  Administered 2018-10-22: 15 mg via INTRAVENOUS
  Filled 2018-10-22: qty 1

## 2018-10-22 MED ORDER — ONDANSETRON 4 MG PO TBDP: ORAL_TABLET | ORAL | 0 refills | Status: DC

## 2018-10-22 MED ORDER — CIPROFLOXACIN HCL 500 MG PO TABS: 500.0000 mg | ORAL_TABLET | Freq: Two times a day (BID) | ORAL | 0 refills | Status: AC

## 2018-10-22 MED ORDER — METRONIDAZOLE 500 MG PO TABS
500.0000 mg | ORAL_TABLET | Freq: Once | ORAL | Status: AC
  Administered 2018-10-22: 500 mg via ORAL
  Filled 2018-10-22: qty 1

## 2018-10-22 MED ORDER — CIPROFLOXACIN HCL 500 MG PO TABS
500.0000 mg | ORAL_TABLET | ORAL | Status: AC
  Administered 2018-10-22: 500 mg via ORAL
  Filled 2018-10-22: qty 1

## 2018-10-22 MED ORDER — IBUPROFEN 600 MG PO TABS: ORAL_TABLET | ORAL | 0 refills | Status: DC

## 2018-10-22 NOTE — Discharge Instructions (Signed)
We believe your symptoms are caused by diverticulitis.  Most of the time this condition (please read through the included information) can be cured with outpatient antibiotics.  Please take the full course of prescribed medication(s) and follow up with the doctors recommended above.  Return to the ED if your abdominal pain worsens or fails to improve, you develop bloody vomiting, bloody diarrhea, you are unable to tolerate fluids due to vomiting, fever greater than 101, or other symptoms that concern you.    

## 2018-10-22 NOTE — ED Notes (Signed)
Pt returned from CT. Assisted up to use the bathroom.

## 2018-10-22 NOTE — ED Notes (Signed)
Patient describes pain in the left lower quadrant. Has history of diverticulitis and states this feels similar. States last "good BM" was three days ago. Had small BM yesterday. States she got to "hurting so bad she just got up and had to come on." Denies N/V and also denies diarrhea. Patient is alert and oriented and able to answer questions appropriately. Patient tolerated IV insertion without incident and is stating she doesn't want any pain medication that is going to make her sleepy. She did drive herself here as she didn't want to bother her son. Patient is tender over the left lower quadrant and at times is guarding.

## 2018-10-22 NOTE — ED Triage Notes (Signed)
Patient c/o lower left abdominal pain. Patient reports hx of diverticulitis. Patient denies other symptoms.

## 2018-10-22 NOTE — ED Provider Notes (Signed)
[facility] Emergency Department Provider Note  ____________________________________________   First MD Initiated Contact with Patient 10/22/18 [phone]     (approximate)  I have reviewed the triage vital signs and the nursing notes.   HISTORY  Chief Complaint Abdominal Pain    HPI Yolanda Barker is a 63 y.o. female with a prior history of diverticulitis as well as other issues listed below.  She presents by private vehicle tonight for evaluation of gradually worsening left lower quadrant pain over the last 4 days.  She says it feels similar to her prior episodes of diverticulitis.  She initially had some nausea but has not had any over the last 24 hours and has had no vomiting.  She denies fever, sore throat, chest pain, shortness of breath, and dysuria.  She has had no contact with COVID-19 patients.  She reports that the pain in the left lower quadrant of her abdomen is sharp and aching and nothing in particular makes it better or worse.  She has had no diarrhea and has been able to eat.         Past Medical History:  Diagnosis Date  . Anemia   . Arthritis   . Diverticulitis   . GERD (gastroesophageal reflux disease)   . Vertigo     There are no active problems to display for this patient.   Past Surgical History:  Procedure Laterality Date  . ABDOMINAL HYSTERECTOMY    . COLONOSCOPY WITH PROPOFOL N/A 03/15/2016   Procedure: COLONOSCOPY WITH PROPOFOL;  Surgeon: Lollie Sails, MD;  Location: [facility] ENDOSCOPY;  Service: Endoscopy;  Laterality: N/A;  . SHOULDER ARTHROSCOPY WITH OPEN ROTATOR CUFF REPAIR Right 11/14/2017   Procedure: SHOULDER ARTHROSCOPY WITH OPEN ROTATOR CUFF REPAIR;  Surgeon: Corky Mull, MD;  Location: [facility] ORS;  Service: Orthopedics;  Laterality: Right;  . SHOULDER CLOSED REDUCTION Right 03/13/2018   Procedure: CLOSED MANIPULATION SHOULDER;  Surgeon: Corky Mull, MD;  Location: [facility] ORS;  Service: Orthopedics;  Laterality: Right;  .  STERIOD INJECTION Right 03/13/2018   Procedure: STEROID INJECTION RIGHT SHOULDER;  Surgeon: Corky Mull, MD;  Location: [facility] ORS;  Service: Orthopedics;  Laterality: Right;  . TONSILLECTOMY    . WISDOM TOOTH EXTRACTION      Prior to Admission medications   Medication Sig Start Date End Date Taking? Authorizing Provider  acetaminophen (TYLENOL) 500 MG tablet Take 500 mg by mouth every 6 (six) hours as needed for moderate pain or headache.     [provider]  brompheniramine-pseudoephedrine-DM 30-2-10 MG/5ML syrup Take 5 mLs by mouth 4 (four) times daily as needed. 03/18/18   Johnn Hai, PA-C  Cholecalciferol (VITAMIN D-1000 MAX ST) 1000 units tablet Take 1,000 Units by mouth daily.     [provider]  ciprofloxacin (CIPRO) 500 MG tablet Take 1 tablet (500 mg total) by mouth 2 (two) times daily for 14 days. 10/22/18 11/05/18  Hinda Kehr, MD  ferrous sulfate 325 (65 FE) MG tablet Take 325 mg by mouth daily.     [provider]  fexofenadine (ALLEGRA) 180 MG tablet Take 180 mg by mouth as needed.     [provider]  fluticasone (FLONASE ALLERGY RELIEF) 50 MCG/ACT nasal spray Place 2 sprays into both nostrils daily as needed for allergies.     [provider]  ibuprofen (ADVIL) 600 MG tablet Take 1 tablet by mouth three times daily with meals 10/22/18   Hinda Kehr, MD  meclizine Johnathan Hausen)  25 MG tablet Take 25 mg by mouth 3 (three) times daily as needed for dizziness.     [provider]  metroNIDAZOLE (FLAGYL) 500 MG tablet Take 1 tablet (500 mg total) by mouth every 8 (eight) hours for 14 days. 10/22/18 11/05/18  Hinda Kehr, MD  omeprazole (PRILOSEC) 20 MG capsule Take 20 mg by mouth 2 (two) times daily as needed (for acid reflux).     [provider]  ondansetron (ZOFRAN ODT) 4 MG disintegrating tablet Allow 1-2 tablets to dissolve in your mouth every 8 hours as needed for nausea/vomiting 10/22/18   Hinda Kehr, MD   Propylene Glycol (SYSTANE BALANCE) 0.6 % SOLN Place 1 drop into both eyes daily as needed (for dry eyes).    [provider]    Allergies Penicillin g  Family History  Problem Relation Age of Onset  . Bone cancer Mother   . Stroke Father   . Breast cancer Neg Hx     Social History Social History   Tobacco Use  . Smoking status: Former Smoker    Packs/day: 1.00    Years: 15.00    Pack years: 15.00    Types: Cigarettes    Quit date: 11/07/1997    Years since quitting: 20.9  . Smokeless tobacco: Never Used  Substance Use Topics  . Alcohol use: No  . Drug use: No    Review of Systems Constitutional: No fever/chills Eyes: No visual changes. ENT: No sore throat. Cardiovascular: Denies chest pain. Respiratory: Denies shortness of breath. Gastrointestinal: Left lower quadrant abdominal pain with transient nausea no vomiting and no diarrhea. Genitourinary: Negative for dysuria. Musculoskeletal: Negative for neck pain.  Negative for back pain. Integumentary: Negative for rash. Neurological: Negative for headaches, focal weakness or numbness.   ____________________________________________   PHYSICAL EXAM:  VITAL SIGNS: ED Triage Vitals  Enc Vitals Group     BP 10/22/18 0209 (!) 144/74     Pulse Rate 10/22/18 0209 (!) 58     Resp 10/22/18 0209 20     Temp 10/22/18 0209 97.8 F (36.6 C)     Temp Source 10/22/18 0209 Oral     SpO2 10/22/18 0209 99 %     Weight 10/22/18 0210 67.1 kg (148 lb)     Height 10/22/18 0210 1.499 m (4\' 11" )     Head Circumference --      Peak Flow --      Pain Score 10/22/18 0213 8     Pain Loc --      Pain Edu? --      Excl. in [place]? --     Constitutional: Alert and oriented.  Generally well-appearing and not in distress at this time unless she moves around. Eyes: Conjunctivae are normal.  Head: Atraumatic. Nose: No congestion/rhinnorhea. Mouth/Throat: Mucous membranes are moist. Neck: No stridor.  No meningeal signs.    Cardiovascular: Normal rate, regular rhythm. Good peripheral circulation. Grossly normal heart sounds. Respiratory: Normal respiratory effort.  No retractions. Gastrointestinal: Soft and nondistended.  Pressing on the right side of her abdomen elicits a little bit of tenderness on the left.  She has localized peritonitis on the left with some mild rebound and guarding and severe tenderness to palpation.  Her abdominal pain is also reproduced if she moves around too quickly in the bed. Musculoskeletal: No lower extremity tenderness nor edema. No gross deformities of extremities. Neurologic:  Normal speech and language. No gross focal neurologic deficits are appreciated.  Skin:  Skin is  warm, dry and intact. Psychiatric: Mood and affect are normal. Speech and behavior are normal.  ____________________________________________   LABS (all labs ordered are listed, but only abnormal results are displayed)  Labs Reviewed  COMPREHENSIVE METABOLIC PANEL - Abnormal; Notable for the following components:      Result Value   Glucose, Bld 103 (*)    All other components within normal limits  URINALYSIS, COMPLETE (UACMP) WITH MICROSCOPIC - Abnormal; Notable for the following components:   Color, Urine STRAW (*)    APPearance CLEAR (*)    Hgb urine dipstick SMALL (*)    All other components within normal limits  LIPASE, BLOOD  CBC   ____________________________________________  EKG  None - EKG not ordered by ED physician ____________________________________________  RADIOLOGY Ursula Alert, personally viewed and evaluated these images (plain radiographs) as part of my medical decision making, as well as reviewing the written report by the radiologist.  I also discussed the case by telephone with the radiologist.  ED MD interpretation: Acute diverticulitis, no perforation or abscess.  Chronic malrotation.  Official radiology report(s): Ct Abdomen Pelvis W Contrast  Result Date: 10/22/2018  CLINICAL DATA:  Abdominal pain with diverticulitis suspected EXAM: CT ABDOMEN AND PELVIS WITH CONTRAST TECHNIQUE: Multidetector CT imaging of the abdomen and pelvis was performed using the standard protocol following bolus administration of intravenous contrast. CONTRAST:  70mL OMNIPAQUE IOHEXOL 300 MG/ML  SOLN COMPARISON:  12/29/2015 FINDINGS: Lower chest:  No contributory findings. Hepatobiliary: No focal liver abnormality.No evidence of biliary obstruction or stone. Pancreas: Unremarkable. Spleen: Unremarkable. Adrenals/Urinary Tract: Negative adrenals. 4 renal calculi with the largest stone or stone conglomerate at the lower pole measuring 5 mm. Unremarkable bladder. Stomach/Bowel: Fat stranding and wall thickening at a diverticulum of the descending sigmoid junction. There is moderate left colonic diverticulosis. Malrotation with small-bowel clustered in the right abdomen and colon on the left. Ileocecal valve and appendix are in the deep pelvis. No bowel obstruction Vascular/Lymphatic: No acute vascular abnormality. No mass or adenopathy. Reproductive:Hysterectomy. Other: Trace pelvic fluid considered reactive. Musculoskeletal: No acute abnormalities.  Lumbar levoscoliosis. IMPRESSION: 1. Uncomplicated diverticulitis at the descending sigmoid junction. 2. Malrotated bowel. 3. Left nephrolithiasis. Electronically Signed   By: Monte Fantasia M.D.   On: 10/22/2018 05:19    ____________________________________________   PROCEDURES   Procedure(s) performed (including Critical Care):  Procedures   ____________________________________________   INITIAL IMPRESSION / MDM / [place] / ED COURSE  As part of my medical decision making, I reviewed the following data within the [facility] notes reviewed and incorporated, Labs reviewed , Old chart reviewed, Discussed with radiologist, Notes from prior ED visits and [place] Controlled Substance Database   Differential  diagnosis includes, but is not limited to, diverticulitis, biliary colic, appendicitis, adnexal pain, UTI/pyelonephritis.  Patient's vital signs are stable and she is afebrile and not tachycardic.  Her urine is clean without any evidence of infection and no significant hematuria.  She has a normal comprehensive metabolic panel and a normal CBC with no leukocytosis and a lipase within normal limits.  Signs and symptoms and history are most consistent with diverticulitis and I am concerned about her localized peritonitis.  I am obtaining a CT scan with IV contrast for further evaluation to look for any sign of perforation.  The patient does not want any narcotics because she drove herself so we will give Toradol 15 mg IV.  No signs of sepsis at this time.  Clinical Course as of Oct 21 605  [facility] Oct 22, 2018  0546 The patient has uncomplicated diverticulitis.  I spoke with the radiologist, Dr. Pascal Lux to discuss the malrotation he reported, and I confirmed that this is chronic and congenital and not representative of an acute issue.  I updated the patient about the results.  I was going to treat with high-dose Augmentin given the potential complications associated with Cipro, but she confirmed that she has a penicillin allergy.  She says that she has been treated in the past with Cipro and Flagyl, so I will give her a dose of both of them here in the emergency department and write a prescription for treatment.  I gave her strict return precautions if she has any worsening symptoms or if she is not feeling better within a couple of days.  She says that she understands and agrees with the plan.  I repeatedly tried to prescribe her any sort of pain medicine such as tramadol or Norco, but she adamantly refuses, so I am giving her prescription for ibuprofen which she should take with meals.  CT ABDOMEN PELVIS W CONTRAST [CF]    Clinical Course User Index [CF] Hinda Kehr, MD      ____________________________________________  FINAL CLINICAL IMPRESSION(S) / ED DIAGNOSES  Final diagnoses:  Diverticulitis     MEDICATIONS GIVEN DURING THIS VISIT:  Medications  ketorolac (TORADOL) 30 MG/ML injection 15 mg (15 mg Intravenous Given 10/22/18 0452)  iohexol (OMNIPAQUE) 300 MG/ML solution 75 mL (75 mLs Intravenous Contrast Given 10/22/18 0430)  ciprofloxacin (CIPRO) tablet 500 mg (500 mg Oral Given 10/22/18 0555)  metroNIDAZOLE (FLAGYL) tablet 500 mg (500 mg Oral Given 10/22/18 0555)     ED Discharge Orders         Ordered    ciprofloxacin (CIPRO) 500 MG tablet  2 times daily     10/22/18 0551    metroNIDAZOLE (FLAGYL) 500 MG tablet  Every 8 hours     10/22/18 0551    ondansetron (ZOFRAN ODT) 4 MG disintegrating tablet     10/22/18 0551    ibuprofen (ADVIL) 600 MG tablet     10/22/18 0551          *Please note:  Devory E Chick was evaluated in Emergency Department on 10/22/2018 for the symptoms described in the history of present illness. She was evaluated in the context of the global COVID-19 pandemic, which necessitated consideration that the patient might be at risk for infection with the SARS-CoV-2 virus that causes COVID-19. Institutional protocols and algorithms that pertain to the evaluation of patients at risk for COVID-19 are in a state of rapid change based on information released by regulatory bodies including the CDC and federal and state organizations. These policies and algorithms were followed during the patient's care in the ED.  Some ED evaluations and interventions may be delayed as a result of limited staffing during the pandemic.*  Note:  This document was prepared using Dragon voice recognition software and may include unintentional dictation errors.   Hinda Kehr, MD 10/22/18 [phone]

## 2018-10-22 NOTE — ED Notes (Signed)
Pt to CT

## 2018-11-01 ENCOUNTER — Encounter: Payer: Self-pay | Admitting: General Surgery

## 2018-11-01 ENCOUNTER — Other Ambulatory Visit: Payer: Self-pay

## 2018-11-01 ENCOUNTER — Ambulatory Visit (INDEPENDENT_AMBULATORY_CARE_PROVIDER_SITE_OTHER): Payer: Medicare Other | Admitting: General Surgery

## 2018-11-01 DIAGNOSIS — K5792 Diverticulitis of intestine, part unspecified, without perforation or abscess without bleeding: Secondary | ICD-10-CM | POA: Diagnosis not present

## 2018-11-01 NOTE — Progress Notes (Signed)
Patient ID: XYLIA SCHERGER, female   DOB: Oct 28, 1955, 63 y.o.   MRN: [id number]  Chief Complaint  Patient presents with  . New Patient (Initial Visit)    new pt seen in ER diverticulitis    HPI Yolanda E Armes is a 63 y.o. female.   She presents today as a referral from the emergency department.  She was seen there on August 10.  She presented there with approximately 4 days history of left lower quadrant pain.  It was not accompanied by fevers or chills.  She had some nausea with the initial onset of her symptoms, but had not had any for about 24 hours prior to presentation in the emergency department.  No vomiting.  She had been able to eat and had not had any diarrhea.  She denied dysuria or pneumaturia.  Her white blood cell count was normal, but a CT scan revealed evidence of diverticulitis, without perforation or abscess.  She was placed on oral antibiotics and discharged from the emergency department with follow-up to be arranged in our clinic.  She has had multiple episodes of diverticulitis in the past, all of them similar to the current one.   Today, she states that she has not been taking the antibiotics as prescribed, because they "make her feel bad".  She continues to experience some left lower quadrant pain that is alleviated with ibuprofen.  She is having daily bowel movements that are soft and formed without blood, mucus, or diarrhea.  She continues to deny fevers, chills, nausea, or vomiting.  She is current with her colonoscopy, having had her most recent one in January 2018 with Dr. Gustavo Lah.   Past Medical History:  Diagnosis Date  . Anemia   . Arthritis   . Diverticulitis   . GERD (gastroesophageal reflux disease)   . Vertigo     Past Surgical History:  Procedure Laterality Date  . ABDOMINAL HYSTERECTOMY    . COLONOSCOPY WITH PROPOFOL N/A 03/15/2016   Procedure: COLONOSCOPY WITH PROPOFOL;  Surgeon: Lollie Sails, MD;  Location: [facility] ENDOSCOPY;  Service: Endoscopy;   Laterality: N/A;  . SHOULDER ARTHROSCOPY WITH OPEN ROTATOR CUFF REPAIR Right 11/14/2017   Procedure: SHOULDER ARTHROSCOPY WITH OPEN ROTATOR CUFF REPAIR;  Surgeon: Corky Mull, MD;  Location: [facility] ORS;  Service: Orthopedics;  Laterality: Right;  . SHOULDER CLOSED REDUCTION Right 03/13/2018   Procedure: CLOSED MANIPULATION SHOULDER;  Surgeon: Corky Mull, MD;  Location: [facility] ORS;  Service: Orthopedics;  Laterality: Right;  . STERIOD INJECTION Right 03/13/2018   Procedure: STEROID INJECTION RIGHT SHOULDER;  Surgeon: Corky Mull, MD;  Location: [facility] ORS;  Service: Orthopedics;  Laterality: Right;  . TONSILLECTOMY    . WISDOM TOOTH EXTRACTION      Family History  Problem Relation Age of Onset  . Bone cancer Mother   . Stroke Father   . Breast cancer Neg Hx     Social History Social History   Tobacco Use  . Smoking status: Former Smoker    Packs/day: 1.00    Years: 15.00    Pack years: 15.00    Types: Cigarettes    Quit date: 11/07/1997    Years since quitting: 20.9  . Smokeless tobacco: Never Used  Substance Use Topics  . Alcohol use: No  . Drug use: No    Allergies  Allergen Reactions  . Penicillin G Rash and Other (See Comments)    Has patient had a PCN reaction causing immediate rash, facial/tongue/throat swelling, SOB  or lightheadedness with hypotension: Unknown Has patient had a PCN reaction causing severe rash involving mucus membranes or skin necrosis: Unknown Has patient had a PCN reaction that required hospitalization: No Has patient had a PCN reaction occurring within the last 10 years: No If all of the above answers are "NO", then may proceed with Cephalosporin use.     Current Outpatient Medications  Medication Sig Dispense Refill  . acetaminophen (TYLENOL) 500 MG tablet Take 500 mg by mouth every 6 (six) hours as needed for moderate pain or headache.     . Cholecalciferol (VITAMIN D-1000 MAX ST) 1000 units tablet Take 1,000 Units by mouth daily.     .  ciprofloxacin (CIPRO) 500 MG tablet Take 1 tablet (500 mg total) by mouth 2 (two) times daily for 14 days. 28 tablet 0  . ferrous sulfate 325 (65 FE) MG tablet Take 325 mg by mouth daily.     . fexofenadine (ALLEGRA) 180 MG tablet Take 180 mg by mouth as needed.     . fluticasone (FLONASE ALLERGY RELIEF) 50 MCG/ACT nasal spray Place 2 sprays into both nostrils daily as needed for allergies.     Marland Kitchen ibuprofen (ADVIL) 600 MG tablet Take 1 tablet by mouth three times daily with meals 15 tablet 0  . meclizine (ANTIVERT) 25 MG tablet Take 25 mg by mouth 3 (three) times daily as needed for dizziness.     . metroNIDAZOLE (FLAGYL) 500 MG tablet Take 1 tablet (500 mg total) by mouth every 8 (eight) hours for 14 days. 42 tablet 0  . omeprazole (PRILOSEC) 20 MG capsule Take 20 mg by mouth 2 (two) times daily as needed (for acid reflux).     . ondansetron (ZOFRAN ODT) 4 MG disintegrating tablet Allow 1-2 tablets to dissolve in your mouth every 8 hours as needed for nausea/vomiting 30 tablet 0  . Propylene Glycol (SYSTANE BALANCE) 0.6 % SOLN Place 1 drop into both eyes daily as needed (for dry eyes).     No current facility-administered medications for this visit.     Review of Systems Review of Systems  All other systems reviewed and are negative.   Blood pressure 140/82, pulse 75, temperature (!) 97.3 F (36.3 C), temperature source Temporal, height 5\' 1"  (1.549 m), weight 154 lb (69.9 kg), SpO2 98 %.  Physical Exam Physical Exam Constitutional:      General: She is not in acute distress.    Appearance: Normal appearance.  HENT:     Head: Normocephalic and atraumatic.     Nose:     Comments: Covered with a mask secondary to COVID-19 precautions    Mouth/Throat:     Comments: Covered with a mask secondary to COVID-19 precautions Eyes:     General: No scleral icterus.       Right eye: No discharge.        Left eye: No discharge.  Neck:     Musculoskeletal: Normal range of motion.      Comments: No palpable thyromegaly or dominant thyroid masses. Cardiovascular:     Rate and Rhythm: Normal rate and regular rhythm.  Pulmonary:     Effort: Pulmonary effort is normal.     Breath sounds: Normal breath sounds.  Abdominal:     General: Bowel sounds are normal.     Palpations: Abdomen is soft.     Tenderness: There is abdominal tenderness.     Comments: She has a lower midline incision from prior hysterectomy.  She is tender  to palpation in the left lower quadrant and suprapubic area.  There is no rebound, guarding, or other peritoneal signs present.  Genitourinary:    Comments: Deferred Musculoskeletal:        General: No tenderness or deformity.  Lymphadenopathy:     Cervical: No cervical adenopathy.  Skin:    General: Skin is warm and dry.  Neurological:     General: No focal deficit present.     Mental Status: She is alert and oriented to person, place, and time.  Psychiatric:        Mood and Affect: Mood normal.        Behavior: Behavior normal.     Data Reviewed I reviewed the notes from her emergency department visit on the 10th.  These give a history of at least 2 prior episodes of diverticulitis.  I also reviewed the labs and CT scan personally as discussed under the history of present illness.  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  I agree with the radiologist findings of diverticulitis.  Assessment This is a 63 year old woman who has had at least 2 prior episodes of diverticulitis, now with a third episode.  She is currently taking oral antibiotics, though not as prescribed, for uncomplicated diverticulitis.  She would likely benefit from sigmoid colectomy to prevent further episodes or more complicated events such as perforation or abscess.  This may reduce the likelihood that she would need a colostomy, if surgery is performed.  Plan Ms. Stankowski was very reluctant to definitively schedule surgical intervention.  She says that we will need to discuss  it more with her children.  I encouraged her to complete her antibiotic course and take the medication as prescribed.  For now, I have scheduled her for a CT scan to be done in about 6 weeks time, with a follow-up visit with me thereafter.  This should allow the current episode of inflammation and infection to resolve, permitting a one-step procedure, should surgery be pursued.  If her pain persists after she completes her current antibiotic regimen, she should contact our office and we will prescribe her another round of antibiotics.    Fredirick Maudlin 11/01/2018, 4:22 PM

## 2018-11-01 NOTE — Patient Instructions (Addendum)
The patient is aware to call back for any questions or new concerns.  CT scan and follow up in 6 weeks 12-10-18 AT 9:00  Diverticulitis  Diverticulitis is when small pockets in your large intestine (colon) get infected or swollen. This causes stomach pain and watery poop (diarrhea). These pouches are called diverticula. They form in people who have a condition called diverticulosis. Follow these instructions at home: Medicines  Take over-the-counter and prescription medicines only as told by your doctor. These include: ? Antibiotics. ? Pain medicines. ? Fiber pills. ? Probiotics. ? Stool softeners.  Do not drive or use heavy machinery while taking prescription pain medicine.  If you were prescribed an antibiotic, take it as told. Do not stop taking it even if you feel better. General instructions   Follow a diet as told by your doctor.  When you feel better, your doctor may tell you to change your diet. You may need to eat a lot of fiber. Fiber makes it easier to poop (have bowel movements). Healthy foods with fiber include: ? Berries. ? Beans. ? Lentils. ? Green vegetables.  Exercise 3 or more times a week. Aim for 30 minutes each time. Exercise enough to sweat and make your heart beat faster.  Keep all follow-up visits as told. This is important. You may need to have an exam of the large intestine. This is called a colonoscopy. Contact a doctor if:  Your pain does not get better.  You have a hard time eating or drinking.  You are not pooping like normal. Get help right away if:  Your pain gets worse.  Your problems do not get better.  Your problems get worse very fast.  You have a fever.  You throw up (vomit) more than one time.  You have poop that is: ? Bloody. ? Black. ? Tarry. Summary  Diverticulitis is when small pockets in your large intestine (colon) get infected or swollen.  Take medicines only as told by your doctor.  Follow a diet as told by  your doctor. This information is not intended to replace advice given to you by your health care provider. Make sure you discuss any questions you have with your health care provider. Document Released: 08/17/2007 Document Revised: 02/10/2017 Document Reviewed: 03/17/2016 Elsevier Patient Education  2020 [employer].

## 2018-11-22 ENCOUNTER — Ambulatory Visit: Payer: BLUE CROSS/BLUE SHIELD

## 2018-12-10 ENCOUNTER — Other Ambulatory Visit: Payer: Self-pay

## 2018-12-10 ENCOUNTER — Ambulatory Visit
Admission: RE | Admit: 2018-12-10 | Discharge: 2018-12-10 | Disposition: A | Discharge status: Home / Self Care | Payer: Medicare Other | Source: Ambulatory Visit | Attending: General Surgery | Admitting: General Surgery

## 2018-12-10 DIAGNOSIS — K5792 Diverticulitis of intestine, part unspecified, without perforation or abscess without bleeding: Secondary | ICD-10-CM

## 2018-12-10 LAB — POCT I-STAT CREATININE: Creatinine, Ser: 0.9 mg/dL (ref 0.44–1.00)

## 2018-12-10 MED ORDER — IOHEXOL 300 MG/ML  SOLN
100.0000 mL | Freq: Once | INTRAMUSCULAR | Status: AC | PRN
  Administered 2018-12-10: 100 mL via INTRAVENOUS

## 2018-12-18 ENCOUNTER — Encounter: Payer: Self-pay | Admitting: General Surgery

## 2018-12-18 ENCOUNTER — Ambulatory Visit (INDEPENDENT_AMBULATORY_CARE_PROVIDER_SITE_OTHER): Payer: Medicare Other | Admitting: General Surgery

## 2018-12-18 ENCOUNTER — Other Ambulatory Visit: Payer: Self-pay

## 2018-12-18 VITALS — BP 132/69 | HR 78 | Temp 97.7°F | Ht 59.0 in | Wt 154.2 lb

## 2018-12-18 DIAGNOSIS — K5792 Diverticulitis of intestine, part unspecified, without perforation or abscess without bleeding: Secondary | ICD-10-CM

## 2018-12-18 NOTE — Progress Notes (Signed)
Patient ID: Yolanda Barker, female   DOB: 01-20-1956, 63 y.o.   MRN: [id number]  Chief Complaint  Patient presents with  . Follow-up    CT scan 12/10/18    HPI Yolanda Barker is a 63 y.o. female.   I initially saw her in August of this year.  My prior history of present illness is copied here:  "She presents today as a referral from the emergency department.  She was seen there on August 10.  She presented there with approximately 4 days history of left lower quadrant pain.  It was not accompanied by fevers or chills.  She had some nausea with the initial onset of her symptoms, but had not had any for about 24 hours prior to presentation in the emergency department.  No vomiting.  She had been able to eat and had not had any diarrhea.  She denied dysuria or pneumaturia.  Her white blood cell count was normal, but a CT scan revealed evidence of diverticulitis, without perforation or abscess.  She was placed on oral antibiotics and discharged from the emergency department with follow-up to be arranged in our clinic.  She has had multiple episodes of diverticulitis in the past, all of them similar to the current one.   Today, she states that she has not been taking the antibiotics as prescribed, because they "make her feel bad".  She continues to experience some left lower quadrant pain that is alleviated with ibuprofen.  She is having daily bowel movements that are soft and formed without blood, mucus, or diarrhea.  She continues to deny fevers, chills, nausea, or vomiting.  She is current with her colonoscopy, having had her most recent one in January 2018 with Dr. Gustavo Lah."   After our visit, she was able to complete her course of antibiotics.  She underwent a CT scan last week and is here to discuss the results today.  She states that she is doing much better.  She denies any nausea or vomiting.  No fevers or chills.  Her appetite is normal.  She is having normal formed bowel movements and states that  her pain is markedly improved.  She does admit to slight tenderness in her left lower quadrant.   Past Medical History:  Diagnosis Date  . Anemia   . Arthritis   . Diverticulitis   . GERD (gastroesophageal reflux disease)   . Vertigo     Past Surgical History:  Procedure Laterality Date  . ABDOMINAL HYSTERECTOMY    . COLONOSCOPY WITH PROPOFOL N/A 03/15/2016   Procedure: COLONOSCOPY WITH PROPOFOL;  Surgeon: Lollie Sails, MD;  Location: [facility] ENDOSCOPY;  Service: Endoscopy;  Laterality: N/A;  . SHOULDER ARTHROSCOPY WITH OPEN ROTATOR CUFF REPAIR Right 11/14/2017   Procedure: SHOULDER ARTHROSCOPY WITH OPEN ROTATOR CUFF REPAIR;  Surgeon: Corky Mull, MD;  Location: [facility] ORS;  Service: Orthopedics;  Laterality: Right;  . SHOULDER CLOSED REDUCTION Right 03/13/2018   Procedure: CLOSED MANIPULATION SHOULDER;  Surgeon: Corky Mull, MD;  Location: [facility] ORS;  Service: Orthopedics;  Laterality: Right;  . STERIOD INJECTION Right 03/13/2018   Procedure: STEROID INJECTION RIGHT SHOULDER;  Surgeon: Corky Mull, MD;  Location: [facility] ORS;  Service: Orthopedics;  Laterality: Right;  . TONSILLECTOMY    . WISDOM TOOTH EXTRACTION      Family History  Problem Relation Age of Onset  . Bone cancer Mother   . Stroke Father   . Breast cancer Neg Hx     Social  History Social History   Tobacco Use  . Smoking status: Former Smoker    Packs/day: 1.00    Years: 15.00    Pack years: 15.00    Types: Cigarettes    Quit date: 11/07/1997    Years since quitting: 21.1  . Smokeless tobacco: Never Used  Substance Use Topics  . Alcohol use: No  . Drug use: No    Allergies  Allergen Reactions  . Penicillin G Rash and Other (See Comments)    Has patient had a PCN reaction causing immediate rash, facial/tongue/throat swelling, SOB or lightheadedness with hypotension: Unknown Has patient had a PCN reaction causing severe rash involving mucus membranes or skin necrosis: Unknown Has patient had a PCN  reaction that required hospitalization: No Has patient had a PCN reaction occurring within the last 10 years: No If all of the above answers are "NO", then may proceed with Cephalosporin use.     Current Outpatient Medications  Medication Sig Dispense Refill  . acetaminophen (TYLENOL) 500 MG tablet Take 500 mg by mouth every 6 (six) hours as needed for moderate pain or headache.     . Cholecalciferol (VITAMIN D-1000 MAX ST) 1000 units tablet Take 1,000 Units by mouth daily.     . ferrous sulfate 325 (65 FE) MG tablet Take 325 mg by mouth daily.     . fexofenadine (ALLEGRA) 180 MG tablet Take 180 mg by mouth as needed.     . fluticasone (FLONASE ALLERGY RELIEF) 50 MCG/ACT nasal spray Place 2 sprays into both nostrils daily as needed for allergies.     Marland Kitchen ibuprofen (ADVIL) 600 MG tablet Take 1 tablet by mouth three times daily with meals 15 tablet 0  . meclizine (ANTIVERT) 25 MG tablet Take 25 mg by mouth 3 (three) times daily as needed for dizziness.     . ondansetron (ZOFRAN ODT) 4 MG disintegrating tablet Allow 1-2 tablets to dissolve in your mouth every 8 hours as needed for nausea/vomiting 30 tablet 0  . Propylene Glycol (SYSTANE BALANCE) 0.6 % SOLN Place 1 drop into both eyes daily as needed (for dry eyes).    Marland Kitchen omeprazole (PRILOSEC) 20 MG capsule Take 20 mg by mouth 2 (two) times daily as needed (for acid reflux).      No current facility-administered medications for this visit.     Review of Systems Review of Systems  All other systems reviewed and are negative.   Blood pressure 132/69, pulse 78, temperature 97.7 F (36.5 C), temperature source Temporal, height 4\' 11"  (1.499 m), weight 154 lb 3.2 oz (69.9 kg), SpO2 97 %.  Body mass index is 31.14 kg/m.   Physical Exam Physical Exam Vitals signs reviewed.  Constitutional:      General: She is not in acute distress.    Appearance: Normal appearance. She is obese.  HENT:     Head: Normocephalic and atraumatic.     Nose:      Comments: Covered with a mask secondary to COVID-19 precautions    Mouth/Throat:     Comments: Covered with a mask secondary to COVID-19 precautions Eyes:     General: No scleral icterus.       Right eye: No discharge.        Left eye: No discharge.     Conjunctiva/sclera: Conjunctivae normal.  Neck:     Musculoskeletal: Normal range of motion. No neck rigidity.     Comments: No palpable thyromegaly or dominant thyroid masses are appreciated. Cardiovascular:  Rate and Rhythm: Normal rate and regular rhythm.     Pulses: Normal pulses.     Heart sounds: Normal heart sounds.  Pulmonary:     Effort: Pulmonary effort is normal.     Breath sounds: Normal breath sounds.  Abdominal:     Palpations: Abdomen is soft.       Comments: Vertical lower midline scar from prior hysterectomy.  There is mild tenderness to deep palpation in the left lower quadrant.  There is no rebound, guarding, or other peritoneal signs.  Genitourinary:    Comments: Deferred Musculoskeletal:        General: No swelling.  Lymphadenopathy:     Cervical: No cervical adenopathy.  Skin:    General: Skin is warm and dry.  Neurological:     General: No focal deficit present.     Mental Status: She is alert and oriented to person, place, and time.  Psychiatric:        Mood and Affect: Mood normal.        Behavior: Behavior normal.     Data Reviewed I reviewed the CT scan of the abdomen and pelvis that was performed on October 22, 2018, demonstrating diverticulitis.  The more recent scan, done 28 September, shows resolution.  The radiologist formal report is copied here.  I am in agreement with these findings.  FINDINGS: Lower chest: No acute abnormality.  Hepatobiliary: No focal liver abnormality is seen. No gallstones, gallbladder wall thickening, or biliary dilatation.  Pancreas: Unremarkable. No pancreatic ductal dilatation or surrounding inflammatory changes.  Spleen: Normal in size without focal  abnormality.  Adrenals/Urinary Tract: Adrenal glands are within normal limits. Kidneys are well visualized within normal enhancement pattern. Scattered nonobstructing left renal calculi are noted. These are stable in appearance from the prior exam. No ureteral stones are seen. The bladder is decompressed.  Stomach/Bowel: Now rotation is noted with the small-bowel clustered in the right abdomen and the colon in the midline and left abdomen. The appendix and cecum are deep within the pelvis but otherwise within normal limits. Scattered diverticular change is seen. The previously seen changes of diverticulitis have resolved in the interval. No abscess or findings of perforation are noted.  Vascular/Lymphatic: Aortic atherosclerosis. No enlarged abdominal or pelvic lymph nodes.  Reproductive: Status post hysterectomy. No adnexal masses.  Other: No abdominal wall hernia or abnormality. No abdominopelvic ascites.  Musculoskeletal: Stable scoliosis of the lumbar spine is noted.  IMPRESSION: Changes consistent with diverticulosis primarily within the sigmoid colon. Recent changes of diverticulitis have resolved in the interval.  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  Nonobstructing left renal calculi.   Electronically Signed   By: Inez Catalina M.D.   On: 12/10/2018 09:24  Assessment This is a 63 year old woman who has had 3 episodes of acute diverticulitis.  Fortunately, none of these have been complicated.  Her most recent episode appears to have resolved with oral antibiotic therapy.  I have recommended that she undergo elective sigmoid colectomy.  She is very concerned about her financial status at this time.  Due to the pandemic, she has not been able to work and is concerned about taking additional time off for surgery and her recovery.  Plan I spent greater than 50% of our 30-minute office visit today, discussing the options for managing her disease.   As stated above, she is very concerned about the need to work and pay her bills at this time rather than undergoing surgery.  I am  concerned that she could have a complicated episode in the future which might necessitate an emergent procedure and ostomy, which she would certainly prefer to avoid.  However, I also empathized with the realities of life and her current social economic status.  We agreed that she would contact me in the next couple of months, if she feels ready to undergo surgery.  Otherwise, I have made a follow-up appointment for 6 months from now just to touch base and make sure that she has not lost to follow-up.    Fredirick Maudlin 12/18/2018, 11:00 AM

## 2018-12-18 NOTE — Patient Instructions (Signed)
Follow up here in 6 months. Call with any questions or if you would like to go ahead and schedule surgery.   Laparoscopic Colectomy Laparoscopic colectomy is surgery to remove part or all of the large intestine (colon). This procedure may be used to treat several conditions, including:  Inflammation and infection of the colon (diverticulitis).  Tumors or masses in the colon.  Inflammatory bowel disease, such as Crohn disease or ulcerative colitis. Colectomy is an option when symptoms cannot be controlled with medicines.  Bleeding from the colon that cannot be controlled by another method.  Blockage or obstruction of the colon. Tell a health care provider about:  Any allergies you have.  All medicines you are taking, including vitamins, herbs, eye drops, creams, and over-the-counter medicines.  Any problems you or family members have had with anesthetic medicines.  Any blood disorders you have.  Any surgeries you have had.  Any medical conditions you have. What are the risks? Generally, this is a safe procedure. However, problems may occur, including:  Infection.  Bleeding.  Allergic reactions to medicines or dyes.  Damage to other structures or organs.  Leaking from where the colon was sewn together.  Future blockage of the small intestines from scar tissue. Another surgery may be needed to repair this.  Needing to convert to an open procedure. Complications such as damage to other organs or excessive bleeding may require the surgeon to convert from a laparoscopic procedure to an open procedure. This involves making a larger incision in the abdomen. What happens before the procedure? Staying hydrated Follow instructions from your health care provider about hydration, which may include:  Up to 2 hours before the procedure - you may continue to drink clear liquids, such as water, clear fruit juice, black coffee, and plain tea. Eating and drinking restrictions Follow  instructions from your health care provider about eating and drinking, which may include:  8 hours before the procedure - stop eating heavy meals, meals with high fiber, or foods such as meat, fried foods, or fatty foods.  6 hours before the procedure - stop eating light meals or foods, such as toast or cereal.  6 hours before the procedure - stop drinking milk or drinks that contain milk.  2 hours before the procedure - stop drinking clear liquids. Medicines  Ask your health care provider about: ? Changing or stopping your regular medicines. This is especially important if you are taking diabetes medicines or blood thinners. ? Taking medicines such as aspirin and ibuprofen. These medicines can thin your blood. Do not take these medicines before your procedure if your health care provider instructs you not to.  You may be given antibiotic medicine to clean out bacteria from your colon. Follow the directions carefully and take the medicine at the correct time. General instructions  You may be prescribed an oral bowel prep to clean out your colon in preparation for the surgery: ? Follow instructions from your health care provider about how to do this. ? Do not eat or drink anything else after you have started the bowel prep, unless your health care provider tells you it is safe to do so.  Do not use any products that contain nicotine or tobacco, such as cigarettes and e-cigarettes. If you need help quitting, ask your health care provider. What happens during the procedure?  To reduce your risk of infection: ? Your health care team will wash or sanitize their hands. ? Your skin will be washed with soap.  An IV tube will be inserted into one of your veins to deliver fluid and medication.  You will be given one of the following: ? A medicine to help you relax (sedative). ? A medicine to make you fall asleep (general anesthetic).  Small monitors will be connected to your body. They will  be used to check your heart, blood pressure, and oxygen level.  A breathing tube may be placed into your lungs during the procedure.  A thin, flexible tube (catheter) will be placed into your bladder to drain urine.  A tube may be placed through your nose and into your stomach to drain stomach fluids (nasogastric tube, or NG tube).  Your abdomen will be filled with air so it expands. This gives the surgeon more room to operate and makes your organs easier to see.  Several small cuts (incisions) will be made in your abdomen.  A thin, lighted tube with a tiny camera on the end (laparoscope) will be put through one of the small incisions. The camera on the laparoscope will send a picture to a computer screen in the operating room. This will give the surgeon a good view inside your abdomen.  Hollow tubes will be put through the other small incisions in your abdomen. The tools that are needed for the procedure will be put through these tubes.  Clamps or staples will be put on both ends of the diseased part of the colon.  The part of the intestine between the clamps or staples will be removed.  If possible, the ends of the healthy colon that remain will be stitched (sutured) or stapled together to allow your body to pass waste (stool).  Sometimes, the remaining colon cannot be stitched back together. If this is the case, a colostomy will be needed. If you need a colostomy: ? An opening to the outside of your body (stoma) will be made through your abdomen. ? The end of your colon will be brought to the opening. It will be stitched to the skin. ? A bag will be attached to the opening. Stool will drain into this removable bag. ? The colostomy may be temporary or permanent.  The incisions from the colectomy will be closed with sutures or staples. The procedure may vary among health care providers and hospitals. What happens after the procedure?  Your blood pressure, heart rate, breathing rate,  and blood oxygen level will be monitored until the medicines you were given have worn off.  You will receive fluids through an IV tube until your bowels start to work properly.  Once your bowels are working again, you will be given clear liquids first and then solid food as tolerated.  You will be given medicines to control your pain and nausea, if needed.  Do not drive for 24 hours if you were given a sedative. This information is not intended to replace advice given to you by your health care provider. Make sure you discuss any questions you have with your health care provider. Document Released: 05/21/2002 Document Revised: 02/10/2017 Document Reviewed: 11/30/2015 Elsevier Patient Education  2020 [employer].    Diverticulitis  Diverticulitis is infection or inflammation of small pouches (diverticula) in the colon that form due to a condition called diverticulosis. Diverticula can trap stool (feces) and bacteria, causing infection and inflammation. Diverticulitis may cause severe stomach pain and diarrhea. It may lead to tissue damage in the colon that causes bleeding. The diverticula may also burst (rupture) and cause infected stool to  enter other areas of the abdomen. Complications of diverticulitis can include:  Bleeding.  Severe infection.  Severe pain.  Rupture (perforation) of the colon.  Blockage (obstruction) of the colon. What are the causes? This condition is caused by stool becoming trapped in the diverticula, which allows bacteria to grow in the diverticula. This leads to inflammation and infection. What increases the risk? You are more likely to develop this condition if:  You have diverticulosis. The risk for diverticulosis increases if: ? You are overweight or obese. ? You use tobacco products. ? You do not get enough exercise.  You eat a diet that does not include enough fiber. High-fiber foods include fruits, vegetables, beans, nuts, and whole grains.  What are the signs or symptoms? Symptoms of this condition may include:  Pain and tenderness in the abdomen. The pain is normally located on the left side of the abdomen, but it may occur in other areas.  Fever and chills.  Bloating.  Cramping.  Nausea.  Vomiting.  Changes in bowel routines.  Blood in your stool. How is this diagnosed? This condition is diagnosed based on:  Your medical history.  A physical exam.  Tests to make sure there is nothing else causing your condition. These tests may include: ? Blood tests. ? Urine tests. ? Imaging tests of the abdomen, including X-rays, ultrasounds, MRIs, or CT scans. How is this treated? Most cases of this condition are mild and can be treated at home. Treatment may include:  Taking over-the-counter pain medicines.  Following a clear liquid diet.  Taking antibiotic medicines by mouth.  Rest. More severe cases may need to be treated at a hospital. Treatment may include:  Not eating or drinking.  Taking prescription pain medicine.  Receiving antibiotic medicines through an IV tube.  Receiving fluids and nutrition through an IV tube.  Surgery. When your condition is under control, your health care provider may recommend that you have a colonoscopy. This is an exam to look at the entire large intestine. During the exam, a lubricated, bendable tube is inserted into the anus and then passed into the rectum, colon, and other parts of the large intestine. A colonoscopy can show how severe your diverticula are and whether something else may be causing your symptoms. Follow these instructions at home: Medicines  Take over-the-counter and prescription medicines only as told by your health care provider. These include fiber supplements, probiotics, and stool softeners.  If you were prescribed an antibiotic medicine, take it as told by your health care provider. Do not stop taking the antibiotic even if you start to feel  better.  Do not drive or use heavy machinery while taking prescription pain medicine. General instructions   Follow a full liquid diet or another diet as directed by your health care provider. After your symptoms improve, your health care provider may tell you to change your diet. He or she may recommend that you eat a diet that contains at least 25 g (25 grams) of fiber daily. Fiber makes it easier to pass stool. Healthy sources of fiber include: ? Berries. One cup contains 4-8 grams of fiber. ? Beans or lentils. One half cup contains 5-8 grams of fiber. ? Green vegetables. One cup contains 4 grams of fiber.  Exercise for at least 30 minutes, 3 times each week. You should exercise hard enough to raise your heart rate and break a sweat.  Keep all follow-up visits as told by your health care provider. This is important.  You may need a colonoscopy. Contact a health care provider if:  Your pain does not improve.  You have a hard time drinking or eating food.  Your bowel movements do not return to normal. Get help right away if:  Your pain gets worse.  Your symptoms do not get better with treatment.  Your symptoms suddenly get worse.  You have a fever.  You vomit more than one time.  You have stools that are bloody, black, or tarry. Summary  Diverticulitis is infection or inflammation of small pouches (diverticula) in the colon that form due to a condition called diverticulosis. Diverticula can trap stool (feces) and bacteria, causing infection and inflammation.  You are at higher risk for this condition if you have diverticulosis and you eat a diet that does not include enough fiber.  Most cases of this condition are mild and can be treated at home. More severe cases may need to be treated at a hospital.  When your condition is under control, your health care provider may recommend that you have an exam called a colonoscopy. This exam can show how severe your diverticula are  and whether something else may be causing your symptoms. This information is not intended to replace advice given to you by your health care provider. Make sure you discuss any questions you have with your health care provider. Document Released: 12/08/2004 Document Revised: 02/10/2017 Document Reviewed: 04/02/2016 Elsevier Patient Education  2020 [employer].

## 2019-02-25 ENCOUNTER — Other Ambulatory Visit: Payer: Self-pay | Admitting: Family Medicine

## 2019-02-25 DIAGNOSIS — Z1231 Encounter for screening mammogram for malignant neoplasm of breast: Secondary | ICD-10-CM

## 2019-04-05 ENCOUNTER — Ambulatory Visit
Admission: RE | Admit: 2019-04-05 | Discharge: 2019-04-05 | Disposition: A | Discharge status: Home / Self Care | Payer: Medicare Other | Source: Ambulatory Visit | Attending: Family Medicine | Admitting: Family Medicine

## 2019-04-05 DIAGNOSIS — Z1231 Encounter for screening mammogram for malignant neoplasm of breast: Secondary | ICD-10-CM | POA: Diagnosis present

## 2019-06-27 ENCOUNTER — Ambulatory Visit: Payer: Medicare Other | Admitting: General Surgery

## 2019-07-29 ENCOUNTER — Other Ambulatory Visit: Payer: Self-pay | Admitting: Sports Medicine

## 2019-07-29 DIAGNOSIS — M5030 Other cervical disc degeneration, unspecified cervical region: Secondary | ICD-10-CM

## 2019-07-29 DIAGNOSIS — M542 Cervicalgia: Secondary | ICD-10-CM

## 2019-07-29 DIAGNOSIS — M5412 Radiculopathy, cervical region: Secondary | ICD-10-CM

## 2019-07-29 DIAGNOSIS — M62838 Other muscle spasm: Secondary | ICD-10-CM

## 2019-08-11 ENCOUNTER — Other Ambulatory Visit: Payer: Self-pay

## 2019-08-11 ENCOUNTER — Ambulatory Visit
Admission: RE | Admit: 2019-08-11 | Discharge: 2019-08-11 | Disposition: A | Discharge status: Home / Self Care | Payer: Medicare Other | Source: Ambulatory Visit | Attending: Sports Medicine | Admitting: Sports Medicine

## 2019-08-11 DIAGNOSIS — M542 Cervicalgia: Secondary | ICD-10-CM | POA: Diagnosis present

## 2019-08-11 DIAGNOSIS — M5412 Radiculopathy, cervical region: Secondary | ICD-10-CM | POA: Insufficient documentation

## 2019-08-11 DIAGNOSIS — M5030 Other cervical disc degeneration, unspecified cervical region: Secondary | ICD-10-CM | POA: Insufficient documentation

## 2019-08-11 DIAGNOSIS — M62838 Other muscle spasm: Secondary | ICD-10-CM | POA: Diagnosis present

## 2019-08-24 ENCOUNTER — Emergency Department: Payer: Medicare Other

## 2019-08-24 ENCOUNTER — Encounter: Payer: Self-pay | Admitting: Emergency Medicine

## 2019-08-24 ENCOUNTER — Emergency Department
Admission: EM | Admit: 2019-08-24 | Discharge: 2019-08-24 | Disposition: A | Discharge status: Home / Self Care | Payer: Medicare Other | Attending: Emergency Medicine | Admitting: Emergency Medicine

## 2019-08-24 ENCOUNTER — Other Ambulatory Visit: Payer: Self-pay

## 2019-08-24 DIAGNOSIS — Z87891 Personal history of nicotine dependence: Secondary | ICD-10-CM | POA: Diagnosis not present

## 2019-08-24 DIAGNOSIS — Z79899 Other long term (current) drug therapy: Secondary | ICD-10-CM | POA: Insufficient documentation

## 2019-08-24 DIAGNOSIS — M25511 Pain in right shoulder: Secondary | ICD-10-CM | POA: Insufficient documentation

## 2019-08-24 DIAGNOSIS — T148XXA Other injury of unspecified body region, initial encounter: Secondary | ICD-10-CM

## 2019-08-24 DIAGNOSIS — M546 Pain in thoracic spine: Secondary | ICD-10-CM | POA: Diagnosis present

## 2019-08-24 MED ORDER — METHOCARBAMOL 500 MG PO TABS: 500.0000 mg | ORAL_TABLET | Freq: Two times a day (BID) | ORAL | 0 refills | Status: DC | PRN

## 2019-08-24 MED ORDER — LIDOCAINE 5 % EX PTCH
1.0000 | MEDICATED_PATCH | CUTANEOUS | Status: DC
  Administered 2019-08-24: 1 via TRANSDERMAL
  Filled 2019-08-24: qty 1

## 2019-08-24 MED ORDER — TRAMADOL HCL 50 MG PO TABS
50.0000 mg | ORAL_TABLET | Freq: Once | ORAL | Status: DC
  Filled 2019-08-24: qty 1

## 2019-08-24 MED ORDER — METHOCARBAMOL 500 MG PO TABS
500.0000 mg | ORAL_TABLET | Freq: Once | ORAL | Status: AC
  Administered 2019-08-24: 500 mg via ORAL
  Filled 2019-08-24: qty 1

## 2019-08-24 MED ORDER — LIDOCAINE 5 % EX PTCH
1.0000 | MEDICATED_PATCH | CUTANEOUS | 0 refills | Status: DC
Start: 2019-08-24 — End: 2020-10-04

## 2019-08-24 NOTE — ED Provider Notes (Signed)
-----------------------------------------   11:23 AM on 08/24/2019 -----------------------------------------  EKG viewed and interpreted by myself shows a sinus bradycardia 51 bpm with a narrow QRS, normal axis, normal intervals, no concerning ST changes. Reassuring EKG.   Harvest Dark, MD 08/24/19 1124

## 2019-08-24 NOTE — ED Provider Notes (Signed)
[facility] Emergency Department Provider Note  ____________________________________________  Time seen: Approximately 11:04 AM  I have reviewed the triage vital signs and the nursing notes.   HISTORY  Chief Complaint Back Pain    HPI Yolanda Barker is a 64 y.o. female that presents to the emergency department for evaluation of mid right upper back pain. Pain is worse when she moves her right shoulder. Pain is improved when she relaxes. She does not recall any trauma.  Patient saw her doctor on Thursday for neck pain and was not started on any medications yet. No fevers, SOB, CP, pleuritic pain, urinary symptoms.    Past Medical History:  Diagnosis Date  . Anemia   . Arthritis   . Diverticulitis   . GERD (gastroesophageal reflux disease)   . Vertigo     There are no problems to display for this patient.   Past Surgical History:  Procedure Laterality Date  . ABDOMINAL HYSTERECTOMY    . COLONOSCOPY WITH PROPOFOL N/A 03/15/2016   Procedure: COLONOSCOPY WITH PROPOFOL;  Surgeon: Lollie Sails, MD;  Location: [facility] ENDOSCOPY;  Service: Endoscopy;  Laterality: N/A;  . SHOULDER ARTHROSCOPY WITH OPEN ROTATOR CUFF REPAIR Right 11/14/2017   Procedure: SHOULDER ARTHROSCOPY WITH OPEN ROTATOR CUFF REPAIR;  Surgeon: Corky Mull, MD;  Location: [facility] ORS;  Service: Orthopedics;  Laterality: Right;  . SHOULDER CLOSED REDUCTION Right 03/13/2018   Procedure: CLOSED MANIPULATION SHOULDER;  Surgeon: Corky Mull, MD;  Location: [facility] ORS;  Service: Orthopedics;  Laterality: Right;  . STERIOD INJECTION Right 03/13/2018   Procedure: STEROID INJECTION RIGHT SHOULDER;  Surgeon: Corky Mull, MD;  Location: [facility] ORS;  Service: Orthopedics;  Laterality: Right;  . TONSILLECTOMY    . WISDOM TOOTH EXTRACTION      Prior to Admission medications   Medication Sig Start Date End Date Taking? Authorizing Provider  acetaminophen (TYLENOL) 500 MG tablet Take 500 mg by mouth every 6  (six) hours as needed for moderate pain or headache.     [provider]  Cholecalciferol (VITAMIN D-1000 MAX ST) 1000 units tablet Take 1,000 Units by mouth daily.     [provider]  ferrous sulfate 325 (65 FE) MG tablet Take 325 mg by mouth daily.     [provider]  fexofenadine (ALLEGRA) 180 MG tablet Take 180 mg by mouth as needed.     [provider]  fluticasone (FLONASE ALLERGY RELIEF) 50 MCG/ACT nasal spray Place 2 sprays into both nostrils daily as needed for allergies.     [provider]  ibuprofen (ADVIL) 600 MG tablet Take 1 tablet by mouth three times daily with meals 10/22/18   Hinda Kehr, MD  lidocaine (LIDODERM) 5 % Place 1 patch onto the skin daily. Remove & Discard patch within 12 hours or as directed by MD 08/24/19   Laban Emperor, PA-C  meclizine (ANTIVERT) 25 MG tablet Take 25 mg by mouth 3 (three) times daily as needed for dizziness.     [provider]  methocarbamol (ROBAXIN) 500 MG tablet Take 1 tablet (500 mg total) by mouth 2 (two) times daily as needed for muscle spasms. 08/24/19   Laban Emperor, PA-C  omeprazole (PRILOSEC) 20 MG capsule Take 20 mg by mouth 2 (two) times daily as needed (for acid reflux).     [provider]  ondansetron (ZOFRAN ODT) 4 MG disintegrating tablet Allow 1-2 tablets to dissolve in your mouth every 8 hours as needed for nausea/vomiting 10/22/18  Hinda Kehr, MD  Propylene Glycol (SYSTANE BALANCE) 0.6 % SOLN Place 1 drop into both eyes daily as needed (for dry eyes).    [provider]    Allergies Penicillin g  Family History  Problem Relation Age of Onset  . Bone cancer Mother   . Stroke Father   . Breast cancer Neg Hx     Social History Social History   Tobacco Use  . Smoking status: Former Smoker    Packs/day: 1.00    Years: 15.00    Pack years: 15.00    Types: Cigarettes    Quit date: 11/07/1997    Years since quitting: 21.8  . Smokeless  tobacco: Never Used  Vaping Use  . Vaping Use: Never used  Substance Use Topics  . Alcohol use: No  . Drug use: No     Review of Systems  Constitutional: No fever/chills ENT: No upper respiratory complaints. Cardiovascular: No chest pain. Respiratory: No cough. No SOB. Gastrointestinal: No abdominal pain.  No nausea, no vomiting.  Musculoskeletal: Positive for back pain. Skin: Negative for rash, abrasions, lacerations, ecchymosis. Neurological: Negative for headaches, numbness or tingling   ____________________________________________   PHYSICAL EXAM:  VITAL SIGNS: ED Triage Vitals [08/24/19 0848]  Enc Vitals Group     BP 135/79     Pulse Rate 66     Resp 20     Temp 98.7 F (37.1 C)     Temp Source Oral     SpO2 94 %     Weight 155 lb (70.3 kg)     Height 4\' 11"  (1.499 m)     Head Circumference      Peak Flow      Pain Score 9     Pain Loc      Pain Edu?      Excl. in [place]?      Constitutional: Alert and oriented. Well appearing and in no acute distress. Eyes: Conjunctivae are normal. PERRL. EOMI. Head: Atraumatic. ENT:      Ears:      Nose: No congestion/rhinnorhea.      Mouth/Throat: Mucous membranes are moist.  Neck: No stridor.  No cervical spine tenderness to palpation. Cardiovascular: Normal rate, regular rhythm.  Good peripheral circulation. Respiratory: Normal respiratory effort without tachypnea or retractions. Lungs CTAB. Good air entry to the bases with no decreased or absent breath sounds. Gastrointestinal: Bowel sounds 4 quadrants. Soft and nontender to palpation. No guarding or rigidity. No palpable masses. No distention.  Musculoskeletal: Full range of motion to all extremities. No gross deformities appreciated. Tenderness to palpation to right inferior scapula. Pain elicited with resisted strength of right upper extremity. Normal gait. Neurologic:  Normal speech and language. No gross focal neurologic deficits are appreciated.  Skin:  Skin is  warm, dry and intact. No rash noted. Psychiatric: Mood and affect are normal. Speech and behavior are normal. Patient exhibits appropriate insight and judgement.   ____________________________________________   LABS (all labs ordered are listed, but only abnormal results are displayed)  Labs Reviewed - No data to display ____________________________________________  EKG  SB ____________________________________________  RADIOLOGY I, Laban Emperor, personally viewed and evaluated these images (plain radiographs) as part of my medical decision making, as well as reviewing the written report by the radiologist.  DG Chest 2 View  Result Date: 08/24/2019 CLINICAL DATA:  Pain EXAM: CHEST - 2 VIEW COMPARISON:  March 18, 2018 FINDINGS: The lungs are clear. The heart size and pulmonary vascularity are normal.  No adenopathy. There is mid to lower thoracic dextroscoliosis. No pneumothorax. IMPRESSION: Lungs clear.  Cardiac silhouette normal. Electronically Signed   By: Lowella Grip III M.D.   On: 08/24/2019 11:51    ____________________________________________    PROCEDURES  Procedure(s) performed:    Procedures    Medications  lidocaine (LIDODERM) 5 % 1 patch (1 patch Transdermal Patch Applied 08/24/19 1135)  traMADol (ULTRAM) tablet 50 mg (50 mg Oral Refused 08/24/19 1209)  methocarbamol (ROBAXIN) tablet 500 mg (500 mg Oral Given 08/24/19 1135)     ____________________________________________   INITIAL IMPRESSION / ASSESSMENT AND PLAN / ED COURSE  Pertinent labs & imaging results that were available during my care of the patient were reviewed by me and considered in my medical decision making (see chart for details).  Review of the [place] CSRS was performed in accordance of the [place] prior to dispensing any controlled drugs.   Patient's diagnosis is consistent with muscle strain. Vital signs and exam are reassuring. Xray negative for acute cardiopulmonary processes. SB on  EKG. No SOB, pleuritic pain, CP.  Pain is likely musculoskeletal, as it is reproducible with movement and palpation.  Symptoms significantly improved and almost resolved after robaxin and lidoderm.  Patient states that she is very happy with the medication and it worked great.  Patient will be discharged home with prescriptions for robaxin and lidoderm. Patient is to follow up with PCP as directed. Patient is given ED precautions to return to the ED for any worsening or new symptoms.  Dhriti E Cabeza was evaluated in Emergency Department on 08/24/2019 for the symptoms described in the history of present illness. She was evaluated in the context of the global COVID-19 pandemic, which necessitated consideration that the patient might be at risk for infection with the SARS-CoV-2 virus that causes COVID-19. Institutional protocols and algorithms that pertain to the evaluation of patients at risk for COVID-19 are in a state of rapid change based on information released by regulatory bodies including the CDC and federal and state organizations. These policies and algorithms were followed during the patient's care in the ED.   ____________________________________________  FINAL CLINICAL IMPRESSION(S) / ED DIAGNOSES  Final diagnoses:  Muscle strain      NEW MEDICATIONS STARTED DURING THIS VISIT:  ED Discharge Orders         Ordered    methocarbamol (ROBAXIN) 500 MG tablet  2 times daily PRN     Discontinue  Reprint     08/24/19 1235    lidocaine (LIDODERM) 5 %  Every 24 hours     Discontinue  Reprint     08/24/19 1235              This chart was dictated using voice recognition software/Dragon. Despite best efforts to proofread, errors can occur which can change the meaning. Any change was purely unintentional.    Laban Emperor, PA-C 08/24/19 1714    Harvest Dark, MD 08/29/19 [phone]

## 2019-08-24 NOTE — ED Triage Notes (Signed)
Pt presents to ED via POV with c/o back pain. Pt states hx of back problems, was told "years ago that they wouldn't operate on it". Pt states hx of arthritis of her spine and neck.   Pt ambulatory with steady gait, NAD noted at this time.

## 2019-08-24 NOTE — ED Notes (Signed)
Pt assessed by provider prior to nurse arrival to room

## 2019-08-26 ENCOUNTER — Emergency Department: Payer: Medicare Other

## 2019-08-26 ENCOUNTER — Emergency Department
Admission: EM | Admit: 2019-08-26 | Discharge: 2019-08-26 | Disposition: A | Discharge status: Home / Self Care | Payer: Medicare Other | Attending: Emergency Medicine | Admitting: Emergency Medicine

## 2019-08-26 ENCOUNTER — Other Ambulatory Visit: Payer: Self-pay

## 2019-08-26 DIAGNOSIS — M546 Pain in thoracic spine: Secondary | ICD-10-CM | POA: Diagnosis present

## 2019-08-26 DIAGNOSIS — Z79899 Other long term (current) drug therapy: Secondary | ICD-10-CM | POA: Diagnosis not present

## 2019-08-26 DIAGNOSIS — Z87891 Personal history of nicotine dependence: Secondary | ICD-10-CM | POA: Insufficient documentation

## 2019-08-26 LAB — COMPREHENSIVE METABOLIC PANEL
ALT: 23 U/L (ref 0–44)
AST: 20 U/L (ref 15–41)
Albumin: 4.2 g/dL (ref 3.5–5.0)
Alkaline Phosphatase: 56 U/L (ref 38–126)
Anion gap: 8 (ref 5–15)
BUN: 16 mg/dL (ref 8–23)
CO2: 29 mmol/L (ref 22–32)
Calcium: 9.5 mg/dL (ref 8.9–10.3)
Chloride: 105 mmol/L (ref 98–111)
Creatinine, Ser: 0.88 mg/dL (ref 0.44–1.00)
GFR calc Af Amer: >60 mL/min (ref >60)
GFR calc non Af Amer: >60 mL/min (ref >60)
Glucose, Bld: 101 mg/dL — ABNORMAL HIGH (ref 70–99)
Potassium: 3.9 mmol/L (ref 3.5–5.1)
Sodium: 142 mmol/L (ref 135–145)
Total Bilirubin: 0.4 mg/dL (ref 0.3–1.2)
Total Protein: 7.2 g/dL (ref 6.5–8.1)

## 2019-08-26 LAB — CBC
HCT: 37 % (ref 36.0–46.0)
Hemoglobin: 12.2 g/dL (ref 12.0–15.0)
MCH: 27.7 pg (ref 26.0–34.0)
MCHC: 33 g/dL (ref 30.0–36.0)
MCV: 84.1 fL (ref 80.0–100.0)
Platelets: 214 10*3/uL (ref 150–400)
RBC: 4.4 MIL/uL (ref 3.87–5.11)
RDW: 13.3 % (ref 11.5–15.5)
WBC: 5.5 10*3/uL (ref 4.0–10.5)
nRBC: 0 % (ref 0.0–0.2)

## 2019-08-26 LAB — URINALYSIS, COMPLETE (UACMP) WITH MICROSCOPIC
Bacteria, UA: NONE SEEN
Bilirubin Urine: NEGATIVE
Glucose, UA: NEGATIVE mg/dL
Hgb urine dipstick: NEGATIVE
Ketones, ur: NEGATIVE mg/dL
Leukocytes,Ua: NEGATIVE
Nitrite: NEGATIVE
Protein, ur: NEGATIVE mg/dL
Specific Gravity, Urine: 1.012 (ref 1.005–1.030)
pH: 5 (ref 5.0–8.0)

## 2019-08-26 MED ORDER — OXYCODONE-ACETAMINOPHEN 5-325 MG PO TABS: 1.0000 | ORAL_TABLET | Freq: Four times a day (QID) | ORAL | 0 refills | Status: AC | PRN

## 2019-08-26 MED ORDER — OXYCODONE-ACETAMINOPHEN 5-325 MG PO TABS
1.0000 | ORAL_TABLET | Freq: Once | ORAL | Status: AC
  Administered 2019-08-26: 1 via ORAL
  Filled 2019-08-26: qty 1

## 2019-08-26 NOTE — ED Provider Notes (Signed)
[facility] Emergency Department Provider Note ____________________________________________   First MD Initiated Contact with Patient 08/26/19 [phone]     (approximate)  I have reviewed the triage vital signs and the nursing notes.   HISTORY  Chief Complaint Back Pain    HPI Yolanda Barker is a 64 y.o. female with PMH as noted below who presents with right-sided upper back pain over the last 3 days, relatively acute onset, persistent course, not really changed with position.  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  She also has chronic neck and arm pain on the left side for which she is seeing neurosurgery, but states that this new back pain is different.  She denies any numbness or weakness in legs.  She has no chest pain or shortness of breath.  She was seen in the ED a few days ago but reports minimal relief from the medication she got.  Past Medical History:  Diagnosis Date  . Anemia   . Arthritis   . Diverticulitis   . GERD (gastroesophageal reflux disease)   . Vertigo     There are no problems to display for this patient.   Past Surgical History:  Procedure Laterality Date  . ABDOMINAL HYSTERECTOMY    . COLONOSCOPY WITH PROPOFOL N/A 03/15/2016   Procedure: COLONOSCOPY WITH PROPOFOL;  Surgeon: Lollie Sails, MD;  Location: [facility] ENDOSCOPY;  Service: Endoscopy;  Laterality: N/A;  . SHOULDER ARTHROSCOPY WITH OPEN ROTATOR CUFF REPAIR Right 11/14/2017   Procedure: SHOULDER ARTHROSCOPY WITH OPEN ROTATOR CUFF REPAIR;  Surgeon: Corky Mull, MD;  Location: [facility] ORS;  Service: Orthopedics;  Laterality: Right;  . SHOULDER CLOSED REDUCTION Right 03/13/2018   Procedure: CLOSED MANIPULATION SHOULDER;  Surgeon: Corky Mull, MD;  Location: [facility] ORS;  Service: Orthopedics;  Laterality: Right;  . STERIOD INJECTION Right 03/13/2018   Procedure: STEROID INJECTION RIGHT  SHOULDER;  Surgeon: Corky Mull, MD;  Location: [facility] ORS;  Service: Orthopedics;  Laterality: Right;  . TONSILLECTOMY    . WISDOM TOOTH EXTRACTION      Prior to Admission medications   Medication Sig Start Date End Date Taking? Authorizing Provider  acetaminophen (TYLENOL) 500 MG tablet Take 500 mg by mouth every 6 (six) hours as needed for moderate pain or headache.     [provider]  Cholecalciferol (VITAMIN D-1000 MAX ST) 1000 units tablet Take 1,000 Units by mouth daily.     [provider]  ferrous sulfate 325 (65 FE) MG tablet Take 325 mg by mouth daily.     [provider]  fexofenadine (ALLEGRA) 180 MG tablet Take 180 mg by mouth as needed.     [provider]  fluticasone (FLONASE ALLERGY RELIEF) 50 MCG/ACT nasal spray Place 2 sprays into both nostrils daily as needed for allergies.     [provider]  ibuprofen (ADVIL) 600 MG tablet Take 1 tablet by mouth three times daily with meals 10/22/18   Hinda Kehr, MD  lidocaine (LIDODERM) 5 % Place 1 patch onto the skin daily. Remove & Discard patch within 12 hours or as directed by MD 08/24/19   Laban Emperor, PA-C  meclizine (ANTIVERT) 25 MG tablet Take 25 mg by mouth 3 (three) times daily as needed for dizziness.     [provider]  methocarbamol (ROBAXIN) 500 MG tablet Take 1 tablet (500 mg total)  by mouth 2 (two) times daily as needed for muscle spasms. 08/24/19   Laban Emperor, PA-C  omeprazole (PRILOSEC) 20 MG capsule Take 20 mg by mouth 2 (two) times daily as needed (for acid reflux).     [provider]  ondansetron (ZOFRAN ODT) 4 MG disintegrating tablet Allow 1-2 tablets to dissolve in your mouth every 8 hours as needed for nausea/vomiting 10/22/18   Hinda Kehr, MD  oxyCODONE-acetaminophen (PERCOCET) 5-325 MG tablet Take 1 tablet by mouth every 6 (six) hours as needed for up to 5 days for severe pain. 08/26/19 08/31/19  Arta Silence, MD  Propylene Glycol  (SYSTANE BALANCE) 0.6 % SOLN Place 1 drop into both eyes daily as needed (for dry eyes).    [provider]    Allergies Penicillin g  Family History  Problem Relation Age of Onset  . Bone cancer Mother   . Stroke Father   . Breast cancer Neg Hx     Social History Social History   Tobacco Use  . Smoking status: Former Smoker    Packs/day: 1.00    Years: 15.00    Pack years: 15.00    Types: Cigarettes    Quit date: 11/07/1997    Years since quitting: 21.8  . Smokeless tobacco: Never Used  Vaping Use  . Vaping Use: Never used  Substance Use Topics  . Alcohol use: No  . Drug use: No    Review of Systems  Constitutional: No fever/chills. Eyes: No redness. ENT: No sore throat. Cardiovascular: Denies chest pain. Respiratory: Denies shortness of breath. Gastrointestinal: No vomiting or diarrhea.  Genitourinary: Negative for dysuria.  Musculoskeletal: Positive for back pain. Skin: Negative for rash. Neurological: Negative for focal weakness or numbness.   ____________________________________________   PHYSICAL EXAM:  VITAL SIGNS: ED Triage Vitals [08/26/19 0324]  Enc Vitals Group     BP (!) 164/84     Pulse Rate (!) 59     Resp 16     Temp 98 F (36.7 C)     Temp Source Oral     SpO2 100 %     Weight 150 lb (68 kg)     Height 4\' 11"  (1.499 m)     Head Circumference      Peak Flow      Pain Score 9     Pain Loc      Pain Edu?      Excl. in [place]?     Constitutional: Alert and oriented. Well appearing and in no acute distress. Eyes: Conjunctivae are normal.  Head: Atraumatic. Nose: No congestion/rhinnorhea. Mouth/Throat: Mucous membranes are moist.   Neck: Normal range of motion.  Cardiovascular: Normal rate, regular rhythm.  Good peripheral circulation. Respiratory: Normal respiratory effort.  No retractions.  Gastrointestinal: Soft and nontender. No distention.  Genitourinary: No CVA tenderness. Musculoskeletal: No lower extremity edema.   Extremities warm and well perfused.  Mild right thoracic paraspinal tenderness.  No midline spinal tenderness. Neurologic:  Normal speech and language.  5/5 motor strength and intact sensation of bilateral lower extremities.   Skin:  Skin is warm and dry. No rash noted. Psychiatric: Mood and affect are normal. Speech and behavior are normal.  ____________________________________________   LABS (all labs ordered are listed, but only abnormal results are displayed)  Labs Reviewed  COMPREHENSIVE METABOLIC PANEL - Abnormal; Notable for the following components:      Result Value   Glucose, Bld 101 (*)    All other components within normal  limits  URINALYSIS, COMPLETE (UACMP) WITH MICROSCOPIC - Abnormal; Notable for the following components:   Color, Urine YELLOW (*)    APPearance CLEAR (*)    All other components within normal limits  CBC   ____________________________________________  EKG   ____________________________________________  RADIOLOGY  XR thoracic spine: Osteoarthritic change with no acute findings  ____________________________________________   PROCEDURES  Procedure(s) performed: No  Procedures  Critical Care performed: No ____________________________________________   INITIAL IMPRESSION / ASSESSMENT AND PLAN / ED COURSE  Pertinent labs & imaging results that were available during my care of the patient were reviewed by me and considered in my medical decision making (see chart for details).  64 year old female with PMH as noted above presents with right upper back pain over the last several days after she feels like she may have strained it while lifting a patient at her job.  I reviewed the past medical records in [place].  The patient was seen in the ED for this on 6/12.  She had negative chest x-ray and EKG and was discharged with Robaxin and Lidoderm, but reports minimal relief from these.  She was also seen by Dr. Cari Caraway from neurosurgery on 6/10  due to left-sided neck and arm pain diagnosis cervical myelopathy with some areas of stenosis on a recent C-spine MRI.  However, the patient states that this thoracic back pain is different.  On exam, the patient is well-appearing.  Her vital signs are normal except for hypertension.  She does have mild right thoracic paraspinal tenderness.  Neurologic exam is normal.  Overall presentation is consistent with muscle strain/spasm versus less likely radiculopathy.  Given that the patient has no neurologic deficits and this does not appear to be related to her C-spine degenerative disease and stenosis, there is no indication for acute imaging of the spinal cord.  I will obtain a dedicated thoracic spine x-ray since she only had a chest x-ray last time.  There is no clinical evidence of pulmonary embolism or other vascular etiology given the normal vital signs and lack of chest pain or shortness of breath.  I have a low suspicion for urinary etiology but we will check a UA.  If the x-ray and UA are negative, anticipate discharge home with some additional medication for breakthrough pain and plan for continued conservative management.  ----------------------------------------- 9:52 AM on 08/26/2019 -----------------------------------------  Urinalysis is negative.  X-ray shows osteoarthritic chronic changes with no acute findings.  At this time, the patient is stable for discharge.  I counseled her on the results of the work-up.  I will give a small quantity of Percocet for breakthrough pain since it worked well here in the ED.  However, I counseled the patient that this is not a Christiano-term treatment.  She will follow up with her PMD and with neurosurgery as scheduled.  Return precautions given, and she expresses understanding.  ____________________________________________   FINAL CLINICAL IMPRESSION(S) / ED DIAGNOSES  Final diagnoses:  Acute right-sided thoracic back pain      NEW MEDICATIONS  STARTED DURING THIS VISIT:  New Prescriptions   OXYCODONE-ACETAMINOPHEN (PERCOCET) 5-325 MG TABLET    Take 1 tablet by mouth every 6 (six) hours as needed for up to 5 days for severe pain.     Note:  This document was prepared using Dragon voice recognition software and may include unintentional dictation errors.    Arta Silence, MD 08/26/19 [phone]

## 2019-08-26 NOTE — ED Notes (Addendum)
Xray completed, reports pain returning. Urine and urine culture specimens obtained and sent.

## 2019-08-26 NOTE — ED Notes (Signed)
Patient reports lower non traumatic back pain. Back pain worse Friday,

## 2019-08-26 NOTE — Discharge Instructions (Addendum)
Return to the ER for new, worsening, or persistent severe back pain, any weakness or numbness, difficulty urinating, chest pain, shortness of breath, or any other new or worsening symptoms that concern you.  You may take the medications that you are already on, and add the Percocet prescribed today only as needed for more severe or breakthrough pain.  Follow-up with your regular doctor and with the neurosurgeon as scheduled.

## 2019-08-26 NOTE — ED Notes (Signed)
Spoke with dr. Owens Shark regarding pt's symptoms and chief complaint. Dr. Owens Shark to place orders.

## 2019-08-26 NOTE — ED Triage Notes (Signed)
Pt with upper back pain since Friday. Pt states pain increases despite treatment with pain medication. Pt denies shob, nausea, dizziness.

## 2019-10-14 ENCOUNTER — Emergency Department: Payer: Medicare Other

## 2019-10-14 ENCOUNTER — Other Ambulatory Visit: Payer: Self-pay

## 2019-10-14 DIAGNOSIS — R600 Localized edema: Secondary | ICD-10-CM | POA: Insufficient documentation

## 2019-10-14 DIAGNOSIS — L299 Pruritus, unspecified: Secondary | ICD-10-CM | POA: Insufficient documentation

## 2019-10-14 DIAGNOSIS — Z87891 Personal history of nicotine dependence: Secondary | ICD-10-CM | POA: Diagnosis not present

## 2019-10-14 DIAGNOSIS — L139 Bullous disorder, unspecified: Secondary | ICD-10-CM | POA: Insufficient documentation

## 2019-10-14 DIAGNOSIS — R2242 Localized swelling, mass and lump, left lower limb: Secondary | ICD-10-CM | POA: Diagnosis present

## 2019-10-14 DIAGNOSIS — Z880 Allergy status to penicillin: Secondary | ICD-10-CM | POA: Insufficient documentation

## 2019-10-14 LAB — CBC WITH DIFFERENTIAL/PLATELET
Abs Immature Granulocytes: 0.01 10*3/uL (ref 0.00–0.07)
Basophils Absolute: 0 10*3/uL (ref 0.0–0.1)
Basophils Relative: 0 %
Eosinophils Absolute: 0.2 10*3/uL (ref 0.0–0.5)
Eosinophils Relative: 3 %
HCT: 36.7 % (ref 36.0–46.0)
Hemoglobin: 12 g/dL (ref 12.0–15.0)
Immature Granulocytes: 0 %
Lymphocytes Relative: 43 %
Lymphs Abs: 2.5 10*3/uL (ref 0.7–4.0)
MCH: 27.1 pg (ref 26.0–34.0)
MCHC: 32.7 g/dL (ref 30.0–36.0)
MCV: 83 fL (ref 80.0–100.0)
Monocytes Absolute: 0.6 10*3/uL (ref 0.1–1.0)
Monocytes Relative: 10 %
Neutro Abs: 2.6 10*3/uL (ref 1.7–7.7)
Neutrophils Relative %: 44 %
Platelets: 245 10*3/uL (ref 150–400)
RBC: 4.42 MIL/uL (ref 3.87–5.11)
RDW: 13.2 % (ref 11.5–15.5)
WBC: 5.8 10*3/uL (ref 4.0–10.5)
nRBC: 0 % (ref 0.0–0.2)

## 2019-10-14 LAB — COMPREHENSIVE METABOLIC PANEL
ALT: 28 U/L (ref 0–44)
AST: 26 U/L (ref 15–41)
Albumin: 4.3 g/dL (ref 3.5–5.0)
Alkaline Phosphatase: 59 U/L (ref 38–126)
Anion gap: 10 (ref 5–15)
BUN: 13 mg/dL (ref 8–23)
CO2: 26 mmol/L (ref 22–32)
Calcium: 9.9 mg/dL (ref 8.9–10.3)
Chloride: 106 mmol/L (ref 98–111)
Creatinine, Ser: 0.92 mg/dL (ref 0.44–1.00)
GFR calc Af Amer: >60 mL/min (ref >60)
GFR calc non Af Amer: >60 mL/min (ref >60)
Glucose, Bld: 111 mg/dL — ABNORMAL HIGH (ref 70–99)
Potassium: 3.5 mmol/L (ref 3.5–5.1)
Sodium: 142 mmol/L (ref 135–145)
Total Bilirubin: 0.4 mg/dL (ref 0.3–1.2)
Total Protein: 7.3 g/dL (ref 6.5–8.1)

## 2019-10-14 LAB — LACTIC ACID, PLASMA: Lactic Acid, Venous: 0.7 mmol/L (ref 0.5–1.9)

## 2019-10-14 NOTE — ED Triage Notes (Signed)
PT reports that a week ago she began to experience swelling and itching on the left foot near the ball of the foot. Pt states that she began to use a cream that she is not sure of the name and has had for some time and today she has since experienced redness, swelling across the foot and two blisters have appeared, one on the foot and the other on the 2nd toe of the left foot. Pt reports itching and has some pain with ambulation.

## 2019-10-15 ENCOUNTER — Emergency Department
Admission: EM | Admit: 2019-10-15 | Discharge: 2019-10-15 | Disposition: A | Discharge status: Home / Self Care | Payer: Medicare Other | Attending: Emergency Medicine | Admitting: Emergency Medicine

## 2019-10-15 DIAGNOSIS — L299 Pruritus, unspecified: Secondary | ICD-10-CM

## 2019-10-15 DIAGNOSIS — R238 Other skin changes: Secondary | ICD-10-CM

## 2019-10-15 DIAGNOSIS — R609 Edema, unspecified: Secondary | ICD-10-CM

## 2019-10-15 MED ORDER — LORATADINE 10 MG PO TABS
10.0000 mg | ORAL_TABLET | ORAL | Status: AC
  Administered 2019-10-15: 10 mg via ORAL
  Filled 2019-10-15: qty 1

## 2019-10-15 MED ORDER — ACETAMINOPHEN 500 MG PO TABS
1000.0000 mg | ORAL_TABLET | Freq: Once | ORAL | Status: AC
  Administered 2019-10-15: 1000 mg via ORAL
  Filled 2019-10-15: qty 2

## 2019-10-15 NOTE — ED Provider Notes (Signed)
[facility] Emergency Department Provider Note  ____________________________________________   First MD Initiated Contact with Patient 10/15/19 0144     (approximate)  I have reviewed the triage vital signs and the nursing notes.   HISTORY  Chief Complaint Foot Swelling    HPI Yolanda Barker is a 64 y.o. female with medical history as listed below and no history of diabetes.  Yolanda Barker presents for evaluation of itching to the left foot for about a week and a recent development earlier today of 2 blisters on the top of the foot.  Yolanda Barker has no new shoes and no contacts with any allergen or other substance of which Yolanda Barker is aware.  No injury.  Yolanda Barker said that Yolanda Barker never goes barefoot, certainly not outside.  Yolanda Barker is having some swelling in the left foot and it is painful when Yolanda Barker bears weight.  The blisters have developed just over the course of the day for no particular reason.  They are itching as well as the inner (medial) part of the foot, the bottom of it, and between the toes, but Yolanda Barker has had no skin breakdown or interruption.  Yolanda Barker denies fever/chills, sore throat, chest pain, shortness of breath, nausea, vomiting, and abdominal pain.  Nothing in particular makes the symptoms better and it was relatively gradual in onset over the course of the last week.         Past Medical History:  Diagnosis Date  . Anemia   . Arthritis   . Diverticulitis   . GERD (gastroesophageal reflux disease)   . Vertigo     There are no problems to display for this patient.   Past Surgical History:  Procedure Laterality Date  . ABDOMINAL HYSTERECTOMY    . COLONOSCOPY WITH PROPOFOL N/A 03/15/2016   Procedure: COLONOSCOPY WITH PROPOFOL;  Surgeon: Lollie Sails, MD;  Location: [facility] ENDOSCOPY;  Service: Endoscopy;  Laterality: N/A;  . SHOULDER ARTHROSCOPY WITH OPEN ROTATOR CUFF REPAIR Right 11/14/2017   Procedure: SHOULDER ARTHROSCOPY WITH OPEN ROTATOR CUFF REPAIR;  Surgeon: Corky Mull, MD;  Location: [facility] ORS;  Service: Orthopedics;  Laterality: Right;  . SHOULDER CLOSED REDUCTION Right 03/13/2018   Procedure: CLOSED MANIPULATION SHOULDER;  Surgeon: Corky Mull, MD;  Location: [facility] ORS;  Service: Orthopedics;  Laterality: Right;  . STERIOD INJECTION Right 03/13/2018   Procedure: STEROID INJECTION RIGHT SHOULDER;  Surgeon: Corky Mull, MD;  Location: [facility] ORS;  Service: Orthopedics;  Laterality: Right;  . TONSILLECTOMY    . WISDOM TOOTH EXTRACTION      Prior to Admission medications   Medication Sig Start Date End Date Taking? Authorizing Provider  acetaminophen (TYLENOL) 500 MG tablet Take 500 mg by mouth every 6 (six) hours as needed for moderate pain or headache.     [provider]  Cholecalciferol (VITAMIN D-1000 MAX ST) 1000 units tablet Take 1,000 Units by mouth daily.     [provider]  ferrous sulfate 325 (65 FE) MG tablet Take 325 mg by mouth daily.     [provider]  fexofenadine (ALLEGRA) 180 MG tablet Take 180 mg by mouth as needed.     [provider]  fluticasone (FLONASE ALLERGY RELIEF) 50 MCG/ACT nasal spray Place 2 sprays into both nostrils daily as needed for allergies.     [provider]  ibuprofen (ADVIL) 600 MG tablet Take 1 tablet by mouth three times daily with meals 10/22/18   Hinda Kehr, MD  lidocaine (LIDODERM) 5 %  Place 1 patch onto the skin daily. Remove & Discard patch within 12 hours or as directed by MD 08/24/19   Laban Emperor, PA-C  meclizine (ANTIVERT) 25 MG tablet Take 25 mg by mouth 3 (three) times daily as needed for dizziness.     [provider]  methocarbamol (ROBAXIN) 500 MG tablet Take 1 tablet (500 mg total) by mouth 2 (two) times daily as needed for muscle spasms. 08/24/19   Laban Emperor, PA-C  omeprazole (PRILOSEC) 20 MG capsule Take 20 mg by mouth 2 (two) times daily as needed (for acid reflux).     [provider]  ondansetron (ZOFRAN ODT) 4 MG  disintegrating tablet Allow 1-2 tablets to dissolve in your mouth every 8 hours as needed for nausea/vomiting 10/22/18   Hinda Kehr, MD  Propylene Glycol (SYSTANE BALANCE) 0.6 % SOLN Place 1 drop into both eyes daily as needed (for dry eyes).    [provider]    Allergies Penicillin g  Family History  Problem Relation Age of Onset  . Bone cancer Mother   . Stroke Father   . Breast cancer Neg Hx     Social History Social History   Tobacco Use  . Smoking status: Former Smoker    Packs/day: 1.00    Years: 15.00    Pack years: 15.00    Types: Cigarettes    Quit date: 11/07/1997    Years since quitting: 21.9  . Smokeless tobacco: Never Used  Vaping Use  . Vaping Use: Never used  Substance Use Topics  . Alcohol use: No  . Drug use: No    Review of Systems Constitutional: No fever/chills Eyes: No visual changes. ENT: No sore throat. Cardiovascular: Denies chest pain. Respiratory: Denies shortness of breath. Gastrointestinal: No abdominal pain.  No nausea, no vomiting.  No diarrhea.  No constipation. Genitourinary: Negative for dysuria. Musculoskeletal: Swelling in left foot.  Negative for neck pain.  Negative for back pain. Integumentary: Itching of the left foot with 2 blisters that have developed on the top of it. Neurological: Negative for headaches, focal weakness or numbness.   ____________________________________________   PHYSICAL EXAM:  VITAL SIGNS: ED Triage Vitals  Enc Vitals Group     BP 10/14/19 2057 (!) 166/91     Pulse Rate 10/14/19 2057 71     Resp 10/14/19 2057 18     Temp 10/14/19 2057 99.8 F (37.7 C)     Temp Source 10/14/19 2057 Oral     SpO2 10/14/19 2057 99 %     Weight 10/14/19 2059 68 kg (150 lb)     Height 10/14/19 2059 1.499 m (4\' 11" )     Head Circumference --      Peak Flow --      Pain Score 10/14/19 2059 3     Pain Loc --      Pain Edu? --      Excl. in [place]? --     Constitutional: Alert and oriented.  Eyes:  Conjunctivae are normal.  Head: Atraumatic. Nose: No congestion/rhinnorhea. Mouth/Throat: Patient is wearing a mask. Neck: No stridor.  No meningeal signs.   Cardiovascular: Normal rate, regular rhythm. Good peripheral circulation. Grossly normal heart sounds. Respiratory: Normal respiratory effort.  No retractions. Gastrointestinal: Soft and nontender. No distention.  Musculoskeletal: There is some mild peripheral edema in the left foot but no other gross abnormalities are noted.  See skin section for additional details. Neurologic:  Normal speech and language. No gross focal neurologic  deficits are appreciated.  Skin:  Skin is warm, dry and intact.  No erythema or skin breakdown of the left foot.  Yolanda Barker has 1 small bulla that are less than 1 cm in diameter on the dorsum of the foot and another on the top of the second toe with no surrounding erythema, induration, nor fluctuance.  There is no skin breakdown suggestive of fungal infection between her toes.  No pressure ulcers or wounds on the plantar aspect of the foot. Psychiatric: Mood and affect are normal. Speech and behavior are normal.  ____________________________________________   LABS (all labs ordered are listed, but only abnormal results are displayed)  Labs Reviewed  COMPREHENSIVE METABOLIC PANEL - Abnormal; Notable for the following components:      Result Value   Glucose, Bld 111 (*)    All other components within normal limits  CBC WITH DIFFERENTIAL/PLATELET  LACTIC ACID, PLASMA  LACTIC ACID, PLASMA   ____________________________________________  EKG  No indication for emergent EKG ____________________________________________  RADIOLOGY Ursula Alert, personally viewed and evaluated these images (plain radiographs) as part of my medical decision making, as well as reviewing the written report by the radiologist.  ED MD interpretation: No evidence of DVT on ultrasound  Official radiology report(s): US Venous Img  Lower Unilateral Left  Result Date: 10/14/2019 CLINICAL DATA:  Left foot swelling for 1 week EXAM: LEFT LOWER EXTREMITY VENOUS DOPPLER ULTRASOUND TECHNIQUE: Gray-scale sonography with compression, as well as color and duplex ultrasound, were performed to evaluate the deep venous system(s) from the level of the common femoral vein through the popliteal and proximal calf veins. COMPARISON:  None. FINDINGS: VENOUS Normal compressibility of the common femoral, superficial femoral, and popliteal veins, as well as the visualized calf veins. Visualized portions of profunda femoral vein and great saphenous vein unremarkable. No filling defects to suggest DVT on grayscale or color Doppler imaging. Doppler waveforms show normal direction of venous flow, normal respiratory plasticity and response to augmentation. Limited views of the contralateral common femoral vein are unremarkable. OTHER None. Limitations: none IMPRESSION: No femoropopliteal DVT nor evidence of DVT within the visualized calf veins. If clinical symptoms are inconsistent or if there are persistent or worsening symptoms, further imaging (possibly involving the iliac veins) may be warranted. Electronically Signed   By: Lovena Le M.D.   On: 10/14/2019 21:51    ____________________________________________   PROCEDURES   Procedure(s) performed (including Critical Care):  Procedures   ____________________________________________   INITIAL IMPRESSION / MDM / [place] / ED COURSE  As part of my medical decision making, I reviewed the following data within the [place] notes reviewed and incorporated, Labs reviewed , Old chart reviewed and Notes from prior ED visits   Differential diagnosis includes, but is not limited to, tinea pedis, cellulitis, friction injury or pressure wound, burn, contact dermatitis, atopic dermatitis.  There does not appear to be any sign of infection and the 2 bullae are in  somewhat odd places to be the result of pressure.  There is no surrounding inflammation or erythema.  Yolanda Barker has no skin breakdown to suggest a fungal infection.  Lab work is reassuring and within normal limits.  No leukocytosis, no fever, no tachycardia.  Ultrasound is normal with no sign of DVT.  I advised the patient to try daily cetirizine and to try over-the-counter antifungal powder on her feet.  I strongly encouraged her to follow-up with the next available opportunity with podiatry for further recommendations and  I suggest that Yolanda Barker keep her foot elevated when possible.  I gave my usual and customary return precautions and Yolanda Barker understands and agrees with the plan.  I gave her a loratadine 10 mg by mouth and Tylenol 1000 mg by mouth in the ED prior to discharge.           ____________________________________________  FINAL CLINICAL IMPRESSION(S) / ED DIAGNOSES  Final diagnoses:  Localized pruritus  Bullae  Peripheral edema     MEDICATIONS GIVEN DURING THIS VISIT:  Medications  loratadine (CLARITIN) tablet 10 mg (10 mg Oral Given 10/15/19 0208)  acetaminophen (TYLENOL) tablet 1,000 mg (1,000 mg Oral Given 10/15/19 0208)     ED Discharge Orders    None      *Please note:  Yolanda Barker was evaluated in Emergency Department on 10/15/2019 for the symptoms described in the history of present illness. Yolanda Barker was evaluated in the context of the global COVID-19 pandemic, which necessitated consideration that the patient might be at risk for infection with the SARS-CoV-2 virus that causes COVID-19. Institutional protocols and algorithms that pertain to the evaluation of patients at risk for COVID-19 are in a state of rapid change based on information released by regulatory bodies including the CDC and federal and state organizations. These policies and algorithms were followed during the patient's care in the ED.  Some ED evaluations and interventions may be delayed as a result of limited  staffing during and after the pandemic.*  Note:  This document was prepared using Dragon voice recognition software and may include unintentional dictation errors.   Hinda Kehr, MD 10/15/19 [phone]

## 2019-10-15 NOTE — Discharge Instructions (Addendum)
As we discussed, your lab work was reassuring today.  He also had an ultrasound performed which showed no sign of blood clot in your left leg.  The itching on your foot could be caused by a mild fungal infection and I recommend you try using an over-the-counter antifungal powder on your foot and inside your sock.  You may also want to take a daily Zyrtec (cetirizine) which is available over-the-counter and may help with the itching .  Leave the small bullae (blisters) alone, and if they pop, let them drain but do not try to disrupt the skin any further.  Use over-the-counter ibuprofen and/or Tylenol as needed for pain and keep your foot elevated when possible.  Call the office of Dr. Luana Shu to schedule a follow-up appointment with him or one of the other podiatrist at the next available opportunity, preferably either Tuesday or Wednesday.    Return to the emergency department if you develop new or worsening symptoms that concern you.

## 2019-10-15 NOTE — ED Notes (Signed)
Pt states that last week her foot started burning and itching at her big toe but then today it got worse and started swelling and blisters started developing. Patient denies fevers/nvd or other symptoms. Foot is not red/warm but is swollen.

## 2019-10-20 ENCOUNTER — Other Ambulatory Visit: Payer: Self-pay

## 2019-10-20 DIAGNOSIS — I10 Essential (primary) hypertension: Secondary | ICD-10-CM | POA: Insufficient documentation

## 2019-10-20 DIAGNOSIS — R109 Unspecified abdominal pain: Secondary | ICD-10-CM | POA: Insufficient documentation

## 2019-10-20 DIAGNOSIS — Z87891 Personal history of nicotine dependence: Secondary | ICD-10-CM | POA: Diagnosis not present

## 2019-10-20 LAB — BASIC METABOLIC PANEL
Anion gap: 9 (ref 5–15)
BUN: 20 mg/dL (ref 8–23)
CO2: 28 mmol/L (ref 22–32)
Calcium: 9.2 mg/dL (ref 8.9–10.3)
Chloride: 105 mmol/L (ref 98–111)
Creatinine, Ser: 1.02 mg/dL — ABNORMAL HIGH (ref 0.44–1.00)
GFR calc Af Amer: >60 mL/min (ref >60)
GFR calc non Af Amer: 57 mL/min — ABNORMAL LOW (ref >60)
Glucose, Bld: 112 mg/dL — ABNORMAL HIGH (ref 70–99)
Potassium: 3.9 mmol/L (ref 3.5–5.1)
Sodium: 142 mmol/L (ref 135–145)

## 2019-10-20 LAB — URINALYSIS, COMPLETE (UACMP) WITH MICROSCOPIC
Bilirubin Urine: NEGATIVE
Glucose, UA: NEGATIVE mg/dL
Ketones, ur: NEGATIVE mg/dL
Leukocytes,Ua: NEGATIVE
Nitrite: NEGATIVE
Protein, ur: NEGATIVE mg/dL
Specific Gravity, Urine: 1.005 (ref 1.005–1.030)
pH: 6 (ref 5.0–8.0)

## 2019-10-20 LAB — CBC
HCT: 37.5 % (ref 36.0–46.0)
Hemoglobin: 12.3 g/dL (ref 12.0–15.0)
MCH: 27.5 pg (ref 26.0–34.0)
MCHC: 32.8 g/dL (ref 30.0–36.0)
MCV: 83.9 fL (ref 80.0–100.0)
Platelets: 257 10*3/uL (ref 150–400)
RBC: 4.47 MIL/uL (ref 3.87–5.11)
RDW: 13.3 % (ref 11.5–15.5)
WBC: 9.3 10*3/uL (ref 4.0–10.5)
nRBC: 0 % (ref 0.0–0.2)

## 2019-10-20 NOTE — ED Triage Notes (Signed)
Pt arrived via POV with c/o left flank pain and urinary urgency that started today. Pt states she feels like she has a UTI and feels like she has pee.

## 2019-10-20 NOTE — ED Notes (Signed)
No answer when called for triage 

## 2019-10-21 ENCOUNTER — Emergency Department
Admission: EM | Admit: 2019-10-21 | Discharge: 2019-10-21 | Disposition: A | Discharge status: Home / Self Care | Payer: Medicare Other | Attending: Emergency Medicine | Admitting: Emergency Medicine

## 2019-10-21 DIAGNOSIS — R109 Unspecified abdominal pain: Secondary | ICD-10-CM

## 2019-10-21 DIAGNOSIS — I10 Essential (primary) hypertension: Secondary | ICD-10-CM

## 2019-10-21 MED ORDER — ACETAMINOPHEN 500 MG PO TABS
1000.0000 mg | ORAL_TABLET | Freq: Once | ORAL | Status: AC
  Administered 2019-10-21: 1000 mg via ORAL
  Filled 2019-10-21: qty 2

## 2019-10-21 NOTE — Discharge Instructions (Signed)
As we discussed, you likely passed a small kidney stone due to your improving pain.  Please take Tylenol and ibuprofen/Advil for your pain.  It is safe to take them together, or to alternate them every few hours.  Take up to 1000mg  of Tylenol at a time, up to 4 times per day.  Do not take more than 4000 mg of Tylenol in 24 hours.  For ibuprofen, take 400-600 mg, 4-5 times per day.  Please follow-up with your primary care physician in the next week to discuss this episode, and to ensure that you are continuing to get better.  If you develop any fevers with your symptoms, or worsening symptoms despite the above medications, please return to the ED.

## 2019-10-21 NOTE — ED Notes (Signed)
Pt reports improvement of pain and denies urinary symptoms at this time. Pt able to to tolerate PO food and liquid at this time. MD Tamala Julian aware.

## 2019-10-21 NOTE — ED Notes (Signed)
E-signature not working at this time. Pt verbalized understanding of D/C instructions, prescriptions and follow up care with no further questions at this time. Pt in NAD and ambulatory at time of D/C.  

## 2019-10-21 NOTE — ED Notes (Signed)
Pt given PO food and liquid to trial.

## 2019-10-21 NOTE — ED Provider Notes (Signed)
[facility] Emergency Department Provider Note ____________________________________________   First MD Initiated Contact with Patient 10/21/19 [phone]     (approximate)  I have reviewed the triage vital signs and the nursing notes.  HISTORY  Chief Complaint Flank Pain and Urinary Frequency   HPI 50 E Tipping is a 64 y.o. female who presents to the ED for evaluation of flank pain and urinary frequency.  Chart review indicates no significant urologic history.  History of diverticulitis. Review of recent ED notes from the past few months indicates patient is frequently hypertensive, but not prescribed antihypertensive medications.  Patient reports developing sudden onset left flank pain about 24 hours ago they radiated to her left groin.  She reports an aching or burning sensation that was constant for a matter of hours before improving.  She reported associated urinary frequency and sensation of incomplete emptying without dysuria or hematuria.  Denies vaginal discharge, diarrhea, constipation, nausea, vomiting, fever or dysuria.  Patient reports sitting in our waiting room for a matter of 10 hours due to hospital-wide staff shortages and [facility] waiting times.  In that time, patient reports feeling better.  She reports "more full" urinations without dysuria, hematuria, frequency or incontinence.  She reports the pain in her flank is subsided and she feels better, requesting food/drink.  We discussed utility of CT imaging in the setting of nonacute blood work and urinalysis, and she reports that due to [facility] wait times and feeling better she is inclined to attempt p.o. challenge with possible outpatient management PCP follow-up.  Past Medical History:  Diagnosis Date  . Anemia   . Arthritis   . Diverticulitis   . GERD (gastroesophageal reflux disease)   . Vertigo     There are no problems to display for this patient.   Past Surgical History:  Procedure  Laterality Date  . ABDOMINAL HYSTERECTOMY    . COLONOSCOPY WITH PROPOFOL N/A 03/15/2016   Procedure: COLONOSCOPY WITH PROPOFOL;  Surgeon: Lollie Sails, MD;  Location: [facility] ENDOSCOPY;  Service: Endoscopy;  Laterality: N/A;  . SHOULDER ARTHROSCOPY WITH OPEN ROTATOR CUFF REPAIR Right 11/14/2017   Procedure: SHOULDER ARTHROSCOPY WITH OPEN ROTATOR CUFF REPAIR;  Surgeon: Corky Mull, MD;  Location: [facility] ORS;  Service: Orthopedics;  Laterality: Right;  . SHOULDER CLOSED REDUCTION Right 03/13/2018   Procedure: CLOSED MANIPULATION SHOULDER;  Surgeon: Corky Mull, MD;  Location: [facility] ORS;  Service: Orthopedics;  Laterality: Right;  . STERIOD INJECTION Right 03/13/2018   Procedure: STEROID INJECTION RIGHT SHOULDER;  Surgeon: Corky Mull, MD;  Location: [facility] ORS;  Service: Orthopedics;  Laterality: Right;  . TONSILLECTOMY    . WISDOM TOOTH EXTRACTION      Prior to Admission medications   Medication Sig Start Date End Date Taking? Authorizing Provider  acetaminophen (TYLENOL) 500 MG tablet Take 500 mg by mouth every 6 (six) hours as needed for moderate pain or headache.     [provider]  Cholecalciferol (VITAMIN D-1000 MAX ST) 1000 units tablet Take 1,000 Units by mouth daily.     [provider]  ferrous sulfate 325 (65 FE) MG tablet Take 325 mg by mouth daily.     [provider]  fexofenadine (ALLEGRA) 180 MG tablet Take 180 mg by mouth as needed.     [provider]  fluticasone (FLONASE ALLERGY RELIEF) 50 MCG/ACT nasal spray Place 2 sprays into both nostrils daily as needed for allergies.     [provider]  ibuprofen (ADVIL)  600 MG tablet Take 1 tablet by mouth three times daily with meals 10/22/18   Hinda Kehr, MD  lidocaine (LIDODERM) 5 % Place 1 patch onto the skin daily. Remove & Discard patch within 12 hours or as directed by MD 08/24/19   Laban Emperor, PA-C  meclizine (ANTIVERT) 25 MG tablet Take 25 mg by mouth 3 (three) times daily as  needed for dizziness.     [provider]  methocarbamol (ROBAXIN) 500 MG tablet Take 1 tablet (500 mg total) by mouth 2 (two) times daily as needed for muscle spasms. 08/24/19   Laban Emperor, PA-C  omeprazole (PRILOSEC) 20 MG capsule Take 20 mg by mouth 2 (two) times daily as needed (for acid reflux).     [provider]  ondansetron (ZOFRAN ODT) 4 MG disintegrating tablet Allow 1-2 tablets to dissolve in your mouth every 8 hours as needed for nausea/vomiting 10/22/18   Hinda Kehr, MD  Propylene Glycol (SYSTANE BALANCE) 0.6 % SOLN Place 1 drop into both eyes daily as needed (for dry eyes).    [provider]    Allergies Penicillin g  Family History  Problem Relation Age of Onset  . Bone cancer Mother   . Stroke Father   . Breast cancer Neg Hx     Social History Social History   Tobacco Use  . Smoking status: Former Smoker    Packs/day: 1.00    Years: 15.00    Pack years: 15.00    Types: Cigarettes    Quit date: 11/07/1997    Years since quitting: 21.9  . Smokeless tobacco: Never Used  Vaping Use  . Vaping Use: Never used  Substance Use Topics  . Alcohol use: No  . Drug use: No    Review of Systems  Constitutional: No fever/chills Eyes: No visual changes. ENT: No sore throat. Cardiovascular: Denies chest pain. Respiratory: Denies shortness of breath. Gastrointestinal: Positive for flank pain, but no abdominal pain.  No nausea, no vomiting.  No diarrhea.  No constipation. Genitourinary: Negative for dysuria.  Positive for urinary frequency. Musculoskeletal: Negative for back pain. Skin: Negative for rash. Neurological: Negative for headaches, focal weakness or numbness.   ____________________________________________   PHYSICAL EXAM:  VITAL SIGNS: Vitals:   10/21/19 0131 10/21/19 0913  BP: (!) 182/90 (!) 165/70  Pulse: (!) 56 60  Resp: 18 17  Temp: 98.2 F (36.8 C) 98.2 F (36.8 C)  SpO2: 100% 100%      Constitutional:  Alert and oriented. Well appearing and in no acute distress.  Sitting up in the bedside chair, conversational in full sentences and well-appearing. Eyes: Conjunctivae are normal. PERRL. EOMI. Head: Atraumatic. Nose: No congestion/rhinnorhea. Mouth/Throat: Mucous membranes are moist.  Oropharynx non-erythematous. Neck: No stridor. No cervical spine tenderness to palpation. Cardiovascular: Normal rate, regular rhythm. Grossly normal heart sounds.  Good peripheral circulation. Respiratory: Normal respiratory effort.  No retractions. Lungs CTAB. Gastrointestinal: Soft , nondistended, nontender to palpation. No abdominal bruits.  Minimal left CVA tenderness, none on the right.  Anterior abdomen is benign.  No overlying skin changes or signs of shingles. Musculoskeletal: No lower extremity tenderness nor edema.  No joint effusions. No signs of acute trauma. Neurologic:  Normal speech and language. No gross focal neurologic deficits are appreciated. No gait instability noted. Skin:  Skin is warm, dry and intact. No rash noted. Psychiatric: Mood and affect are normal. Speech and behavior are normal.  ____________________________________________   LABS (all labs ordered are listed, but only abnormal  results are displayed)  Labs Reviewed  URINALYSIS, COMPLETE (UACMP) WITH MICROSCOPIC - Abnormal; Notable for the following components:      Result Value   Color, Urine STRAW (*)    APPearance CLEAR (*)    Hgb urine dipstick LARGE (*)    Bacteria, UA RARE (*)    All other components within normal limits  BASIC METABOLIC PANEL - Abnormal; Notable for the following components:   Glucose, Bld 112 (*)    Creatinine, Ser 1.02 (*)    GFR calc non Af Amer 57 (*)    All other components within normal limits  CBC   ____________________________________________  PROCEDURES and INTERVENTIONS  Procedure(s) performed (including Critical Care):  Procedures  Medications  acetaminophen (TYLENOL) tablet  1,000 mg (1,000 mg Oral Given 10/21/19 0900)    ____________________________________________   INITIAL IMPRESSION / ASSESSMENT AND PLAN / ED COURSE  64 year old woman presenting with left flank pain without evidence of acute pathology, possibly passing a small renal stone, and amenable to outpatient management.  Mild and persistent hypertension, upon chart review is likely at patient's baseline, and vitals otherwise normal on room air.  Exam with some minimal left CVA tenderness, but no frontal abdominal tenderness or signs of peritoneal abdomen.  She really looks quite well and has minimal tenderness to her left CVA.  No overlying rash or signs of shingles.  Blood work without acute pathology.  UA without infectious features, and no RBCs, though does demonstrate large hemoglobin.  She possibly passed a ureterolith as the source of her symptoms.  We discussed CT imaging versus conservative measures, and she reports having waited in the waiting room all morning that she would prefer to go home and follow-up with her PCP, which I think is reasonable.  No acute indications for CT imaging here.  Patient reports feeling well, tolerating p.o. intake and Tylenol ministration with improving symptoms upon discharge.  Clinical Course as of Oct 21 1607  Mon Oct 21, 2019  0935 Reassessed.  Patient reports continued to improve.  She has tolerated p.o. intake and Tylenol administration.  She reports feeling well and wished to go home.  We discussed outpatient management, following up with PCP, and return precautions.  She expresses understanding and agreement.   [DS]    Clinical Course User Index [DS] Vladimir Crofts, MD     ____________________________________________   FINAL CLINICAL IMPRESSION(S) / ED DIAGNOSES  Final diagnoses:  Flank pain  Left flank pain  Essential hypertension     ED Discharge Orders    None       Izzak Fries Tamala Julian   Note:  This document was prepared using Dragon voice  recognition software and may include unintentional dictation errors.   Vladimir Crofts, MD 10/21/19 [phone]

## 2019-11-13 ENCOUNTER — Other Ambulatory Visit: Payer: Self-pay

## 2019-11-13 ENCOUNTER — Other Ambulatory Visit
Admission: RE | Admit: 2019-11-13 | Discharge: 2019-11-13 | Disposition: A | Discharge status: Home / Self Care | Payer: Medicare Other | Source: Ambulatory Visit | Attending: Gastroenterology | Admitting: Gastroenterology

## 2019-11-13 DIAGNOSIS — Z20822 Contact with and (suspected) exposure to covid-19: Secondary | ICD-10-CM | POA: Insufficient documentation

## 2019-11-13 DIAGNOSIS — Z01812 Encounter for preprocedural laboratory examination: Secondary | ICD-10-CM | POA: Diagnosis present

## 2019-11-13 LAB — SARS CORONAVIRUS 2 (TAT 6-24 HRS): SARS Coronavirus 2: NEGATIVE

## 2019-11-14 ENCOUNTER — Encounter: Payer: Self-pay | Admitting: Internal Medicine

## 2019-11-15 ENCOUNTER — Ambulatory Visit
Admission: RE | Admit: 2019-11-15 | Discharge: 2019-11-15 | Disposition: A | Discharge status: Home / Self Care | Payer: Medicare Other | Attending: Gastroenterology | Admitting: Gastroenterology

## 2019-11-15 ENCOUNTER — Encounter
Admission: RE | Disposition: A | Discharge status: Home / Self Care | Payer: Self-pay | Source: Home / Self Care | Attending: Gastroenterology

## 2019-11-15 ENCOUNTER — Ambulatory Visit: Payer: Medicare Other | Admitting: Anesthesiology

## 2019-11-15 DIAGNOSIS — M1990 Unspecified osteoarthritis, unspecified site: Secondary | ICD-10-CM | POA: Diagnosis not present

## 2019-11-15 DIAGNOSIS — D123 Benign neoplasm of transverse colon: Secondary | ICD-10-CM | POA: Insufficient documentation

## 2019-11-15 DIAGNOSIS — K640 First degree hemorrhoids: Secondary | ICD-10-CM | POA: Insufficient documentation

## 2019-11-15 DIAGNOSIS — R933 Abnormal findings on diagnostic imaging of other parts of digestive tract: Secondary | ICD-10-CM | POA: Insufficient documentation

## 2019-11-15 DIAGNOSIS — Z87891 Personal history of nicotine dependence: Secondary | ICD-10-CM | POA: Diagnosis not present

## 2019-11-15 DIAGNOSIS — K5730 Diverticulosis of large intestine without perforation or abscess without bleeding: Secondary | ICD-10-CM | POA: Insufficient documentation

## 2019-11-15 DIAGNOSIS — Z880 Allergy status to penicillin: Secondary | ICD-10-CM | POA: Insufficient documentation

## 2019-11-15 DIAGNOSIS — K219 Gastro-esophageal reflux disease without esophagitis: Secondary | ICD-10-CM | POA: Insufficient documentation

## 2019-11-15 DIAGNOSIS — Z79899 Other long term (current) drug therapy: Secondary | ICD-10-CM | POA: Diagnosis not present

## 2019-11-15 DIAGNOSIS — K5732 Diverticulitis of large intestine without perforation or abscess without bleeding: Secondary | ICD-10-CM | POA: Diagnosis not present

## 2019-11-15 DIAGNOSIS — D509 Iron deficiency anemia, unspecified: Secondary | ICD-10-CM | POA: Insufficient documentation

## 2019-11-15 HISTORY — DX: Dyspnea, unspecified: R06.00

## 2019-11-15 HISTORY — PX: COLONOSCOPY WITH PROPOFOL: SHX5780

## 2019-11-15 HISTORY — DX: Anxiety disorder, unspecified: F41.9

## 2019-11-15 HISTORY — DX: Iron deficiency anemia, unspecified: D50.9

## 2019-11-15 SURGERY — COLONOSCOPY WITH PROPOFOL
Anesthesia: General

## 2019-11-15 MED ORDER — PROPOFOL 10 MG/ML IV BOLUS
INTRAVENOUS | Status: AC
  Filled 2019-11-15: qty 20

## 2019-11-15 MED ORDER — SODIUM CHLORIDE 0.9 % IV SOLN
INTRAVENOUS | Status: DC
  Administered 2019-11-15: 1000 mL via INTRAVENOUS

## 2019-11-15 MED ORDER — PROPOFOL 10 MG/ML IV BOLUS
INTRAVENOUS | Status: DC | PRN
  Administered 2019-11-15: 40 mg via INTRAVENOUS
  Administered 2019-11-15: 30 mg via INTRAVENOUS
  Administered 2019-11-15: 40 mg via INTRAVENOUS
  Administered 2019-11-15: 100 mg via INTRAVENOUS
  Administered 2019-11-15: 30 mg via INTRAVENOUS

## 2019-11-15 MED ORDER — PROPOFOL 500 MG/50ML IV EMUL
INTRAVENOUS | Status: AC
  Filled 2019-11-15: qty 50

## 2019-11-15 MED ORDER — LIDOCAINE HCL (CARDIAC) PF 100 MG/5ML IV SOSY
PREFILLED_SYRINGE | INTRAVENOUS | Status: DC | PRN
  Administered 2019-11-15: 50 mg via INTRAVENOUS

## 2019-11-15 NOTE — Op Note (Signed)
[facility] Gastroenterology Patient Name: Yolanda Barker Procedure Date: 11/15/2019 11:02 AM MRN: [id number] Account #: [id number] Date of Birth: [birth year] Admit Type: Outpatient Age: 64 Room: [facility] ENDO ROOM 3 Gender: Female Note Status: Finalized Procedure:             Colonoscopy Indications:           Abnormal CT of the GI tract, Diverticulitis Providers:             Andrey Farmer MD, MD Medicines:             Monitored Anesthesia Care Complications:         No immediate complications. Estimated blood loss:                         Minimal. Procedure:             Pre-Anesthesia Assessment:                        - Prior to the procedure, a History and Physical was                         performed, and patient medications and allergies were                         reviewed. The patient is competent. The risks and                         benefits of the procedure and the sedation options and                         risks were discussed with the patient. All questions                         were answered and informed consent was obtained.                         Patient identification and proposed procedure were                         verified by the physician, the nurse, the anesthetist                         and the technician in the endoscopy suite. Mental                         Status Examination: alert and oriented. Airway                         Examination: normal oropharyngeal airway and neck                         mobility. Respiratory Examination: clear to                         auscultation. CV Examination: normal. Prophylactic                         Antibiotics: The patient does not require prophylactic  antibiotics. Prior Anticoagulants: The patient has                         taken no previous anticoagulant or antiplatelet                         agents. ASA Grade Assessment: II - A patient with mild                          systemic disease. After reviewing the risks and                         benefits, the patient was deemed in satisfactory                         condition to undergo the procedure. The anesthesia                         plan was to use monitored anesthesia care (MAC).                         Immediately prior to administration of medications,                         the patient was re-assessed for adequacy to receive                         sedatives. The heart rate, respiratory rate, oxygen                         saturations, blood pressure, adequacy of pulmonary                         ventilation, and response to care were monitored                         throughout the procedure. The physical status of the                         patient was re-assessed after the procedure.                        After obtaining informed consent, the colonoscope was                         passed under direct vision. Throughout the procedure,                         the patient's blood pressure, pulse, and oxygen                         saturations were monitored continuously. The                         Colonoscope was introduced through the anus and                         advanced to the the cecum, identified by appendiceal  orifice and ileocecal valve. The colonoscopy was                         technically difficult and complex due to restricted                         mobility of the colon and a tortuous colon. Successful                         completion of the procedure was aided by applying                         abdominal pressure. The patient tolerated the                         procedure well. The quality of the bowel preparation                         was adequate to identify polyps. Findings:      The perianal and digital rectal examinations were normal.      The colon (entire examined portion) was moderately tortuous. Advancing       the scope required  applying abdominal pressure.      A 2 mm polyp was found in the transverse colon. The polyp was sessile.       The polyp was removed with a jumbo cold forceps. Resection and retrieval       were complete. Estimated blood loss was minimal.      Multiple small-mouthed diverticula were found in the sigmoid colon and       descending colon.      Non-bleeding internal hemorrhoids were found during retroflexion. The       hemorrhoids were Grade I (internal hemorrhoids that do not prolapse).      The exam was otherwise without abnormality on direct and retroflexion       views. Impression:            - Tortuous colon.                        - One 2 mm polyp in the transverse colon, removed with                         a jumbo cold forceps. Resected and retrieved.                        - Diverticulosis in the sigmoid colon and in the                         descending colon.                        - Non-bleeding internal hemorrhoids.                        - The examination was otherwise normal on direct and                         retroflexion views. Recommendation:        - Discharge patient to home.                        -  Resume previous diet.                        - Continue present medications.                        - Await pathology results.                        - Repeat colonoscopy for surveillance based on                         pathology results.                        - Return to referring physician as previously                         scheduled. Procedure Code(s):     --- Professional ---                        [phone], Colonoscopy, flexible; with biopsy, single or                         multiple Diagnosis Code(s):     --- Professional ---                        K63.5, Polyp of colon                        K64.0, First degree hemorrhoids                        K57.32, Diverticulitis of large intestine without                         perforation or abscess without  bleeding                        K57.30, Diverticulosis of large intestine without                         perforation or abscess without bleeding                        R93.3, Abnormal findings on diagnostic imaging of                         other parts of digestive tract                        Q43.8, Other specified congenital malformations of                         intestine CPT copyright 2019 American Medical Association. All rights reserved. The codes documented in this report are preliminary and upon coder review may  be revised to meet current compliance requirements. Andrey Farmer, MD Andrey Farmer MD, MD 11/15/2019 11:35:17 AM Number of Addenda: 0 Note Initiated On: 11/15/2019 11:02 AM Scope Withdrawal Time: 0 hours 10 minutes 32 seconds  Total Procedure Duration: 0 hours 20 minutes 51 seconds  Estimated Blood Loss:  Estimated  blood loss was minimal.      [facility]

## 2019-11-15 NOTE — Anesthesia Postprocedure Evaluation (Signed)
Anesthesia Post Note  Patient: Yolanda Barker  Procedure(s) Performed: COLONOSCOPY WITH PROPOFOL (N/A )  Patient location during evaluation: Endoscopy Anesthesia Type: General Level of consciousness: awake and alert Pain management: pain level controlled Vital Signs Assessment: post-procedure vital signs reviewed and stable Respiratory status: spontaneous breathing and respiratory function stable Cardiovascular status: stable Anesthetic complications: no   No complications documented.   Last Vitals:  Vitals:   11/15/19 1130 11/15/19 1131  BP:  124/61  Pulse: 62 62  Resp: (!) 26 (!) 26  Temp:  (!) 36 C  SpO2: 100% 100%    Last Pain:  Vitals:   11/15/19 1131  TempSrc: Temporal  PainSc:                  Uzoma Vivona K

## 2019-11-15 NOTE — Interval H&P Note (Signed)
History and Physical Interval Note:  11/15/2019 10:58 AM  Yolanda Barker  has presented today for surgery, with the diagnosis of F/U DIVERTICULITIS.  The various methods of treatment have been discussed with the patient and family. After consideration of risks, benefits and other options for treatment, the patient has consented to  Procedure(s): COLONOSCOPY WITH PROPOFOL (N/A) as a surgical intervention.  The patient's history has been reviewed, patient examined, no change in status, stable for surgery.  I have reviewed the patient's chart and labs.  Questions were answered to the patient's satisfaction.     Lesly Rubenstein  Ok to proceed with colonoscopy

## 2019-11-15 NOTE — Transfer of Care (Signed)
Immediate Anesthesia Transfer of Care Note  Patient: Yolanda Barker  Procedure(s) Performed: COLONOSCOPY WITH PROPOFOL (N/A )  Patient Location: Endoscopy Unit  Anesthesia Type:General  Level of Consciousness: drowsy  Airway & Oxygen Therapy: Patient Spontanous Breathing  Post-op Assessment: Report given to RN and Post -op Vital signs reviewed and stable  Post vital signs: Reviewed and stable  Last Vitals:  Vitals Value Taken Time  BP    Temp    Pulse 62 11/15/19 1130  Resp 26 11/15/19 1130  SpO2 100 % 11/15/19 1130  Vitals shown include unvalidated device data.  Last Pain:  Vitals:   11/15/19 1006  TempSrc: Temporal  PainSc: 0-No pain         Complications: No complications documented.

## 2019-11-15 NOTE — H&P (Signed)
Outpatient short stay form Pre-procedure 11/15/2019 10:51 AM Raylene Miyamoto MD, MPH  Primary Physician: [facility]  Reason for visit:  Hx of diverticulitis  History of present illness:   64 y/o with history of diverticulitis here for colonoscopy. Per last procedure has difficult colon. No blood thinners.    Current Facility-Administered Medications:  .  0.9 %  sodium chloride infusion, , Intravenous, Continuous, Cherrelle Plante, Hilton Cork, MD, Last Rate: 20 mL/hr at 11/15/19 1037, Continued from Pre-op at 11/15/19 1037  Medications Prior to Admission  Medication Sig Dispense Refill Last Dose  . cetirizine (ZYRTEC) 10 MG tablet Take 10 mg by mouth daily.     . Cholecalciferol (VITAMIN D-1000 MAX ST) 1000 units tablet Take 1,000 Units by mouth daily.    Past Week at Unknown time  . ferrous sulfate 325 (65 FE) MG tablet Take 325 mg by mouth daily.    Past Week at Unknown time  . acetaminophen (TYLENOL) 500 MG tablet Take 500 mg by mouth every 6 (six) hours as needed for moderate pain or headache.      . fexofenadine (ALLEGRA) 180 MG tablet Take 180 mg by mouth as needed.      . fluticasone (FLONASE ALLERGY RELIEF) 50 MCG/ACT nasal spray Place 2 sprays into both nostrils daily as needed for allergies.      Marland Kitchen ibuprofen (ADVIL) 600 MG tablet Take 1 tablet by mouth three times daily with meals 15 tablet 0   . lidocaine (LIDODERM) 5 % Place 1 patch onto the skin daily. Remove & Discard patch within 12 hours or as directed by MD 30 patch 0   . meclizine (ANTIVERT) 25 MG tablet Take 25 mg by mouth 3 (three) times daily as needed for dizziness.      . methocarbamol (ROBAXIN) 500 MG tablet Take 1 tablet (500 mg total) by mouth 2 (two) times daily as needed for muscle spasms. 10 tablet 0   . omeprazole (PRILOSEC) 20 MG capsule Take 20 mg by mouth 2 (two) times daily as needed (for acid reflux).      . ondansetron (ZOFRAN ODT) 4 MG disintegrating tablet Allow 1-2 tablets to dissolve in your  mouth every 8 hours as needed for nausea/vomiting 30 tablet 0   . Propylene Glycol (SYSTANE BALANCE) 0.6 % SOLN Place 1 drop into both eyes daily as needed (for dry eyes).        Allergies  Allergen Reactions  . Penicillin G Rash and Other (See Comments)    Has patient had a PCN reaction causing immediate rash, facial/tongue/throat swelling, SOB or lightheadedness with hypotension: Unknown Has patient had a PCN reaction causing severe rash involving mucus membranes or skin necrosis: Unknown Has patient had a PCN reaction that required hospitalization: No Has patient had a PCN reaction occurring within the last 10 years: No If all of the above answers are "NO", then may proceed with Cephalosporin use.      Past Medical History:  Diagnosis Date  . Anemia   . Anxiety   . Arthritis   . Diverticulitis   . Dyspnea   . GERD (gastroesophageal reflux disease)   . IDA (iron deficiency anemia)   . Vertigo     Review of systems:  Otherwise negative.    Physical Exam  Gen: Alert, oriented. Appears stated age.  HEENT: [place]/AT. PERRLA. Lungs: No respiratory distress Abd: soft, benign, no masses.  Ext: No edema.    Planned procedures: Proceed with colonoscopy. The patient understands  the nature of the planned procedure, indications, risks, alternatives and potential complications including but not limited to bleeding, infection, perforation, damage to internal organs and possible oversedation/side effects from anesthesia. The patient agrees and gives consent to proceed.  Please refer to procedure notes for findings, recommendations and patient disposition/instructions.     Raylene Miyamoto MD, MPH Gastroenterology 11/15/2019  10:51 AM

## 2019-11-15 NOTE — Anesthesia Preprocedure Evaluation (Signed)
Anesthesia Evaluation  Patient identified by MRN, date of birth, ID band Patient awake    Reviewed: Allergy & Precautions, NPO status , Patient's Chart, lab work & pertinent test results  History of Anesthesia Complications Negative for: history of anesthetic complications  Airway Mallampati: II       Dental  (+) Partial Upper   Pulmonary neg sleep apnea, neg COPD, Not current smoker, former smoker,           Cardiovascular (-) hypertension(-) Past MI and (-) CHF (-) dysrhythmias (-) Valvular Problems/Murmurs     Neuro/Psych neg Seizures Anxiety    GI/Hepatic Neg liver ROS, GERD  Medicated,  Endo/Other  neg diabetes  Renal/GU negative Renal ROS     Musculoskeletal   Abdominal   Peds  Hematology  (+) anemia ,   Anesthesia Other Findings   Reproductive/Obstetrics                             Anesthesia Physical Anesthesia Plan  ASA: II  Anesthesia Plan: General   Post-op Pain Management:    Induction: Intravenous  PONV Risk Score and Plan: 3 and Propofol infusion, TIVA and Treatment may vary due to age or medical condition  Airway Management Planned: Nasal Cannula  Additional Equipment:   Intra-op Plan:   Post-operative Plan:   Informed Consent: I have reviewed the patients History and Physical, chart, labs and discussed the procedure including the risks, benefits and alternatives for the proposed anesthesia with the patient or authorized representative who has indicated his/her understanding and acceptance.       Plan Discussed with:   Anesthesia Plan Comments:         Anesthesia Quick Evaluation

## 2019-11-17 ENCOUNTER — Encounter: Payer: Self-pay | Admitting: Gastroenterology

## 2019-11-19 LAB — SURGICAL PATHOLOGY

## 2020-03-02 ENCOUNTER — Other Ambulatory Visit: Payer: Self-pay | Admitting: Family Medicine

## 2020-03-02 DIAGNOSIS — Z1231 Encounter for screening mammogram for malignant neoplasm of breast: Secondary | ICD-10-CM

## 2020-04-06 ENCOUNTER — Ambulatory Visit
Admission: RE | Admit: 2020-04-06 | Discharge: 2020-04-06 | Disposition: A | Discharge status: Home / Self Care | Payer: Medicare Other | Source: Ambulatory Visit | Attending: Family Medicine | Admitting: Family Medicine

## 2020-04-06 ENCOUNTER — Other Ambulatory Visit: Payer: Self-pay

## 2020-04-06 DIAGNOSIS — Z1231 Encounter for screening mammogram for malignant neoplasm of breast: Secondary | ICD-10-CM | POA: Insufficient documentation

## 2020-05-11 ENCOUNTER — Emergency Department: Payer: Medicare Other

## 2020-05-11 ENCOUNTER — Emergency Department
Admission: EM | Admit: 2020-05-11 | Discharge: 2020-05-12 | Disposition: A | Discharge status: Home / Self Care | Payer: Medicare Other | Attending: Emergency Medicine | Admitting: Emergency Medicine

## 2020-05-11 ENCOUNTER — Other Ambulatory Visit: Payer: Self-pay

## 2020-05-11 DIAGNOSIS — K219 Gastro-esophageal reflux disease without esophagitis: Secondary | ICD-10-CM | POA: Diagnosis not present

## 2020-05-11 DIAGNOSIS — R1032 Left lower quadrant pain: Secondary | ICD-10-CM | POA: Insufficient documentation

## 2020-05-11 DIAGNOSIS — Z87891 Personal history of nicotine dependence: Secondary | ICD-10-CM | POA: Diagnosis not present

## 2020-05-11 DIAGNOSIS — K5792 Diverticulitis of intestine, part unspecified, without perforation or abscess without bleeding: Secondary | ICD-10-CM

## 2020-05-11 LAB — CBC
HCT: 36.4 % (ref 36.0–46.0)
Hemoglobin: 11.9 g/dL — ABNORMAL LOW (ref 12.0–15.0)
MCH: 27.7 pg (ref 26.0–34.0)
MCHC: 32.7 g/dL (ref 30.0–36.0)
MCV: 84.8 fL (ref 80.0–100.0)
Platelets: 275 10*3/uL (ref 150–400)
RBC: 4.29 MIL/uL (ref 3.87–5.11)
RDW: 13.2 % (ref 11.5–15.5)
WBC: 8.4 10*3/uL (ref 4.0–10.5)
nRBC: 0 % (ref 0.0–0.2)

## 2020-05-11 LAB — COMPREHENSIVE METABOLIC PANEL
ALT: 34 U/L (ref 0–44)
AST: 22 U/L (ref 15–41)
Albumin: 4 g/dL (ref 3.5–5.0)
Alkaline Phosphatase: 72 U/L (ref 38–126)
Anion gap: 7 (ref 5–15)
BUN: 19 mg/dL (ref 8–23)
CO2: 27 mmol/L (ref 22–32)
Calcium: 9.4 mg/dL (ref 8.9–10.3)
Chloride: 106 mmol/L (ref 98–111)
Creatinine, Ser: 0.74 mg/dL (ref 0.44–1.00)
GFR, Estimated: >60 mL/min (ref >60)
Glucose, Bld: 111 mg/dL — ABNORMAL HIGH (ref 70–99)
Potassium: 3.8 mmol/L (ref 3.5–5.1)
Sodium: 140 mmol/L (ref 135–145)
Total Bilirubin: 0.4 mg/dL (ref 0.3–1.2)
Total Protein: 7.3 g/dL (ref 6.5–8.1)

## 2020-05-11 LAB — URINALYSIS, COMPLETE (UACMP) WITH MICROSCOPIC
Bacteria, UA: NONE SEEN
Bilirubin Urine: NEGATIVE
Glucose, UA: NEGATIVE mg/dL
Ketones, ur: NEGATIVE mg/dL
Leukocytes,Ua: NEGATIVE
Nitrite: NEGATIVE
Protein, ur: NEGATIVE mg/dL
Specific Gravity, Urine: 1.021 (ref 1.005–1.030)
pH: 5 (ref 5.0–8.0)

## 2020-05-11 LAB — LIPASE, BLOOD: Lipase: 30 U/L (ref 11–51)

## 2020-05-11 MED ORDER — FENTANYL CITRATE (PF) 100 MCG/2ML IJ SOLN
50.0000 ug | Freq: Once | INTRAMUSCULAR | Status: AC
  Administered 2020-05-11: 50 ug via INTRAVENOUS
  Filled 2020-05-11: qty 2

## 2020-05-11 MED ORDER — IOHEXOL 300 MG/ML  SOLN
100.0000 mL | Freq: Once | INTRAMUSCULAR | Status: AC | PRN
  Administered 2020-05-11: 100 mL via INTRAVENOUS

## 2020-05-11 NOTE — ED Provider Notes (Signed)
[facility] Emergency Department Provider Note  ____________________________________________   Event Date/Time   First MD Initiated Contact with Patient 05/11/20 2228     (approximate)  I have reviewed the triage vital signs and the nursing notes.   HISTORY  Chief Complaint Abdominal Pain   HPI Yolanda Barker is a 65 y.o. female with past medical history of anemia, exotic arthritis, GERD, and diverticulitis who presents for assessment approximately 3 to 4 days of worsening left lower quadrant abdominal pain that she states feels very similar to prior diverticulitis episode she has had in the past.  She denies any headache, earache, sore throat, fevers, chills, cough, shortness of breath, nausea, vomiting, diarrhea, dysuria, back pain, right-sided abdominal pain or any other acute sick symptoms.  No clear alleviating aggravating factors.  No other acute concerns at this time.         Past Medical History:  Diagnosis Date  . Anemia   . Anxiety   . Arthritis   . Diverticulitis   . Dyspnea   . GERD (gastroesophageal reflux disease)   . IDA (iron deficiency anemia)   . Vertigo     There are no problems to display for this patient.   Past Surgical History:  Procedure Laterality Date  . ABDOMINAL HYSTERECTOMY    . COLONOSCOPY WITH PROPOFOL N/A 03/15/2016   Procedure: COLONOSCOPY WITH PROPOFOL;  Surgeon: Lollie Sails, MD;  Location: [facility] ENDOSCOPY;  Service: Endoscopy;  Laterality: N/A;  . COLONOSCOPY WITH PROPOFOL N/A 11/15/2019   Procedure: COLONOSCOPY WITH PROPOFOL;  Surgeon: Lesly Rubenstein, MD;  Location: [facility] ENDOSCOPY;  Service: Gastroenterology;  Laterality: N/A;  . SHOULDER ARTHROSCOPY WITH OPEN ROTATOR CUFF REPAIR Right 11/14/2017   Procedure: SHOULDER ARTHROSCOPY WITH OPEN ROTATOR CUFF REPAIR;  Surgeon: Corky Mull, MD;  Location: [facility] ORS;  Service: Orthopedics;  Laterality: Right;  . SHOULDER CLOSED REDUCTION Right 03/13/2018    Procedure: CLOSED MANIPULATION SHOULDER;  Surgeon: Corky Mull, MD;  Location: [facility] ORS;  Service: Orthopedics;  Laterality: Right;  . STERIOD INJECTION Right 03/13/2018   Procedure: STEROID INJECTION RIGHT SHOULDER;  Surgeon: Corky Mull, MD;  Location: [facility] ORS;  Service: Orthopedics;  Laterality: Right;  . TONSILLECTOMY    . WISDOM TOOTH EXTRACTION      Prior to Admission medications   Medication Sig Start Date End Date Taking? Authorizing Provider  acetaminophen (TYLENOL) 500 MG tablet Take 500 mg by mouth every 6 (six) hours as needed for moderate pain or headache.     [provider]  cetirizine (ZYRTEC) 10 MG tablet Take 10 mg by mouth daily.    [provider]  Cholecalciferol (VITAMIN D-1000 MAX ST) 1000 units tablet Take 1,000 Units by mouth daily.     [provider]  ferrous sulfate 325 (65 FE) MG tablet Take 325 mg by mouth daily.     [provider]  fexofenadine (ALLEGRA) 180 MG tablet Take 180 mg by mouth as needed.     [provider]  fluticasone (FLONASE ALLERGY RELIEF) 50 MCG/ACT nasal spray Place 2 sprays into both nostrils daily as needed for allergies.     [provider]  ibuprofen (ADVIL) 600 MG tablet Take 1 tablet by mouth three times daily with meals 10/22/18   Hinda Kehr, MD  lidocaine (LIDODERM) 5 % Place 1 patch onto the skin daily. Remove & Discard patch within 12 hours or as directed by MD 08/24/19   Laban Emperor, PA-C  meclizine (ANTIVERT) 25 MG tablet Take 25 mg by mouth 3 (three) times daily as needed for dizziness.     [provider]  methocarbamol (ROBAXIN) 500 MG tablet Take 1 tablet (500 mg total) by mouth 2 (two) times daily as needed for muscle spasms. 08/24/19   Laban Emperor, PA-C  omeprazole (PRILOSEC) 20 MG capsule Take 20 mg by mouth 2 (two) times daily as needed (for acid reflux).     [provider]  ondansetron (ZOFRAN ODT) 4 MG disintegrating tablet Allow 1-2 tablets  to dissolve in your mouth every 8 hours as needed for nausea/vomiting 10/22/18   Hinda Kehr, MD  Propylene Glycol (SYSTANE BALANCE) 0.6 % SOLN Place 1 drop into both eyes daily as needed (for dry eyes).    [provider]    Allergies Penicillin g  Family History  Problem Relation Age of Onset  . Bone cancer Mother   . Stroke Father   . Breast cancer Neg Hx     Social History Social History   Tobacco Use  . Smoking status: Former Smoker    Packs/day: 1.00    Years: 15.00    Pack years: 15.00    Types: Cigarettes    Quit date: 11/07/1997    Years since quitting: 22.5  . Smokeless tobacco: Never Used  Vaping Use  . Vaping Use: Never used  Substance Use Topics  . Alcohol use: No  . Drug use: No    Review of Systems  Review of Systems  Constitutional: Negative for chills and fever.  HENT: Negative for sore throat.   Eyes: Negative for pain.  Respiratory: Negative for cough and stridor.   Cardiovascular: Negative for chest pain.  Gastrointestinal: Positive for abdominal pain. Negative for vomiting.  Genitourinary: Negative for dysuria.  Musculoskeletal: Negative for myalgias.  Skin: Negative for rash.  Neurological: Negative for seizures, loss of consciousness and headaches.  Psychiatric/Behavioral: Negative for suicidal ideas.  All other systems reviewed and are negative.     ____________________________________________   PHYSICAL EXAM:  VITAL SIGNS: ED Triage Vitals  Enc Vitals Group     BP 05/11/20 2113 136/77     Pulse Rate 05/11/20 2113 86     Resp 05/11/20 2113 20     Temp 05/11/20 2115 98 F (36.7 C)     Temp Source 05/11/20 2115 Oral     SpO2 05/11/20 2113 100 %     Weight 05/11/20 2113 138 lb (62.6 kg)     Height 05/11/20 2113 4\' 11"  (1.499 m)     Head Circumference --      Peak Flow --      Pain Score 05/11/20 2113 8     Pain Loc --      Pain Edu? --      Excl. in [place]? --    Vitals:   05/11/20 2113 05/11/20 2115  BP: 136/77    Pulse: 86   Resp: 20   Temp:  98 F (36.7 C)  SpO2: 100%    Physical Exam Vitals and nursing note reviewed.  Constitutional:      General: She is not in acute distress.    Appearance: She is well-developed and well-nourished.  HENT:     Head: Normocephalic and atraumatic.     Right Ear: External ear normal.     Left Ear: External ear normal.     Nose: Nose normal.     Mouth/Throat:     Mouth: Mucous membranes are moist.  Eyes:     Conjunctiva/sclera: Conjunctivae normal.  Cardiovascular:     Rate and Rhythm: Normal rate and regular rhythm.     Heart sounds: No murmur heard.   Pulmonary:     Effort: Pulmonary effort is normal. No respiratory distress.     Breath sounds: Normal breath sounds.  Abdominal:     Palpations: Abdomen is soft.     Tenderness: There is abdominal tenderness in the left upper quadrant and left lower quadrant. There is no right CVA tenderness or left CVA tenderness.  Musculoskeletal:        General: No edema.     Cervical back: Neck supple.  Skin:    General: Skin is warm and dry.     Capillary Refill: Capillary refill takes less than 2 seconds.  Neurological:     Mental Status: She is alert and oriented to person, place, and time.  Psychiatric:        Mood and Affect: Mood and affect and mood normal.      ____________________________________________   LABS (all labs ordered are listed, but only abnormal results are displayed)  Labs Reviewed  CBC - Abnormal; Notable for the following components:      Result Value   Hemoglobin 11.9 (*)    All other components within normal limits  COMPREHENSIVE METABOLIC PANEL - Abnormal; Notable for the following components:   Glucose, Bld 111 (*)    All other components within normal limits  URINALYSIS, COMPLETE (UACMP) WITH MICROSCOPIC - Abnormal; Notable for the following components:   Color, Urine YELLOW (*)    APPearance CLEAR (*)    Hgb urine dipstick SMALL (*)    All other components within  normal limits  LIPASE, BLOOD   ____________________________________________  EKG  ____________________________________________  RADIOLOGY  ED MD interpretation:    Official radiology report(s): No results found.  ____________________________________________   PROCEDURES  Procedure(s) performed (including Critical Care):  Procedures   ____________________________________________   INITIAL IMPRESSION / ASSESSMENT AND PLAN / ED COURSE      Patient presents with above to history exam for assessment of some left lower quadrant abdominal pain this is worsening over last couple days.  On arrival she is afebrile and hemodynamically stable.  She is tender in her left lower quadrant.  Differential includes but is not limited to diverticulitis, kidney stone, cystitis, internal hernia and MSK.  She denies any chest pain  although we will plan to obtain ECG to assess for possible presentation of ACS.  CMP shows no significant electrolyte or metabolic derangements.  No evidence of cholestasis or hepatitis.  No tenderness in the right upper quadrant to suggest cholecystitis.  CBC shows no leukocytosis or acute anemia.  Lipase of 30 is not consistent with acute pancreatitis.  UA shows no evidence of infection does have small blood.  We will plan to obtain CT to assess for evidence of stone versus diverticulitis.  Patient given below noted analgesia pending CT.  Care patient signed over to oncoming provider at approximately 2200.  Plan is to follow-up ECG, CT, and reassess patient. ____________________________________________   FINAL CLINICAL IMPRESSION(S) / ED DIAGNOSES  Final diagnoses:  Left lower quadrant abdominal pain    Medications  fentaNYL (SUBLIMAZE) injection 50 mcg (has no administration in time range)     ED Discharge Orders    None       Note:  This document was prepared using Dragon voice recognition software and may include unintentional dictation  errors.  Lucrezia Starch, MD 05/11/20 2251

## 2020-05-11 NOTE — ED Triage Notes (Signed)
Pt in with co lower abd pain states hx of the same and was dx with diverticulitis. Pt denies any n.v.d.

## 2020-05-11 NOTE — ED Provider Notes (Incomplete)
-----------------------------------------   11:00 PM on 05/11/2020 -----------------------------------------  Assuming care from Dr. Creig Hines.  In short, Yolanda Barker is a 65 y.o. female with a chief complaint of LLQ pain.  Refer to the original H&P for additional details.  The current plan of care is to follow up CT scan and reassess.   ----------------------------------------- 12:16 AM on 05/12/2020 -----------------------------------------  CT confirms presence of uncomplicated diverticulitis.  Reassessed patient; still has some pain and tenderness in LLQ, but she says that she would like to go home and I think that is appropriate given her reassuring exam and work-up.  I reviewed her record and she has a history of penicillin allergy which she confirmed.  She has been treated successfully in the past (including by me) for uncomplicated diverticulitis with Cipro and Flagyl, so even though that is not necessarily my preference I will proceed with treatment.  I am giving her first dose of Cipro and Flagyl each 500 mg by mouth in the ED as well as Tylenol 1000 mg.  We talked about pain management and she declines "anything stronger" and says she is comfortable taking Tylenol.  I will also give her a prescription for Zofran.  I gave my usual and customary management recommendations and return precautions as well as strongly encouraging her to follow-up with the next available opportunity with her primary care doctor.  ED ECG REPORT I, Hinda Kehr, the attending physician, personally viewed and interpreted this ECG.  Date: 05/12/2020 EKG Time: 00: 19 Rate: 64 Rhythm: normal sinus rhythm QRS Axis: normal Intervals: normal ST/T Wave abnormalities: Some nonspecific changes but nothing indicative of ischemia. Narrative Interpretation: no evidence of acute ischemia   ED Discharge Orders         Ordered    ciprofloxacin (CIPRO) 500 MG tablet  2 times daily        05/12/20 0014    metroNIDAZOLE  (FLAGYL) 500 MG tablet  3 times daily        05/12/20 0014    docusate sodium (COLACE) 100 MG capsule        05/12/20 0014         Final diagnoses:  Left lower quadrant abdominal pain  Diverticulitis      Hinda Kehr, MD 05/12/20 2449    Hinda Kehr, MD 05/12/20 0045

## 2020-05-12 MED ORDER — METRONIDAZOLE 500 MG PO TABS
500.0000 mg | ORAL_TABLET | Freq: Once | ORAL | Status: AC
Start: 2020-05-12 — End: 2020-05-12
  Administered 2020-05-12: 500 mg via ORAL
  Filled 2020-05-12: qty 1

## 2020-05-12 MED ORDER — DOCUSATE SODIUM 100 MG PO CAPS: ORAL_CAPSULE | ORAL | 0 refills | Status: AC

## 2020-05-12 MED ORDER — METRONIDAZOLE 500 MG PO TABS: 500.0000 mg | ORAL_TABLET | Freq: Three times a day (TID) | ORAL | 0 refills | Status: AC

## 2020-05-12 MED ORDER — CIPROFLOXACIN HCL 500 MG PO TABS: 500.0000 mg | ORAL_TABLET | Freq: Two times a day (BID) | ORAL | 0 refills | Status: AC

## 2020-05-12 MED ORDER — ACETAMINOPHEN 500 MG PO TABS
1000.0000 mg | ORAL_TABLET | Freq: Once | ORAL | Status: AC
  Administered 2020-05-12: 1000 mg via ORAL
  Filled 2020-05-12: qty 2

## 2020-05-12 MED ORDER — CIPROFLOXACIN HCL 500 MG PO TABS
500.0000 mg | ORAL_TABLET | ORAL | Status: AC
  Administered 2020-05-12: 500 mg via ORAL
  Filled 2020-05-12: qty 1

## 2020-05-12 NOTE — ED Notes (Signed)
E-sign not working in [employer]. Printed and signed copy placed into medical records chart bin.

## 2020-05-12 NOTE — Discharge Instructions (Signed)
We believe your symptoms are caused by diverticulitis.  Most of the time this condition (please read through the included information) can be cured with outpatient antibiotics.  Please take the full course of prescribed medication(s) and follow up with the doctors recommended above.  Return to the ED if your abdominal pain worsens or fails to improve, you develop bloody vomiting, bloody diarrhea, you are unable to tolerate fluids due to vomiting, fever greater than 101, or other symptoms that concern you.    

## 2020-05-26 ENCOUNTER — Other Ambulatory Visit: Payer: Self-pay

## 2020-05-26 DIAGNOSIS — R1032 Left lower quadrant pain: Secondary | ICD-10-CM | POA: Diagnosis present

## 2020-05-26 DIAGNOSIS — Z87891 Personal history of nicotine dependence: Secondary | ICD-10-CM | POA: Insufficient documentation

## 2020-05-26 DIAGNOSIS — K5732 Diverticulitis of large intestine without perforation or abscess without bleeding: Secondary | ICD-10-CM | POA: Diagnosis not present

## 2020-05-26 LAB — URINALYSIS, COMPLETE (UACMP) WITH MICROSCOPIC
Bacteria, UA: NONE SEEN
Bilirubin Urine: NEGATIVE
Glucose, UA: NEGATIVE mg/dL
Ketones, ur: NEGATIVE mg/dL
Nitrite: NEGATIVE
Protein, ur: NEGATIVE mg/dL
Specific Gravity, Urine: 1.023 (ref 1.005–1.030)
pH: 5 (ref 5.0–8.0)

## 2020-05-26 LAB — COMPREHENSIVE METABOLIC PANEL
ALT: 26 U/L (ref 0–44)
AST: 20 U/L (ref 15–41)
Albumin: 4 g/dL (ref 3.5–5.0)
Alkaline Phosphatase: 61 U/L (ref 38–126)
Anion gap: 7 (ref 5–15)
BUN: 24 mg/dL — ABNORMAL HIGH (ref 8–23)
CO2: 26 mmol/L (ref 22–32)
Calcium: 9.3 mg/dL (ref 8.9–10.3)
Chloride: 105 mmol/L (ref 98–111)
Creatinine, Ser: 0.83 mg/dL (ref 0.44–1.00)
GFR, Estimated: >60 mL/min (ref >60)
Glucose, Bld: 100 mg/dL — ABNORMAL HIGH (ref 70–99)
Potassium: 4 mmol/L (ref 3.5–5.1)
Sodium: 138 mmol/L (ref 135–145)
Total Bilirubin: 0.5 mg/dL (ref 0.3–1.2)
Total Protein: 7 g/dL (ref 6.5–8.1)

## 2020-05-26 LAB — CBC
HCT: 34.9 % — ABNORMAL LOW (ref 36.0–46.0)
Hemoglobin: 11.2 g/dL — ABNORMAL LOW (ref 12.0–15.0)
MCH: 27.7 pg (ref 26.0–34.0)
MCHC: 32.1 g/dL (ref 30.0–36.0)
MCV: 86.2 fL (ref 80.0–100.0)
Platelets: 292 10*3/uL (ref 150–400)
RBC: 4.05 MIL/uL (ref 3.87–5.11)
RDW: 13.4 % (ref 11.5–15.5)
WBC: 8.6 10*3/uL (ref 4.0–10.5)
nRBC: 0 % (ref 0.0–0.2)

## 2020-05-26 LAB — LIPASE, BLOOD: Lipase: 34 U/L (ref 11–51)

## 2020-05-26 NOTE — ED Triage Notes (Signed)
LLQ abd pain. Recent dx of diverticulitis. Reports pain onset again today. Denies n/v/d. Denies bloody stool. Denies dysuria or increased urinary frequency.

## 2020-05-27 ENCOUNTER — Emergency Department: Payer: Medicare Other

## 2020-05-27 ENCOUNTER — Emergency Department
Admission: EM | Admit: 2020-05-27 | Discharge: 2020-05-27 | Disposition: A | Discharge status: Home / Self Care | Payer: Medicare Other | Attending: Emergency Medicine | Admitting: Emergency Medicine

## 2020-05-27 DIAGNOSIS — K5732 Diverticulitis of large intestine without perforation or abscess without bleeding: Secondary | ICD-10-CM

## 2020-05-27 MED ORDER — METRONIDAZOLE 500 MG PO TABS: 500.0000 mg | ORAL_TABLET | Freq: Three times a day (TID) | ORAL | 0 refills | Status: AC

## 2020-05-27 MED ORDER — SODIUM CHLORIDE 0.9 % IV SOLN: INTRAVENOUS | Status: DC

## 2020-05-27 MED ORDER — PROBIOTIC ADVANCED PO CAPS: 1.0000 | ORAL_CAPSULE | Freq: Every day | ORAL | 0 refills | Status: DC

## 2020-05-27 MED ORDER — ONDANSETRON HCL 4 MG/2ML IJ SOLN
4.0000 mg | Freq: Once | INTRAMUSCULAR | Status: AC
  Administered 2020-05-27: 4 mg via INTRAVENOUS
  Filled 2020-05-27: qty 2

## 2020-05-27 MED ORDER — METRONIDAZOLE 500 MG PO TABS
500.0000 mg | ORAL_TABLET | Freq: Once | ORAL | Status: AC
  Administered 2020-05-27: 500 mg via ORAL
  Filled 2020-05-27: qty 1

## 2020-05-27 MED ORDER — IOHEXOL 300 MG/ML  SOLN
100.0000 mL | Freq: Once | INTRAMUSCULAR | Status: AC | PRN
  Administered 2020-05-27: 100 mL via INTRAVENOUS

## 2020-05-27 MED ORDER — ONDANSETRON 4 MG PO TBDP: 4.0000 mg | ORAL_TABLET | Freq: Four times a day (QID) | ORAL | 0 refills | Status: DC | PRN

## 2020-05-27 MED ORDER — HYDROCODONE-ACETAMINOPHEN 5-325 MG PO TABS: 1.0000 | ORAL_TABLET | ORAL | 0 refills | Status: DC | PRN

## 2020-05-27 MED ORDER — CIPROFLOXACIN HCL 500 MG PO TABS: 500.0000 mg | ORAL_TABLET | Freq: Two times a day (BID) | ORAL | 0 refills | Status: AC

## 2020-05-27 MED ORDER — FENTANYL CITRATE (PF) 100 MCG/2ML IJ SOLN
50.0000 ug | Freq: Once | INTRAMUSCULAR | Status: AC
  Administered 2020-05-27: 50 ug via INTRAVENOUS
  Filled 2020-05-27: qty 2

## 2020-05-27 MED ORDER — CIPROFLOXACIN HCL 500 MG PO TABS
500.0000 mg | ORAL_TABLET | Freq: Once | ORAL | Status: AC
  Administered 2020-05-27: 500 mg via ORAL
  Filled 2020-05-27: qty 1

## 2020-05-27 NOTE — ED Provider Notes (Signed)
[facility] Emergency Department Provider Note  ____________________________________________   Event Date/Time   First MD Initiated Contact with Patient 05/27/20 0231     (approximate)  I have reviewed the triage vital signs and the nursing notes.   HISTORY  Chief Complaint Abdominal Pain    HPI Yolanda Barker is a 65 y.o. female with history of recurrent diverticulitis, previous hysterectomy who presents to the emergency department complaints of severe left lower quadrant pain that started tonight.  Has had associated nausea without vomiting.  No diarrhea, bloody stools or melena.  No fever.  She was just seen in the emergency department on 05/11/2020 and diagnosed with diverticulitis.  States she finished her Cipro but not not completely finished her Flagyl.  States she has follow-up with her primary care physician tomorrow.        Past Medical History:  Diagnosis Date  . Anemia   . Anxiety   . Arthritis   . Diverticulitis   . Dyspnea   . GERD (gastroesophageal reflux disease)   . IDA (iron deficiency anemia)   . Vertigo     There are no problems to display for this patient.   Past Surgical History:  Procedure Laterality Date  . ABDOMINAL HYSTERECTOMY    . COLONOSCOPY WITH PROPOFOL N/A 03/15/2016   Procedure: COLONOSCOPY WITH PROPOFOL;  Surgeon: Lollie Sails, MD;  Location: [facility] ENDOSCOPY;  Service: Endoscopy;  Laterality: N/A;  . COLONOSCOPY WITH PROPOFOL N/A 11/15/2019   Procedure: COLONOSCOPY WITH PROPOFOL;  Surgeon: Lesly Rubenstein, MD;  Location: [facility] ENDOSCOPY;  Service: Gastroenterology;  Laterality: N/A;  . SHOULDER ARTHROSCOPY WITH OPEN ROTATOR CUFF REPAIR Right 11/14/2017   Procedure: SHOULDER ARTHROSCOPY WITH OPEN ROTATOR CUFF REPAIR;  Surgeon: Corky Mull, MD;  Location: [facility] ORS;  Service: Orthopedics;  Laterality: Right;  . SHOULDER CLOSED REDUCTION Right 03/13/2018   Procedure: CLOSED MANIPULATION SHOULDER;  Surgeon: Corky Mull, MD;  Location: [facility] ORS;  Service: Orthopedics;  Laterality: Right;  . STERIOD INJECTION Right 03/13/2018   Procedure: STEROID INJECTION RIGHT SHOULDER;  Surgeon: Corky Mull, MD;  Location: [facility] ORS;  Service: Orthopedics;  Laterality: Right;  . TONSILLECTOMY    . WISDOM TOOTH EXTRACTION      Prior to Admission medications   Medication Sig Start Date End Date Taking? Authorizing Provider  ciprofloxacin (CIPRO) 500 MG tablet Take 1 tablet (500 mg total) by mouth 2 (two) times daily for 10 days. 05/27/20 06/06/20 Yes Charvi Gammage, Delice Bison, DO  HYDROcodone-acetaminophen (NORCO/VICODIN) 5-325 MG tablet Take 1 tablet by mouth every 4 (four) hours as needed. 05/27/20  Yes Delcie Ruppert, Delice Bison, DO  metroNIDAZOLE (FLAGYL) 500 MG tablet Take 1 tablet (500 mg total) by mouth 3 (three) times daily for 10 days. 05/27/20 06/06/20 Yes Amarachukwu Lakatos N, DO  ondansetron (ZOFRAN ODT) 4 MG disintegrating tablet Take 1 tablet (4 mg total) by mouth every 6 (six) hours as needed for nausea or vomiting. 05/27/20  Yes Aayat Hajjar, Delice Bison, DO  Probiotic Product (PROBIOTIC ADVANCED) CAPS Take 1 tablet by mouth daily. 05/27/20  Yes Richardson Dubree, Delice Bison, DO  acetaminophen (TYLENOL) 500 MG tablet Take 500 mg by mouth every 6 (six) hours as needed for moderate pain or headache.     [provider]  cetirizine (ZYRTEC) 10 MG tablet Take 10 mg by mouth daily.    [provider]  Cholecalciferol (VITAMIN D-1000 MAX ST) 1000 units tablet Take 1,000 Units by mouth daily.  [provider]  docusate sodium (COLACE) 100 MG capsule Take 1 tablet once or twice daily as needed for constipation 05/12/20   Hinda Kehr, MD  ferrous sulfate 325 (65 FE) MG tablet Take 325 mg by mouth daily.     [provider]  fexofenadine (ALLEGRA) 180 MG tablet Take 180 mg by mouth as needed.     [provider]  fluticasone (FLONASE ALLERGY RELIEF) 50 MCG/ACT nasal spray Place 2 sprays into both nostrils daily as needed  for allergies.     [provider]  ibuprofen (ADVIL) 600 MG tablet Take 1 tablet by mouth three times daily with meals 10/22/18   Hinda Kehr, MD  lidocaine (LIDODERM) 5 % Place 1 patch onto the skin daily. Remove & Discard patch within 12 hours or as directed by MD 08/24/19   Laban Emperor, PA-C  meclizine (ANTIVERT) 25 MG tablet Take 25 mg by mouth 3 (three) times daily as needed for dizziness.     [provider]  methocarbamol (ROBAXIN) 500 MG tablet Take 1 tablet (500 mg total) by mouth 2 (two) times daily as needed for muscle spasms. 08/24/19   Laban Emperor, PA-C  omeprazole (PRILOSEC) 20 MG capsule Take 20 mg by mouth 2 (two) times daily as needed (for acid reflux).     [provider]  Propylene Glycol (SYSTANE BALANCE) 0.6 % SOLN Place 1 drop into both eyes daily as needed (for dry eyes).    [provider]    Allergies Penicillin g  Family History  Problem Relation Age of Onset  . Bone cancer Mother   . Stroke Father   . Breast cancer Neg Hx     Social History Social History   Tobacco Use  . Smoking status: Former Smoker    Packs/day: 1.00    Years: 15.00    Pack years: 15.00    Types: Cigarettes    Quit date: 11/07/1997    Years since quitting: 22.5  . Smokeless tobacco: Never Used  Vaping Use  . Vaping Use: Never used  Substance Use Topics  . Alcohol use: No  . Drug use: No    Review of Systems Constitutional: No fever. Eyes: No visual changes. ENT: No sore throat. Cardiovascular: Denies chest pain. Respiratory: Denies shortness of breath. Gastrointestinal: No vomiting, diarrhea. Genitourinary: Negative for dysuria. Musculoskeletal: Negative for back pain. Skin: Negative for rash. Neurological: Negative for focal weakness or numbness.  ____________________________________________   PHYSICAL EXAM:  VITAL SIGNS: ED Triage Vitals  Enc Vitals Group     BP 05/26/20 2227 130/76     Pulse Rate 05/26/20 2226 72      Resp 05/26/20 2226 18     Temp 05/26/20 2226 97.9 F (36.6 C)     Temp Source 05/26/20 2226 Oral     SpO2 05/26/20 2226 99 %     Weight 05/26/20 2227 138 lb (62.6 kg)     Height 05/26/20 2227 4\' 11"  (1.499 m)     Head Circumference --      Peak Flow --      Pain Score 05/26/20 2226 7     Pain Loc --      Pain Edu? --      Excl. in [place]? --    CONSTITUTIONAL: Alert and oriented and responds appropriately to questions. Well-appearing; well-nourished HEAD: Normocephalic EYES: Conjunctivae clear, pupils appear equal, EOM appear intact ENT: normal nose; moist mucous membranes NECK: Supple, normal ROM CARD: RRR; S1 and  S2 appreciated; no murmurs, no clicks, no rubs, no gallops RESP: Normal chest excursion without splinting or tachypnea; breath sounds clear and equal bilaterally; no wheezes, no rhonchi, no rales, no hypoxia or respiratory distress, speaking full sentences ABD/GI: Normal bowel sounds; non-distended; soft, tender in the left lower quadrant with voluntary guarding.  Patient grabs my hand off of her abdomen and pushes me away with just light palpation. BACK: The back appears normal EXT: Normal ROM in all joints; no deformity noted, no edema; no cyanosis SKIN: Normal color for age and race; warm; no rash on exposed skin NEURO: Moves all extremities equally PSYCH: The patient's mood and manner are appropriate.  ____________________________________________   LABS (all labs ordered are listed, but only abnormal results are displayed)  Labs Reviewed  COMPREHENSIVE METABOLIC PANEL - Abnormal; Notable for the following components:      Result Value   Glucose, Bld 100 (*)    BUN 24 (*)    All other components within normal limits  CBC - Abnormal; Notable for the following components:   Hemoglobin 11.2 (*)    HCT 34.9 (*)    All other components within normal limits  URINALYSIS, COMPLETE (UACMP) WITH MICROSCOPIC - Abnormal; Notable for the following components:   Color, Urine  YELLOW (*)    APPearance CLEAR (*)    Hgb urine dipstick SMALL (*)    Leukocytes,Ua SMALL (*)    All other components within normal limits  LIPASE, BLOOD   ____________________________________________  EKG  None ____________________________________________  RADIOLOGY I, Nera Haworth, personally viewed and evaluated these images (plain radiographs) as part of my medical decision making, as well as reviewing the written report by the radiologist.  ED MD interpretation: Diverticulitis without abscess or perforation.  Official radiology report(s): CT ABDOMEN PELVIS W CONTRAST  Result Date: 05/27/2020 CLINICAL DATA:  Left lower quadrant abdominal pain. Diverticulitis suspected. EXAM: CT ABDOMEN AND PELVIS WITH CONTRAST TECHNIQUE: Multidetector CT imaging of the abdomen and pelvis was performed using the standard protocol following bolus administration of intravenous contrast. CONTRAST:  18mL OMNIPAQUE IOHEXOL 300 MG/ML  SOLN COMPARISON:  CT abdomen pelvis 05/11/2020 FINDINGS: Lower chest: No acute abnormality. Hepatobiliary: No focal liver abnormality. No gallstones, gallbladder wall thickening, or pericholecystic fluid. No biliary dilatation. Pancreas: No focal lesion. Normal pancreatic contour. No surrounding inflammatory changes. No main pancreatic ductal dilatation. Spleen: Normal in size without focal abnormality. Adrenals/Urinary Tract: No adrenal nodule bilaterally. Bilateral kidneys enhance symmetrically. Total of at least 5 calcified stones within the left kidney measure up to 6 mm. Punctate calcified stone within the inferior pole of the right kidney. No hydronephrosis. No hydroureter. The urinary bladder is unremarkable. Stomach/Bowel: Ascending colectomy. Stomach is within normal limits. No evidence of bowel wall thickening or dilatation. Diffuse sigmoid diverticulosis. Improved but persistent mild inflammatory changes of the distal sigmoid colon in the setting of diverticulosis.  Interval development of another region of inflammatory changes involving the distal descending colon/proximal sigmoid colon. No definite intramural abscess formation. Vascular/Lymphatic: No abdominal aorta or iliac aneurysm. Mild atherosclerotic plaque of the aorta and its branches. No abdominal, pelvic, or inguinal lymphadenopathy. Reproductive: Status post hysterectomy. No adnexal masses. Other: No intraperitoneal free fluid. No intraperitoneal free gas. No organized fluid collection. Musculoskeletal: No acute or significant osseous findings. IMPRESSION: 1. Interval development of another region of acute diverticulitis now involving a Ferron segment of proximal sigmoid colon with persistent but decreased inflammatory changes of mid/distal sigmoid colon diverticulitis. No associated bowel obstruction or abscess formation. Recommend  follow-up with colonoscopy status post treatment and status post complete resolution of inflammatory changes to exclude an underlying lesion. 2. Nonobstructive bilateral nephrolithiasis measuring up to 6 mm on the left and punctate on the right. Electronically Signed   By: Iven Finn M.D.   On: 05/27/2020 04:13    ____________________________________________   PROCEDURES  Procedure(s) performed (including Critical Care):  Procedures   ____________________________________________   INITIAL IMPRESSION / ASSESSMENT AND PLAN / ED COURSE  As part of my medical decision making, I reviewed the following data within the [employer] notes reviewed and incorporated, Labs reviewed , Old chart reviewed, Notes from prior ED visits and [place] Controlled Substance Database         Patient here with left lower quadrant pain.  Suspect diverticulitis but I am concerned for possible complication given symptoms recurring just 2 weeks after her last episode and she is exquisitely tender on exam.  Will repeat CT imaging.  Labs reassuring today with no  leukocytosis.  She does not appear toxic, septic.  She is afebrile.  Will give pain and nausea medicine, IV fluids.  ED PROGRESS  Patient CT scan shows interval development of acute diverticulitis in the proximal sigmoid colon with improving inflammatory changes of the mid/distal sigmoid diverticulitis.  There is no obstruction, abscess, perforation seen.  I have recommended that she go back on antibiotics and follow-up with GI as an outpatient.  She states she does have a gastroenterologist.  She reports she is allergic to penicillins.  Will prescribe Cipro, Flagyl, probiotic as well as pain and nausea medicine.  Discussed return precautions.  She is comfortable with this plan.  She is tolerating p.o.   At this time, I do not feel there is any life-threatening condition present. I have reviewed, interpreted and discussed all results (EKG, imaging, lab, urine as appropriate) and exam findings with patient/family. I have reviewed nursing notes and appropriate previous records.  I feel the patient is safe to be discharged home without further emergent workup and can continue workup as an outpatient as needed. Discussed usual and customary return precautions. Patient/family verbalize understanding and are comfortable with this plan.  Outpatient follow-up has been provided as needed. All questions have been answered.  ____________________________________________   FINAL CLINICAL IMPRESSION(S) / ED DIAGNOSES  Final diagnoses:  Sigmoid diverticulitis     ED Discharge Orders         Ordered    ciprofloxacin (CIPRO) 500 MG tablet  2 times daily        05/27/20 0442    metroNIDAZOLE (FLAGYL) 500 MG tablet  3 times daily        05/27/20 0442    Probiotic Product (PROBIOTIC ADVANCED) CAPS  Daily        05/27/20 0442    ondansetron (ZOFRAN ODT) 4 MG disintegrating tablet  Every 6 hours PRN        05/27/20 0442    HYDROcodone-acetaminophen (NORCO/VICODIN) 5-325 MG tablet  Every 4 hours PRN         05/27/20 0442          *Please note:  Kelsey E Plucinski was evaluated in Emergency Department on 05/27/2020 for the symptoms described in the history of present illness. She was evaluated in the context of the global COVID-19 pandemic, which necessitated consideration that the patient might be at risk for infection with the SARS-CoV-2 virus that causes COVID-19. Institutional protocols and algorithms that pertain to the evaluation of patients at  risk for COVID-19 are in a state of rapid change based on information released by regulatory bodies including the CDC and federal and state organizations. These policies and algorithms were followed during the patient's care in the ED.  Some ED evaluations and interventions may be delayed as a result of limited staffing during and the pandemic.*   Note:  This document was prepared using Dragon voice recognition software and may include unintentional dictation errors.   Jazsmin Couse, Delice Bison, DO 05/27/20 [phone]

## 2020-05-27 NOTE — Discharge Instructions (Addendum)
Please take your antibiotics until complete.  After you have finished your antibiotics, I recommend close follow-up with your gastroenterologist.   You are being provided a prescription for opiates (also known as narcotics) for pain control.  Opiates can be addictive and should only be used when absolutely necessary for pain control when other alternatives do not work.  We recommend you only use them for the recommended amount of time and only as prescribed.  Please do not take with other sedative medications or alcohol.  Please do not drive, operate machinery, make important decisions while taking opiates.  Please note that these medications can be addictive and have high abuse potential.  Patients can become addicted to narcotics after only taking them for a few days.  Please keep these medications locked away from children, teenagers or any family members with history of substance abuse.  Narcotic pain medicine may also make you constipated.  You may use over-the-counter medications such as MiraLAX, Colace to prevent constipation.  If you become constipated you may use over-the-counter enemas as needed.  Itching and nausea are common side effects of narcotic pain medication.  If you develop uncontrolled vomiting or a rash, please stop these medications.

## 2020-10-04 ENCOUNTER — Emergency Department: Payer: Medicare HMO

## 2020-10-04 ENCOUNTER — Encounter: Payer: Self-pay | Admitting: *Deleted

## 2020-10-04 ENCOUNTER — Emergency Department
Admission: EM | Admit: 2020-10-04 | Discharge: 2020-10-04 | Disposition: A | Discharge status: Home / Self Care | Payer: Medicare HMO | Attending: Emergency Medicine | Admitting: Emergency Medicine

## 2020-10-04 ENCOUNTER — Other Ambulatory Visit: Payer: Self-pay

## 2020-10-04 DIAGNOSIS — Z87891 Personal history of nicotine dependence: Secondary | ICD-10-CM | POA: Insufficient documentation

## 2020-10-04 DIAGNOSIS — R1032 Left lower quadrant pain: Secondary | ICD-10-CM | POA: Diagnosis present

## 2020-10-04 DIAGNOSIS — K5792 Diverticulitis of intestine, part unspecified, without perforation or abscess without bleeding: Secondary | ICD-10-CM | POA: Diagnosis not present

## 2020-10-04 LAB — URINALYSIS, COMPLETE (UACMP) WITH MICROSCOPIC
Bacteria, UA: NONE SEEN
Bilirubin Urine: NEGATIVE
Glucose, UA: NEGATIVE mg/dL
Hgb urine dipstick: NEGATIVE
Ketones, ur: NEGATIVE mg/dL
Nitrite: NEGATIVE
Protein, ur: NEGATIVE mg/dL
Specific Gravity, Urine: 1.011 (ref 1.005–1.030)
pH: 6 (ref 5.0–8.0)

## 2020-10-04 LAB — CBC WITH DIFFERENTIAL/PLATELET
Abs Immature Granulocytes: 0.02 10*3/uL (ref 0.00–0.07)
Basophils Absolute: 0 10*3/uL (ref 0.0–0.1)
Basophils Relative: 0 %
Eosinophils Absolute: 0.1 10*3/uL (ref 0.0–0.5)
Eosinophils Relative: 2 %
HCT: 37.8 % (ref 36.0–46.0)
Hemoglobin: 12.1 g/dL (ref 12.0–15.0)
Immature Granulocytes: 0 %
Lymphocytes Relative: 32 %
Lymphs Abs: 1.7 10*3/uL (ref 0.7–4.0)
MCH: 27.6 pg (ref 26.0–34.0)
MCHC: 32 g/dL (ref 30.0–36.0)
MCV: 86.3 fL (ref 80.0–100.0)
Monocytes Absolute: 0.4 10*3/uL (ref 0.1–1.0)
Monocytes Relative: 8 %
Neutro Abs: 3 10*3/uL (ref 1.7–7.7)
Neutrophils Relative %: 58 %
Platelets: 223 10*3/uL (ref 150–400)
RBC: 4.38 MIL/uL (ref 3.87–5.11)
RDW: 13.8 % (ref 11.5–15.5)
WBC: 5.2 10*3/uL (ref 4.0–10.5)
nRBC: 0 % (ref 0.0–0.2)

## 2020-10-04 LAB — COMPREHENSIVE METABOLIC PANEL
ALT: 34 U/L (ref 0–44)
AST: 26 U/L (ref 15–41)
Albumin: 4.1 g/dL (ref 3.5–5.0)
Alkaline Phosphatase: 57 U/L (ref 38–126)
Anion gap: 5 (ref 5–15)
BUN: 16 mg/dL (ref 8–23)
CO2: 30 mmol/L (ref 22–32)
Calcium: 9.8 mg/dL (ref 8.9–10.3)
Chloride: 105 mmol/L (ref 98–111)
Creatinine, Ser: 0.78 mg/dL (ref 0.44–1.00)
GFR, Estimated: >60 mL/min (ref >60)
Glucose, Bld: 95 mg/dL (ref 70–99)
Potassium: 3.9 mmol/L (ref 3.5–5.1)
Sodium: 140 mmol/L (ref 135–145)
Total Bilirubin: 0.7 mg/dL (ref 0.3–1.2)
Total Protein: 7.1 g/dL (ref 6.5–8.1)

## 2020-10-04 LAB — LIPASE, BLOOD: Lipase: 32 U/L (ref 11–51)

## 2020-10-04 MED ORDER — METRONIDAZOLE 500 MG PO TABS: 500.0000 mg | ORAL_TABLET | Freq: Three times a day (TID) | ORAL | 0 refills | Status: AC

## 2020-10-04 MED ORDER — HYDROCODONE-ACETAMINOPHEN 5-325 MG PO TABS: 1.0000 | ORAL_TABLET | Freq: Four times a day (QID) | ORAL | 0 refills | Status: DC | PRN

## 2020-10-04 MED ORDER — KETOROLAC TROMETHAMINE 30 MG/ML IJ SOLN
30.0000 mg | Freq: Once | INTRAMUSCULAR | Status: AC
  Administered 2020-10-04: 30 mg via INTRAMUSCULAR
  Filled 2020-10-04: qty 1

## 2020-10-04 MED ORDER — ONDANSETRON 4 MG PO TBDP: 4.0000 mg | ORAL_TABLET | Freq: Once | ORAL | Status: AC | PRN

## 2020-10-04 MED ORDER — ONDANSETRON 4 MG PO TBDP
ORAL_TABLET | ORAL | Status: AC
  Administered 2020-10-04: 4 mg via ORAL
  Filled 2020-10-04: qty 1

## 2020-10-04 MED ORDER — CIPROFLOXACIN HCL 500 MG PO TABS: 500.0000 mg | ORAL_TABLET | Freq: Two times a day (BID) | ORAL | 0 refills | Status: AC

## 2020-10-04 NOTE — ED Provider Notes (Signed)
Emergency Medicine Provider Triage Evaluation Note  Yolanda Barker , a 65 y.o. female  was evaluated in triage.  Pt complains of left lower quadrant abdominal pain, history of diverticulitis.  Review of Systems  Positive: Left lower quadrant abdominal pain Negative: Chest pain, shortness of breath  Physical Exam  There were no vitals taken for this visit. Gen:   Awake, mild distress   Resp:  Normal effort  MSK:   Moves extremities without difficulty  Other:  Abdomen tender to palpation left lower quadrant with voluntary guarding  Medical Decision Making  Medically screening exam initiated at 5:24 AM.  Appropriate orders placed.  Gerald E Curlin was informed that the remainder of the evaluation will be completed by another provider, this initial triage assessment does not replace that evaluation, and the importance of remaining in the ED until their evaluation is complete.  65 year old female presenting with left lower quadrant abdominal pain, history of diverticulitis.  Will obtain blood work, obtain CT abdomen/pelvis.  Patient awaiting treatment room.   Paulette Blanch, MD 10/04/20 [phone]

## 2020-10-04 NOTE — Discharge Instructions (Addendum)
Follow-up with your regular doctor as needed.  Follow-up with Dr. Marius Ditch if your diverticulitis continues to get worse.  Return emergency department if worsening.  Take medications as prescribed.

## 2020-10-04 NOTE — ED Provider Notes (Signed)
[facility] Emergency Department Provider Note  ____________________________________________   Event Date/Time   First MD Initiated Contact with Patient 10/04/20 [phone]     (approximate)  I have reviewed the triage vital signs and the nursing notes.   HISTORY  Chief Complaint Abdominal Pain (Pt c/o lower abdominal pain.)    HPI Yolanda Barker is a 65 y.o. female presents to the emergency department complaining of left lower quadrant pain.  Patient has history of diverticulitis.  States it feels the same.  She denies any fever, chills, nausea/vomiting/diarrhea.  Past Medical History:  Diagnosis Date   Anemia    Anxiety    Arthritis    Diverticulitis    Dyspnea    GERD (gastroesophageal reflux disease)    IDA (iron deficiency anemia)    Vertigo     There are no problems to display for this patient.   Past Surgical History:  Procedure Laterality Date   ABDOMINAL HYSTERECTOMY     COLONOSCOPY WITH PROPOFOL N/A 03/15/2016   Procedure: COLONOSCOPY WITH PROPOFOL;  Surgeon: Lollie Sails, MD;  Location: [facility] ENDOSCOPY;  Service: Endoscopy;  Laterality: N/A;   COLONOSCOPY WITH PROPOFOL N/A 11/15/2019   Procedure: COLONOSCOPY WITH PROPOFOL;  Surgeon: Lesly Rubenstein, MD;  Location: [facility] ENDOSCOPY;  Service: Gastroenterology;  Laterality: N/A;   SHOULDER ARTHROSCOPY WITH OPEN ROTATOR CUFF REPAIR Right 11/14/2017   Procedure: SHOULDER ARTHROSCOPY WITH OPEN ROTATOR CUFF REPAIR;  Surgeon: Corky Mull, MD;  Location: [facility] ORS;  Service: Orthopedics;  Laterality: Right;   SHOULDER CLOSED REDUCTION Right 03/13/2018   Procedure: CLOSED MANIPULATION SHOULDER;  Surgeon: Corky Mull, MD;  Location: [facility] ORS;  Service: Orthopedics;  Laterality: Right;   STERIOD INJECTION Right 03/13/2018   Procedure: STEROID INJECTION RIGHT SHOULDER;  Surgeon: Corky Mull, MD;  Location: [facility] ORS;  Service: Orthopedics;  Laterality: Right;   TONSILLECTOMY     WISDOM TOOTH  EXTRACTION      Prior to Admission medications   Medication Sig Start Date End Date Taking? Authorizing Provider  ciprofloxacin (CIPRO) 500 MG tablet Take 1 tablet (500 mg total) by mouth 2 (two) times daily for 10 days. 10/04/20 10/14/20 Yes Randle Shatzer, Linden Dolin, PA-C  HYDROcodone-acetaminophen (NORCO/VICODIN) 5-325 MG tablet Take 1 tablet by mouth every 6 (six) hours as needed for moderate pain. 10/04/20  Yes Eloise Mula, Linden Dolin, PA-C  metroNIDAZOLE (FLAGYL) 500 MG tablet Take 1 tablet (500 mg total) by mouth 3 (three) times daily for 7 days. 10/04/20 10/11/20 Yes Christiane Sistare, Linden Dolin, PA-C  acetaminophen (TYLENOL) 500 MG tablet Take 500 mg by mouth every 6 (six) hours as needed for moderate pain or headache.     [provider]  cetirizine (ZYRTEC) 10 MG tablet Take 10 mg by mouth daily.    [provider]  Cholecalciferol (VITAMIN D-1000 MAX ST) 1000 units tablet Take 1,000 Units by mouth daily.     [provider]  docusate sodium (COLACE) 100 MG capsule Take 1 tablet once or twice daily as needed for constipation 05/12/20   Hinda Kehr, MD  ferrous sulfate 325 (65 FE) MG tablet Take 325 mg by mouth daily.     [provider]  fexofenadine (ALLEGRA) 180 MG tablet Take 180 mg by mouth as needed.     [provider]  fluticasone (FLONASE ALLERGY RELIEF) 50 MCG/ACT nasal spray Place 2 sprays into both nostrils daily as needed for allergies.     [provider]  meclizine Johnathan Hausen)  25 MG tablet Take 25 mg by mouth 3 (three) times daily as needed for dizziness.     [provider]  omeprazole (PRILOSEC) 20 MG capsule Take 20 mg by mouth 2 (two) times daily as needed (for acid reflux).     [provider]  Propylene Glycol (SYSTANE BALANCE) 0.6 % SOLN Place 1 drop into both eyes daily as needed (for dry eyes).    [provider]    Allergies Penicillin g  Family History  Problem Relation Age of Onset   Bone cancer Mother    Stroke  Father    Breast cancer Neg Hx     Social History Social History   Tobacco Use   Smoking status: Former    Packs/day: 1.00    Years: 15.00    Pack years: 15.00    Types: Cigarettes    Quit date: 11/07/1997    Years since quitting: 22.9   Smokeless tobacco: Never  Vaping Use   Vaping Use: Never used  Substance Use Topics   Alcohol use: No   Drug use: No    Review of Systems  Constitutional: No fever/chills Eyes: No visual changes. ENT: No sore throat. Respiratory: Denies cough Cardiovascular: Denies chest pain Gastrointestinal: Positive abdominal pain Genitourinary: Negative for dysuria. Musculoskeletal: Negative for back pain. Skin: Negative for rash. Psychiatric: no mood changes,     ____________________________________________   PHYSICAL EXAM:  VITAL SIGNS: ED Triage Vitals  Enc Vitals Group     BP 10/04/20 0524 (!) 109/57     Pulse Rate 10/04/20 0524 69     Resp 10/04/20 0524 20     Temp 10/04/20 0524 98.3 F (36.8 C)     Temp Source 10/04/20 0524 Oral     SpO2 10/04/20 0524 98 %     Weight 10/04/20 0525 121 lb (54.9 kg)     Height 10/04/20 0525 '4\' 11"'$  (1.499 m)     Head Circumference --      Peak Flow --      Pain Score 10/04/20 0524 9     Pain Loc --      Pain Edu? --      Excl. in [place]? --     Constitutional: Alert and oriented. Well appearing and in no acute distress. Eyes: Conjunctivae are normal.  Head: Atraumatic. Nose: No congestion/rhinnorhea. Mouth/Throat: Mucous membranes are moist.   Neck:  supple no lymphadenopathy noted Cardiovascular: Normal rate, regular rhythm. Heart sounds are normal Respiratory: Normal respiratory effort.  No retractions, lungs c t a  Abd: soft tender in the left lower quadrant, bs normal all 4 quad GU: deferred Musculoskeletal: FROM all extremities, warm and well perfused Neurologic:  Normal speech and language.  Skin:  Skin is warm, dry and intact. No rash noted. Psychiatric: Mood and affect are normal.  Speech and behavior are normal.  ____________________________________________   LABS (all labs ordered are listed, but only abnormal results are displayed)  Labs Reviewed  URINALYSIS, COMPLETE (UACMP) WITH MICROSCOPIC - Abnormal; Notable for the following components:      Result Value   Color, Urine YELLOW (*)    APPearance CLEAR (*)    Leukocytes,Ua TRACE (*)    All other components within normal limits  CBC WITH DIFFERENTIAL/PLATELET  COMPREHENSIVE METABOLIC PANEL  LIPASE, BLOOD   ____________________________________________   ____________________________________________  RADIOLOGY  CT abdomen/pelvis  ____________________________________________   PROCEDURES  Procedure(s) performed: no  Procedures    ____________________________________________   INITIAL IMPRESSION / ASSESSMENT  AND PLAN / ED COURSE  Pertinent labs & imaging results that were available during my care of the patient were reviewed by me and considered in my medical decision making (see chart for details).   Patient is a 65 year old female presents emergency department with left lower quadrant pain.  See HPI.  Physical exam shows patient appears stable  DDx: Diverticulosis, diverticulitis, abscess, kidney stone, UTI  Labs are reassuring, CBC, metabolic panel, lipase, and UA are all normal.  CT abdomen/pelvis reviewed by me confirmed by radiology to show diverticulitis but no abscess  I did explain the findings to the patient.  She has driven herself here but does want something for pain.  Explained to her we cannot give her narcotic if she is going to drive herself home.  She states she understands.  She will be given Toradol IM.  She be given a prescription for Cipro, Flagyl, hydrocodone.  She is to follow-up with her regular doctor or a GI specialist if not improving in 2 to 3 days.  Return emergency department worsening.  She understands treatment plan.  She is discharged stable condition.      Yolanda Barker was evaluated in Emergency Department on 10/04/2020 for the symptoms described in the history of present illness. She was evaluated in the context of the global COVID-19 pandemic, which necessitated consideration that the patient might be at risk for infection with the SARS-CoV-2 virus that causes COVID-19. Institutional protocols and algorithms that pertain to the evaluation of patients at risk for COVID-19 are in a state of rapid change based on information released by regulatory bodies including the CDC and federal and state organizations. These policies and algorithms were followed during the patient's care in the ED.    As part of my medical decision making, I reviewed the following data within the [facility] notes reviewed and incorporated, Labs reviewed , Old chart reviewed, Radiograph reviewed , Notes from prior ED visits, and [place] Controlled Substance Database  ____________________________________________   FINAL CLINICAL IMPRESSION(S) / ED DIAGNOSES  Final diagnoses:  Acute diverticulitis      NEW MEDICATIONS STARTED DURING THIS VISIT:  New Prescriptions   CIPROFLOXACIN (CIPRO) 500 MG TABLET    Take 1 tablet (500 mg total) by mouth 2 (two) times daily for 10 days.   HYDROCODONE-ACETAMINOPHEN (NORCO/VICODIN) 5-325 MG TABLET    Take 1 tablet by mouth every 6 (six) hours as needed for moderate pain.   METRONIDAZOLE (FLAGYL) 500 MG TABLET    Take 1 tablet (500 mg total) by mouth 3 (three) times daily for 7 days.     Note:  This document was prepared using Dragon voice recognition software and may include unintentional dictation errors.    Versie Starks, PA-C 10/04/20 1025    Harvest Dark, MD 10/04/20 1401

## 2020-10-04 NOTE — ED Triage Notes (Signed)
Pt c/o lower abdominal pain. Pt w/ pmh of diverticulitis and states she feels as if it is the same. Pt denies n/v/d and fever.

## 2020-10-23 ENCOUNTER — Other Ambulatory Visit: Payer: Self-pay | Admitting: Family Medicine

## 2020-10-23 DIAGNOSIS — Z1231 Encounter for screening mammogram for malignant neoplasm of breast: Secondary | ICD-10-CM

## 2020-10-29 ENCOUNTER — Other Ambulatory Visit: Payer: Self-pay | Admitting: Family Medicine

## 2020-10-29 DIAGNOSIS — Z0000 Encounter for general adult medical examination without abnormal findings: Secondary | ICD-10-CM

## 2020-11-18 ENCOUNTER — Ambulatory Visit
Admission: RE | Admit: 2020-11-18 | Discharge: 2020-11-18 | Disposition: A | Discharge status: Home / Self Care | Payer: Medicare HMO | Source: Ambulatory Visit | Attending: Family Medicine | Admitting: Family Medicine

## 2020-11-18 ENCOUNTER — Other Ambulatory Visit: Payer: Self-pay

## 2020-11-18 DIAGNOSIS — Z780 Asymptomatic menopausal state: Secondary | ICD-10-CM | POA: Diagnosis not present

## 2020-11-18 DIAGNOSIS — M810 Age-related osteoporosis without current pathological fracture: Secondary | ICD-10-CM | POA: Diagnosis not present

## 2020-11-18 DIAGNOSIS — Z13820 Encounter for screening for osteoporosis: Secondary | ICD-10-CM | POA: Insufficient documentation

## 2020-11-18 DIAGNOSIS — Z0000 Encounter for general adult medical examination without abnormal findings: Secondary | ICD-10-CM

## 2020-12-29 ENCOUNTER — Encounter: Payer: Self-pay | Admitting: General Surgery

## 2021-04-07 ENCOUNTER — Other Ambulatory Visit: Payer: Self-pay

## 2021-04-07 ENCOUNTER — Ambulatory Visit
Admission: RE | Admit: 2021-04-07 | Discharge: 2021-04-07 | Disposition: A | Discharge status: Home / Self Care | Payer: Medicare HMO | Source: Ambulatory Visit | Attending: Family Medicine | Admitting: Family Medicine

## 2021-04-07 DIAGNOSIS — Z1231 Encounter for screening mammogram for malignant neoplasm of breast: Secondary | ICD-10-CM | POA: Diagnosis present

## 2021-05-03 ENCOUNTER — Encounter: Payer: Self-pay | Admitting: Emergency Medicine

## 2021-05-03 ENCOUNTER — Other Ambulatory Visit: Payer: Self-pay

## 2021-05-03 ENCOUNTER — Emergency Department: Payer: Medicare HMO

## 2021-05-03 DIAGNOSIS — M791 Myalgia, unspecified site: Secondary | ICD-10-CM | POA: Diagnosis not present

## 2021-05-03 DIAGNOSIS — M546 Pain in thoracic spine: Secondary | ICD-10-CM | POA: Diagnosis not present

## 2021-05-03 DIAGNOSIS — R197 Diarrhea, unspecified: Secondary | ICD-10-CM | POA: Diagnosis not present

## 2021-05-03 DIAGNOSIS — Z20822 Contact with and (suspected) exposure to covid-19: Secondary | ICD-10-CM | POA: Diagnosis not present

## 2021-05-03 LAB — COMPREHENSIVE METABOLIC PANEL
ALT: 37 U/L (ref 0–44)
AST: 27 U/L (ref 15–41)
Albumin: 3.7 g/dL (ref 3.5–5.0)
Alkaline Phosphatase: 52 U/L (ref 38–126)
Anion gap: 9 (ref 5–15)
BUN: 17 mg/dL (ref 8–23)
CO2: 27 mmol/L (ref 22–32)
Calcium: 9.2 mg/dL (ref 8.9–10.3)
Chloride: 105 mmol/L (ref 98–111)
Creatinine, Ser: 0.74 mg/dL (ref 0.44–1.00)
GFR, Estimated: >60 mL/min (ref >60)
Glucose, Bld: 104 mg/dL — ABNORMAL HIGH (ref 70–99)
Potassium: 3.7 mmol/L (ref 3.5–5.1)
Sodium: 141 mmol/L (ref 135–145)
Total Bilirubin: 0.5 mg/dL (ref 0.3–1.2)
Total Protein: 6.5 g/dL (ref 6.5–8.1)

## 2021-05-03 LAB — CBC WITH DIFFERENTIAL/PLATELET
Abs Immature Granulocytes: 0.01 10*3/uL (ref 0.00–0.07)
Basophils Absolute: 0 10*3/uL (ref 0.0–0.1)
Basophils Relative: 0 %
Eosinophils Absolute: 0 10*3/uL (ref 0.0–0.5)
Eosinophils Relative: 1 %
HCT: 37.7 % (ref 36.0–46.0)
Hemoglobin: 11.8 g/dL — ABNORMAL LOW (ref 12.0–15.0)
Immature Granulocytes: 0 %
Lymphocytes Relative: 31 %
Lymphs Abs: 0.9 10*3/uL (ref 0.7–4.0)
MCH: 26.9 pg (ref 26.0–34.0)
MCHC: 31.3 g/dL (ref 30.0–36.0)
MCV: 86.1 fL (ref 80.0–100.0)
Monocytes Absolute: 0.3 10*3/uL (ref 0.1–1.0)
Monocytes Relative: 10 %
Neutro Abs: 1.6 10*3/uL — ABNORMAL LOW (ref 1.7–7.7)
Neutrophils Relative %: 58 %
Platelets: 205 10*3/uL (ref 150–400)
RBC: 4.38 MIL/uL (ref 3.87–5.11)
RDW: 13.2 % (ref 11.5–15.5)
WBC: 2.9 10*3/uL — ABNORMAL LOW (ref 4.0–10.5)
nRBC: 0 % (ref 0.0–0.2)

## 2021-05-03 LAB — LIPASE, BLOOD: Lipase: 35 U/L (ref 11–51)

## 2021-05-03 LAB — TROPONIN I (HIGH SENSITIVITY)
Troponin I (High Sensitivity): 3 ng/L (ref <18)
Troponin I (High Sensitivity): 3 ng/L (ref <18)

## 2021-05-03 NOTE — ED Triage Notes (Signed)
Pt to ED from home c/o across upper back pain that comes and goes throughout the night.  Denies known injury.  States some nausea and diarrhea, denies abd pain or vomiting.  Erlene Quan, [place] in triage doing MSE.  Pt A&Ox4, chest rise even and unlabored, ambulatory steady gait, in NAD at this time.

## 2021-05-03 NOTE — ED Provider Triage Note (Signed)
°  Emergency Medicine Provider Triage Evaluation Note  Yolanda Barker , a 66 y.o.female,  was evaluated in triage.  Pt complains of back pain.  Patient states that she has been experiencing pain in her upper back, as well as the epigastric region.  Additionally endorses some nausea and diarrhea   Review of Systems  Positive: Nausea, diarrhea, upper back pain, epigastric pain Negative: Denies fever, chest pain, vomiting  Physical Exam   Vitals:   05/03/21 1942  BP: 124/63  Pulse: 66  Resp: 16  Temp: 97.7 F (36.5 C)  SpO2: 94%   Gen:   Awake, no distress   Resp:  Normal effort  MSK:   Moves extremities without difficulty  Other:  No abdominal tenderness.  Medical Decision Making  Given the patient's initial medical screening exam, the following diagnostic evaluation has been ordered. The patient will be placed in the appropriate treatment space, once one is available, to complete the evaluation and treatment. I have discussed the plan of care with the patient and I have advised the patient that an ED physician or mid-level practitioner will reevaluate their condition after the test results have been received, as the results may give them additional insight into the type of treatment they may need.    Diagnostics: Labs.  Treatments: none immediately   Teodoro Spray, [place] 05/03/21 1949

## 2021-05-04 ENCOUNTER — Emergency Department
Admission: EM | Admit: 2021-05-04 | Discharge: 2021-05-04 | Disposition: A | Discharge status: Home / Self Care | Payer: Medicare HMO | Attending: Emergency Medicine | Admitting: Emergency Medicine

## 2021-05-04 DIAGNOSIS — M791 Myalgia, unspecified site: Secondary | ICD-10-CM

## 2021-05-04 DIAGNOSIS — R197 Diarrhea, unspecified: Secondary | ICD-10-CM

## 2021-05-04 LAB — URINALYSIS, COMPLETE (UACMP) WITH MICROSCOPIC
Bacteria, UA: NONE SEEN
Bilirubin Urine: NEGATIVE
Glucose, UA: NEGATIVE mg/dL
Ketones, ur: NEGATIVE mg/dL
Nitrite: NEGATIVE
Protein, ur: NEGATIVE mg/dL
Specific Gravity, Urine: 1.008 (ref 1.005–1.030)
pH: 5 (ref 5.0–8.0)

## 2021-05-04 LAB — RESP PANEL BY RT-PCR (FLU A&B, COVID) ARPGX2
Influenza A by PCR: NEGATIVE
Influenza B by PCR: NEGATIVE
SARS Coronavirus 2 by RT PCR: NEGATIVE

## 2021-05-04 MED ORDER — ACETAMINOPHEN 500 MG PO TABS
1000.0000 mg | ORAL_TABLET | Freq: Once | ORAL | Status: AC
  Administered 2021-05-04: 1000 mg via ORAL
  Filled 2021-05-04: qty 2

## 2021-05-04 MED ORDER — IBUPROFEN 400 MG PO TABS: 400.0000 mg | ORAL_TABLET | Freq: Once | ORAL | Status: DC | PRN

## 2021-05-04 NOTE — ED Provider Notes (Signed)
[facility] Provider Note    Event Date/Time   First MD Initiated Contact with Patient 05/04/21 [phone]     (approximate)   History   Back Pain   HPI  Yolanda Barker is a 66 y.o. female with a past medical history of anemia, anxiety, arthritis, diverticulitis, and GERD who presents for evaluation of some upper back soreness that she states started 2 or 3 days ago associate with some nonbloody diarrhea.  No fevers, cough, shortness of breath, chest pain, headache, earache, sore throat, vomiting, diarrhea, urinary symptoms or any other clear associated sick symptoms.  She does not recall any heavy lifting injuries or other clear precipitating factors.  No prior similar episodes.  No analgesia prior to arrival.    Past Medical History:  Diagnosis Date   Anemia    Anxiety    Arthritis    Diverticulitis    Dyspnea    GERD (gastroesophageal reflux disease)    IDA (iron deficiency anemia)    Vertigo      Physical Exam  Triage Vital Signs: ED Triage Vitals  Enc Vitals Group     BP 05/03/21 1942 124/63     Pulse Rate 05/03/21 1942 66     Resp 05/03/21 1942 16     Temp 05/03/21 1942 97.7 F (36.5 C)     Temp Source 05/03/21 1942 Oral     SpO2 05/03/21 1942 94 %     Weight 05/03/21 1941 122 lb (55.3 kg)     Height 05/03/21 1941 4\' 11"  (1.499 m)     Head Circumference --      Peak Flow --      Pain Score 05/03/21 1939 9     Pain Loc --      Pain Edu? --      Excl. in [place]? --     Most recent vital signs: Vitals:   05/03/21 2305 05/04/21 0227  BP: 139/72 110/65  Pulse: (!) 59 (!) 56  Resp: 16 20  Temp:  98 F (36.7 C)  SpO2: 100% 100%    General: Awake, no distress.  CV:  Good peripheral perfusion.  2+ radial pulses.  Slightly bradycardic. Resp:  Normal effort.  Clear bilaterally. Abd:  No distention.  Soft throughout. Other:  2+ radial pulses.  Patient has full strength and range of motion to all the bilateral upper extremities.  No overlying  skin changes of the back or any midline tenderness.  Some mild tenderness over the bilateral thoracic muscles.   ED Results / Procedures / Treatments  Labs (all labs ordered are listed, but only abnormal results are displayed) Labs Reviewed  CBC WITH DIFFERENTIAL/PLATELET - Abnormal; Notable for the following components:      Result Value   WBC 2.9 (*)    Hemoglobin 11.8 (*)    Neutro Abs 1.6 (*)    All other components within normal limits  COMPREHENSIVE METABOLIC PANEL - Abnormal; Notable for the following components:   Glucose, Bld 104 (*)    All other components within normal limits  URINALYSIS, COMPLETE (UACMP) WITH MICROSCOPIC - Abnormal; Notable for the following components:   Color, Urine STRAW (*)    APPearance CLEAR (*)    Hgb urine dipstick SMALL (*)    Leukocytes,Ua TRACE (*)    All other components within normal limits  RESP PANEL BY RT-PCR (FLU A&B, COVID) ARPGX2  LIPASE, BLOOD  TROPONIN I (HIGH SENSITIVITY)  TROPONIN I (HIGH SENSITIVITY)  EKG  ECG is remarkable for sinus bradycardia with a ventricular rate of 49, normal axis, unremarkable intervals without evidence of acute ischemia or significant arrhythmia.   RADIOLOGY Chest reviewed by myself shows no focal consoidation, effusion, edema, pneumothorax or other clear acute thoracic process. I also reviewed radiology interpretation and agree with findings described.    PROCEDURES:  Critical Care performed: No  Procedures    MEDICATIONS ORDERED IN ED: Medications  ibuprofen (ADVIL) tablet 400 mg (has no administration in time range)  acetaminophen (TYLENOL) tablet 1,000 mg (1,000 mg Oral Given 05/04/21 0227)     IMPRESSION / MDM / ASSESSMENT AND PLAN / ED COURSE  I reviewed the triage vital signs and the nursing notes.                              Differential diagnosis includes, but is not limited to atypical ACS, pneumonia, pneumothorax, MSK and myalgias associate with acute viral syndrome.   Overall very low suspicion for PE given patient has an SPO2 of 100% without any evidence of respiratory distress and patient denying any chest pain or shortness of breath or clear risk factors at this time.  In addition I have very low suspicion for dissection given absence again of any chest pain or shortness of breath heart rate of 56 with patient not on any calcium channel blockers or beta-blockers and associated diarrhea which are much more suggestive at this time of infectious process.  Chest reviewed by myself shows no focal consoidation, effusion, edema, pneumothorax or other clear acute thoracic process. I also reviewed radiology interpretation and agree with findings described.  ECG is remarkable for sinus bradycardia with a ventricular rate of 49, normal axis, unremarkable intervals without evidence of acute ischemia or significant arrhythmia.  Nonelevated troponin x2 is not suggestive of ACS or myocarditis.  Lipase not consistent with pancreatitis.  UA not suggestive of active cholecystitis.  CMP without any significant lecture metabolic derangements.  CBC shows WBC count of 2.9 hemoglobin 11.8 not suggestive of acute symptomatic anemia.  Patient is able to ambulate around emergency room without difficulty.  On reassessment she states she is feeling much better.  Consider observation and further diagnostic studies, given stable vitals otherwise.  Reassuring exam work-up, suspicion for immediately for any process I think she is stable for discharge with outpatient follow-up.  Discussed that she can follow-up her COVID influenza test online.  Discussed returning for any new or worsening of symptoms.  Discharged stable condition.  Strict return precautions advised and discussed.      FINAL CLINICAL IMPRESSION(S) / ED DIAGNOSES   Final diagnoses:  Myalgia  Diarrhea of presumed infectious origin     Rx / DC Orders   ED Discharge Orders     None        Note:  This document was  prepared using Dragon voice recognition software and may include unintentional dictation errors.   Lucrezia Starch, MD 05/04/21 [phone]

## 2021-07-24 ENCOUNTER — Encounter: Payer: Self-pay | Admitting: Emergency Medicine

## 2021-07-24 ENCOUNTER — Emergency Department
Admission: EM | Admit: 2021-07-24 | Discharge: 2021-07-24 | Disposition: A | Discharge status: Home / Self Care | Payer: Medicare HMO | Attending: Emergency Medicine | Admitting: Emergency Medicine

## 2021-07-24 ENCOUNTER — Other Ambulatory Visit: Payer: Self-pay

## 2021-07-24 DIAGNOSIS — J0140 Acute pansinusitis, unspecified: Secondary | ICD-10-CM | POA: Diagnosis not present

## 2021-07-24 DIAGNOSIS — R519 Headache, unspecified: Secondary | ICD-10-CM | POA: Diagnosis present

## 2021-07-24 MED ORDER — AZITHROMYCIN 250 MG PO TABS: ORAL_TABLET | ORAL | 0 refills | Status: AC

## 2021-07-24 NOTE — ED Notes (Signed)
See triage note  presents with some body aches cough and chills  unsure of fever at home but is currently afebrile on arrival   ?

## 2021-07-24 NOTE — ED Triage Notes (Signed)
Pt reports headache, bodyaches, productive cough, sore throat and chills this week. Pt reports has been taking muccinex but hasn't had much relief. Pt denies recent exposures. Pt denies fevers ?

## 2021-07-24 NOTE — Discharge Instructions (Signed)
You were seen today for URI symptoms.  You have been diagnosed with a sinus infection.  I have put you on an antibiotic that I want you to take as prescribed for the next 5 days.  Please continue Claritin and Flonase OTC.  You may also try a Nettie pot (sinus rinse) which can be purchased from your local pharmacy.  You may take Ibuprofen 400 to 600 mg every 8 hours as needed for headache with food.  Please follow-up with your PCP if symptoms persist. ?

## 2021-07-24 NOTE — ED Provider Notes (Signed)
? ?[facility] ?Provider Note ? ? ? Event Date/Time  ? First MD Initiated Contact with Patient 07/24/21 [phone]   ?  (approximate) ? ? ?History  ? ?Headache, Chills, Generalized Body Aches, and Cough ? ? ?HPI ? ?Yolanda Barker is a 66 y.o. female presents to the ER today with complaint of headache, facial pain and pressure, nasal congestion, scratchy throat and cough.  She reports this started 1 week ago.  The headache is located in her forehead and the top of her head.  She describes the pain as throbbing.  She denies dizziness, vision changes, sensitivity to light or sound, nausea or vomiting.  She is blowing yellow mucus out of her nose.  She denies difficulty swallowing.  The cough is productive of yellow mucus.  She denies runny nose, ear pain, shortness of breath, chest pain.  She denies fever, chills or vomiting.  She has tried Claritin, Flonase and Mucinex OTC with minimal relief of symptoms.  She has not had sick contacts that she is aware of.  She does not smoke. ? ?  ? ? ?Physical Exam  ? ?Triage Vital Signs: ?ED Triage Vitals  ?Enc Vitals Group  ?   BP 07/24/21 0719 125/64  ?   Pulse Rate 07/24/21 0719 92  ?   Resp 07/24/21 0719 19  ?   Temp 07/24/21 0719 98.3 ?F (36.8 ?C)  ?   Temp src --   ?   SpO2 07/24/21 0719 96 %  ?   Weight 07/24/21 0717 121 lb 4.1 oz (55 kg)  ?   Height 07/24/21 0717 '4\' 11"'$  (1.499 m)  ?   Head Circumference --   ?   Peak Flow --   ?   Pain Score 07/24/21 0716 6  ?   Pain Loc --   ?   Pain Edu? --   ?   Excl. in [place]? --   ? ? ?Most recent vital signs: ?Vitals:  ? 07/24/21 0719  ?BP: 125/64  ?Pulse: 92  ?Resp: 19  ?Temp: 98.3 ?F (36.8 ?C)  ?SpO2: 96%  ? ? ? ?General: Awake, no distress.  ?ENT:  Frontal and maxillary sinus pressure noted.  Nasal mucosa dry, turbinates swollen, no nasal discharge noted.  Posterior pharynx pink and moist, + PND.  No tonsillar erythema or exudate noted. ?Nodes:  No cervical adenopathy noted. ?CV:  RRR. ?Resp:  CTA bilaterally. ? ? ? ?ED  Results / Procedures / Treatments  ? ?MEDICATIONS ORDERED IN ED: ?Medications - No data to display ? ? ?IMPRESSION / MDM / ASSESSMENT AND PLAN / ED COURSE  ?I reviewed the triage vital signs and the nursing notes. ? ?Acute headache, facial pain and pressure, nasal congestion, scratchy throat and cough: ? ?Differential diagnosis includes, but is not limited to, viral sinusitis, bacterial sinusitis, viral URI with cough, allergic rhinitis ? ?Exam consistent with bacterial sinusitis ?No indication to check for flu/COVID/RSV given duration of symptoms and the likelihood that it will not change the treatment plan ?Rx for Azithromycin 250 mg p.o. x5 days ?Offered Toradol injection today for headache but she declines ?Advised her to continue Claritin and Flonase OTC ?Can take Ibuprofen 400- 600 mg every 8 hours as needed for headache (kidney function reviewed) ?She will follow-up with her PCP if symptoms persist or worsen ? ? ? ? ?FINAL CLINICAL IMPRESSION(S) / ED DIAGNOSES  ? ?Final diagnoses:  ?Acute non-recurrent pansinusitis  ? ? ? ?Rx / DC Orders  ? ?  ED Discharge Orders   ? ?      Ordered  ?  azithromycin (ZITHROMAX Z-PAK) 250 MG tablet       ? 07/24/21 0734  ? ?  ?  ? ?  ? ? ? ?Note:  This document was prepared using Dragon voice recognition software and may include unintentional dictation errors. ? ?  ?Jearld Fenton, NP ?07/24/21 0736 ? ?  ?Blake Divine, MD ?07/24/21 [phone] ? ?

## 2022-02-28 ENCOUNTER — Other Ambulatory Visit: Payer: Self-pay | Admitting: Family Medicine

## 2022-02-28 DIAGNOSIS — Z1231 Encounter for screening mammogram for malignant neoplasm of breast: Secondary | ICD-10-CM

## 2022-03-30 ENCOUNTER — Other Ambulatory Visit: Payer: Self-pay

## 2022-03-30 ENCOUNTER — Emergency Department
Admission: EM | Admit: 2022-03-30 | Discharge: 2022-03-30 | Disposition: A | Discharge status: Home / Self Care | Payer: Medicare PPO | Attending: Emergency Medicine | Admitting: Emergency Medicine

## 2022-03-30 ENCOUNTER — Emergency Department: Payer: Medicare PPO

## 2022-03-30 DIAGNOSIS — E876 Hypokalemia: Secondary | ICD-10-CM | POA: Diagnosis not present

## 2022-03-30 DIAGNOSIS — K5792 Diverticulitis of intestine, part unspecified, without perforation or abscess without bleeding: Secondary | ICD-10-CM | POA: Insufficient documentation

## 2022-03-30 DIAGNOSIS — R109 Unspecified abdominal pain: Secondary | ICD-10-CM | POA: Diagnosis present

## 2022-03-30 LAB — COMPREHENSIVE METABOLIC PANEL
ALT: 23 U/L (ref 0–44)
AST: 20 U/L (ref 15–41)
Albumin: 3.8 g/dL (ref 3.5–5.0)
Alkaline Phosphatase: 55 U/L (ref 38–126)
Anion gap: 7 (ref 5–15)
BUN: 16 mg/dL (ref 8–23)
CO2: 26 mmol/L (ref 22–32)
Calcium: 9.1 mg/dL (ref 8.9–10.3)
Chloride: 107 mmol/L (ref 98–111)
Creatinine, Ser: 0.75 mg/dL (ref 0.44–1.00)
GFR, Estimated: >60 mL/min (ref >60)
Glucose, Bld: 81 mg/dL (ref 70–99)
Potassium: 3.4 mmol/L — ABNORMAL LOW (ref 3.5–5.1)
Sodium: 140 mmol/L (ref 135–145)
Total Bilirubin: 0.6 mg/dL (ref 0.3–1.2)
Total Protein: 6.6 g/dL (ref 6.5–8.1)

## 2022-03-30 LAB — URINALYSIS, ROUTINE W REFLEX MICROSCOPIC
Bilirubin Urine: NEGATIVE
Glucose, UA: NEGATIVE mg/dL
Ketones, ur: NEGATIVE mg/dL
Nitrite: NEGATIVE
Protein, ur: NEGATIVE mg/dL
Specific Gravity, Urine: 1.016 (ref 1.005–1.030)
pH: 5 (ref 5.0–8.0)

## 2022-03-30 LAB — LIPASE, BLOOD: Lipase: 38 U/L (ref 11–51)

## 2022-03-30 LAB — CBC
HCT: 37.3 % (ref 36.0–46.0)
Hemoglobin: 11.5 g/dL — ABNORMAL LOW (ref 12.0–15.0)
MCH: 27.3 pg (ref 26.0–34.0)
MCHC: 30.8 g/dL (ref 30.0–36.0)
MCV: 88.4 fL (ref 80.0–100.0)
Platelets: 190 10*3/uL (ref 150–400)
RBC: 4.22 MIL/uL (ref 3.87–5.11)
RDW: 13.2 % (ref 11.5–15.5)
WBC: 4.2 10*3/uL (ref 4.0–10.5)
nRBC: 0 % (ref 0.0–0.2)

## 2022-03-30 MED ORDER — CIPROFLOXACIN HCL 500 MG PO TABS: 500.0000 mg | ORAL_TABLET | Freq: Two times a day (BID) | ORAL | 0 refills | Status: AC

## 2022-03-30 MED ORDER — LACTATED RINGERS IV BOLUS
1000.0000 mL | Freq: Once | INTRAVENOUS | Status: AC
  Administered 2022-03-30: 1000 mL via INTRAVENOUS

## 2022-03-30 MED ORDER — METRONIDAZOLE 500 MG PO TABS: 500.0000 mg | ORAL_TABLET | Freq: Three times a day (TID) | ORAL | 0 refills | Status: AC

## 2022-03-30 MED ORDER — IOHEXOL 300 MG/ML  SOLN
100.0000 mL | Freq: Once | INTRAMUSCULAR | Status: AC | PRN
  Administered 2022-03-30: 100 mL via INTRAVENOUS

## 2022-03-30 MED ORDER — POTASSIUM CHLORIDE CRYS ER 20 MEQ PO TBCR
20.0000 meq | EXTENDED_RELEASE_TABLET | Freq: Once | ORAL | Status: AC
  Administered 2022-03-30: 20 meq via ORAL
  Filled 2022-03-30: qty 1

## 2022-03-30 NOTE — ED Triage Notes (Signed)
Pt comes with c/o left lower belly pain for few days. Pt states it has gotten worse. Pt stats soreness. Pt states hx of diverticulitis.  Pt denies any N/V.

## 2022-03-30 NOTE — ED Notes (Signed)
See triage note. Pt here for lower abd pain. Denies N/V/D. Hx of diverticulitis.

## 2022-03-30 NOTE — ED Provider Notes (Signed)
[facility] Provider Note    Event Date/Time   First MD Initiated Contact with Patient 03/30/22 [phone]     (approximate)   History   Chief Complaint Abdominal Pain   HPI  Yolanda Barker is a 67 y.o. female with past medical history of arthritis, iron deficiency anemia, and GERD who presents to the ED complaining of abdominal pain.  Patient reports that she has had constant sharp pain in the left lower quadrant of her abdomen for the past 24 hours.  She states that pain is relatively mild at rest but the area is extremely sore to touch.  She has not had any nausea or vomiting and denies any changes in her bowel movements.  She denies any fevers, dysuria, or hematuria.  She describes symptoms as similar to when she has dealt with diverticulitis in the past.     Physical Exam   Triage Vital Signs: ED Triage Vitals  Enc Vitals Group     BP 03/30/22 0711 (!) 124/57     Pulse Rate 03/30/22 0711 61     Resp 03/30/22 0711 16     Temp 03/30/22 0711 98 F (36.7 C)     Temp src --      SpO2 03/30/22 0711 97 %     Weight 03/30/22 0712 126 lb (57.2 kg)     Height 03/30/22 0712 '4\' 11"'$  (1.499 m)     Head Circumference --      Peak Flow --      Pain Score 03/30/22 0717 3     Pain Loc --      Pain Edu? --      Excl. in [place]? --     Most recent vital signs: Vitals:   03/30/22 0711  BP: (!) 124/57  Pulse: 61  Resp: 16  Temp: 98 F (36.7 C)  SpO2: 97%    Constitutional: Alert and oriented. Eyes: Conjunctivae are normal. Head: Atraumatic. Nose: No congestion/rhinnorhea. Mouth/Throat: Mucous membranes are moist.  Cardiovascular: Normal rate, regular rhythm. Grossly normal heart sounds.  2+ radial pulses bilaterally. Respiratory: Normal respiratory effort.  No retractions. Lungs CTAB. Gastrointestinal: Soft and tender to palpation in the left lower quadrant with voluntary guarding.  No distention noted. Musculoskeletal: No lower extremity tenderness nor  edema.  Neurologic:  Normal speech and language. No gross focal neurologic deficits are appreciated.    ED Results / Procedures / Treatments   Labs (all labs ordered are listed, but only abnormal results are displayed) Labs Reviewed  COMPREHENSIVE METABOLIC PANEL - Abnormal; Notable for the following components:      Result Value   Potassium 3.4 (*)    All other components within normal limits  CBC - Abnormal; Notable for the following components:   Hemoglobin 11.5 (*)    All other components within normal limits  URINALYSIS, ROUTINE W REFLEX MICROSCOPIC - Abnormal; Notable for the following components:   Color, Urine YELLOW (*)    APPearance HAZY (*)    Hgb urine dipstick SMALL (*)    Leukocytes,Ua SMALL (*)    Bacteria, UA RARE (*)    All other components within normal limits  LIPASE, BLOOD   RADIOLOGY CT imaging reviewed and interpreted by me with inflammatory changes in the left lower quadrant consistent with diverticulitis, no focal fluid collection noted.  PROCEDURES:  Critical Care performed: No  Procedures   MEDICATIONS ORDERED IN ED: Medications  potassium chloride SA (KLOR-CON M) CR tablet 20 mEq (  has no administration in time range)  lactated ringers bolus 1,000 mL (1,000 mLs Intravenous New Bag/Given 03/30/22 0759)  iohexol (OMNIPAQUE) 300 MG/ML solution 100 mL (100 mLs Intravenous Contrast Given 03/30/22 0824)     IMPRESSION / MDM / ASSESSMENT AND PLAN / ED COURSE  I reviewed the triage vital signs and the nursing notes.                              67 y.o. female with past medical history of iron deficiency anemia, arthritis, and GERD who presents to the ED complaining of constant left lower quadrant abdominal pain for the past 24 hours.  Patient's presentation is most consistent with acute presentation with potential threat to life or bodily function.  Differential diagnosis includes, but is not limited to, diverticulitis, bowel perforation, UTI,  kidney stone, bowel obstruction, electrolyte abnormality, AKI, anemia.  Patient well-appearing and in no acute distress, vital signs are unremarkable.  She does have focal tenderness in the left lower quadrant of her abdomen with voluntary guarding, will further assess with CT scan for diverticulitis, potential complication, or other explanation for her pain.  Patient declines pain medication, labs and urine sample are pending at this time.  CT imaging consistent with acute uncomplicated diverticulitis.  Labs are reassuring with only mild hypokalemia, no significant anemia, leukocytosis, electrolyte abnormality, or AKI noted.  LFTs and lipase are unremarkable, urinalysis shows no signs of infection.  Patient does have nonspecific bowel wall thickening, which she was informed of and counseled to follow-up for colonoscopy.  We will start her on Cipro and Flagyl given her penicillin allergy, patient counseled to return to the ED for new or worsening symptoms, patient agrees with plan.      FINAL CLINICAL IMPRESSION(S) / ED DIAGNOSES   Final diagnoses:  Diverticulitis     Rx / DC Orders   ED Discharge Orders          Ordered    ciprofloxacin (CIPRO) 500 MG tablet  2 times daily        03/30/22 0847    metroNIDAZOLE (FLAGYL) 500 MG tablet  3 times daily        03/30/22 0847             Note:  This document was prepared using Dragon voice recognition software and may include unintentional dictation errors.   Blake Divine, MD 03/30/22 [phone]

## 2022-04-08 ENCOUNTER — Ambulatory Visit
Admission: RE | Admit: 2022-04-08 | Discharge: 2022-04-08 | Disposition: A | Discharge status: Home / Self Care | Payer: Medicare PPO | Source: Ambulatory Visit | Attending: Family Medicine | Admitting: Family Medicine

## 2022-04-08 DIAGNOSIS — Z1231 Encounter for screening mammogram for malignant neoplasm of breast: Secondary | ICD-10-CM | POA: Insufficient documentation

## 2022-06-01 ENCOUNTER — Encounter: Payer: Self-pay | Admitting: *Deleted

## 2022-06-01 ENCOUNTER — Other Ambulatory Visit: Payer: Self-pay

## 2022-06-01 ENCOUNTER — Emergency Department: Payer: Medicare PPO

## 2022-06-01 ENCOUNTER — Emergency Department
Admission: EM | Admit: 2022-06-01 | Discharge: 2022-06-01 | Disposition: A | Discharge status: Home / Self Care | Payer: Medicare PPO | Attending: Emergency Medicine | Admitting: Emergency Medicine

## 2022-06-01 DIAGNOSIS — U071 COVID-19: Secondary | ICD-10-CM | POA: Diagnosis not present

## 2022-06-01 DIAGNOSIS — R059 Cough, unspecified: Secondary | ICD-10-CM | POA: Diagnosis present

## 2022-06-01 LAB — RESP PANEL BY RT-PCR (RSV, FLU A&B, COVID)  RVPGX2
Influenza A by PCR: NEGATIVE
Influenza B by PCR: NEGATIVE
Resp Syncytial Virus by PCR: NEGATIVE
SARS Coronavirus 2 by RT PCR: POSITIVE — AB

## 2022-06-01 LAB — GROUP A STREP BY PCR: Group A Strep by PCR: NOT DETECTED

## 2022-06-01 MED ORDER — METHYLPREDNISOLONE 4 MG PO TBPK
ORAL_TABLET | ORAL | 0 refills | Status: DC
Start: 2022-06-01 — End: 2023-11-08

## 2022-06-01 MED ORDER — BENZONATATE 200 MG PO CAPS: 200.0000 mg | ORAL_CAPSULE | Freq: Three times a day (TID) | ORAL | 0 refills | Status: DC | PRN

## 2022-06-01 MED ORDER — PREDNISONE 20 MG PO TABS
30.0000 mg | ORAL_TABLET | Freq: Once | ORAL | Status: AC
  Administered 2022-06-01: 30 mg via ORAL
  Filled 2022-06-01: qty 1

## 2022-06-01 MED ORDER — ALBUTEROL SULFATE HFA 108 (90 BASE) MCG/ACT IN AERS: 2.0000 | INHALATION_SPRAY | RESPIRATORY_TRACT | 0 refills | Status: DC | PRN

## 2022-06-01 NOTE — ED Triage Notes (Signed)
Pt reports a cough since this am. Pt reports a sore throat since yesterday.  No fever.  Pt alert  speech clear.

## 2022-06-01 NOTE — ED Provider Notes (Signed)
[facility] Provider Note    Event Date/Time   First MD Initiated Contact with Patient 06/01/22 0211     (approximate)   History   Cough and Sore Throat   HPI  Yolanda Barker is a 67 y.o. female who presents to the ED from home with a 3-day history of dry cough, sore throat and nasal congestion.  Denies fever/chills, chest pain, shortness of breath, abdominal pain, nausea, vomiting or dizziness.     Past Medical History   Past Medical History:  Diagnosis Date   Anemia    Anxiety    Arthritis    Diverticulitis    Dyspnea    GERD (gastroesophageal reflux disease)    IDA (iron deficiency anemia)    Vertigo      Active Problem List  There are no problems to display for this patient.    Past Surgical History   Past Surgical History:  Procedure Laterality Date   ABDOMINAL HYSTERECTOMY     COLONOSCOPY WITH PROPOFOL N/A 03/15/2016   Procedure: COLONOSCOPY WITH PROPOFOL;  Surgeon: Lollie Sails, MD;  Location: [facility] ENDOSCOPY;  Service: Endoscopy;  Laterality: N/A;   COLONOSCOPY WITH PROPOFOL N/A 11/15/2019   Procedure: COLONOSCOPY WITH PROPOFOL;  Surgeon: Lesly Rubenstein, MD;  Location: [facility] ENDOSCOPY;  Service: Gastroenterology;  Laterality: N/A;   SHOULDER ARTHROSCOPY WITH OPEN ROTATOR CUFF REPAIR Right 11/14/2017   Procedure: SHOULDER ARTHROSCOPY WITH OPEN ROTATOR CUFF REPAIR;  Surgeon: Corky Mull, MD;  Location: [facility] ORS;  Service: Orthopedics;  Laterality: Right;   SHOULDER CLOSED REDUCTION Right 03/13/2018   Procedure: CLOSED MANIPULATION SHOULDER;  Surgeon: Corky Mull, MD;  Location: [facility] ORS;  Service: Orthopedics;  Laterality: Right;   STERIOD INJECTION Right 03/13/2018   Procedure: STEROID INJECTION RIGHT SHOULDER;  Surgeon: Corky Mull, MD;  Location: [facility] ORS;  Service: Orthopedics;  Laterality: Right;   TONSILLECTOMY     WISDOM TOOTH EXTRACTION       Home Medications   Prior to Admission medications   Medication Sig  Start Date End Date Taking? Authorizing Provider  albuterol (VENTOLIN HFA) 108 (90 Base) MCG/ACT inhaler Inhale 2 puffs into the lungs every 4 (four) hours as needed for wheezing or shortness of breath. 06/01/22  Yes Paulette Blanch, MD  benzonatate (TESSALON) 200 MG capsule Take 1 capsule (200 mg total) by mouth 3 (three) times daily as needed for cough. 06/01/22  Yes Paulette Blanch, MD  methylPREDNISolone (MEDROL DOSEPAK) 4 MG TBPK tablet Take as directed 06/01/22  Yes Paulette Blanch, MD  acetaminophen (TYLENOL) 500 MG tablet Take 500 mg by mouth every 6 (six) hours as needed for moderate pain or headache.     [provider]  cetirizine (ZYRTEC) 10 MG tablet Take 10 mg by mouth daily.    [provider]  Cholecalciferol (VITAMIN D-1000 MAX ST) 1000 units tablet Take 1,000 Units by mouth daily.     [provider]  docusate sodium (COLACE) 100 MG capsule Take 1 tablet once or twice daily as needed for constipation 05/12/20   Hinda Kehr, MD  ferrous sulfate 325 (65 FE) MG tablet Take 325 mg by mouth daily.     [provider]  fexofenadine (ALLEGRA) 180 MG tablet Take 180 mg by mouth as needed.     [provider]  fluticasone (FLONASE ALLERGY RELIEF) 50 MCG/ACT nasal spray Place 2 sprays into both nostrils daily as needed for allergies.  [provider]  meclizine (ANTIVERT) 25 MG tablet Take 25 mg by mouth 3 (three) times daily as needed for dizziness.     [provider]  omeprazole (PRILOSEC) 20 MG capsule Take 20 mg by mouth 2 (two) times daily as needed (for acid reflux).     [provider]  Propylene Glycol (SYSTANE BALANCE) 0.6 % SOLN Place 1 drop into both eyes daily as needed (for dry eyes).    [provider]     Allergies  Penicillin g   Family History   Family History  Problem Relation Age of Onset   Bone cancer Mother    Stroke Father    Breast cancer Neg Hx      Physical Exam  Triage Vital  Signs: ED Triage Vitals  Enc Vitals Group     BP 06/01/22 0108 (!) 156/92     Pulse Rate 06/01/22 0108 68     Resp 06/01/22 0108 18     Temp 06/01/22 0108 98.1 F (36.7 C)     Temp Source 06/01/22 0108 Oral     SpO2 06/01/22 0108 97 %     Weight 06/01/22 0109 126 lb 1.7 oz (57.2 kg)     Height 06/01/22 0109 4\' 11"  (1.499 m)     Head Circumference --      Peak Flow --      Pain Score 06/01/22 0119 0     Pain Loc --      Pain Edu? --      Excl. in [place]? --     Updated Vital Signs: BP (!) 156/92 (BP Location: Left Arm)   Pulse 68   Temp 98.1 F (36.7 C) (Oral)   Resp 18   Ht 4\' 11"  (1.499 m)   Wt 57 kg   SpO2 97%   BMI 25.38 kg/m    General: Awake, no distress.  CV:  RRR.  Good peripheral perfusion.  Resp:  Normal effort.  CTAB. Abd:  Nontender.  No distention.  Other:  Oropharynx dull without tonsillar swelling, exudates or peritonsillar abscess.  There is no hoarse or muffled voice.  There is no drooling.  No cervical adenopathy.   ED Results / Procedures / Treatments  Labs (all labs ordered are listed, but only abnormal results are displayed) Labs Reviewed  RESP PANEL BY RT-PCR (RSV, FLU A&B, COVID)  [id number] - Abnormal; Notable for the following components:      Result Value   SARS Coronavirus 2 by RT PCR POSITIVE (*)    All other components within normal limits  GROUP A STREP BY PCR     EKG  None   RADIOLOGY I have independently visualized and interpreted patient's x-ray as well as noted the radiology interpretation:  X-ray: No acute cardiopulmonary process  Official radiology report(s): DG Chest 2 View  Result Date: 06/01/2022 CLINICAL DATA:  Cough EXAM: CHEST - 2 VIEW COMPARISON:  05/03/2021 FINDINGS: The heart size and mediastinal contours are within normal limits. Both lungs are clear. The visualized skeletal structures are unremarkable. IMPRESSION: No active cardiopulmonary disease. Electronically Signed   By: Rolm Baptise M.D.   On: 06/01/2022  02:47     PROCEDURES:  Critical Care performed: No  Procedures   MEDICATIONS ORDERED IN ED: Medications  predniSONE (DELTASONE) tablet 30 mg (has no administration in time range)     IMPRESSION / MDM / ASSESSMENT AND PLAN / ED COURSE  I reviewed the triage vital signs and the nursing  notes.                             67 year old female presenting with cold-like symptoms.  She is positive for COVID-19.  Chest x-ray is clear without evidence of pneumonia.  Patient has been vaccinated previously for COVID-19.  We discussed risk/benefits of starting Paxlovid; patient declines.  Will treat with Medrol Dosepak, as needed Tessalon for cough and Albuterol inhaler as needed.  Strict return precautions given.  Patient verbalizes understanding and agrees with plan of care.  Patient's presentation is most consistent with acute, uncomplicated illness.   FINAL CLINICAL IMPRESSION(S) / ED DIAGNOSES   Final diagnoses:  COVID-19     Rx / DC Orders   ED Discharge Orders          Ordered    methylPREDNISolone (MEDROL DOSEPAK) 4 MG TBPK tablet        06/01/22 0242    benzonatate (TESSALON) 200 MG capsule  3 times daily PRN        06/01/22 0243    albuterol (VENTOLIN HFA) 108 (90 Base) MCG/ACT inhaler  Every 4 hours PRN        06/01/22 0244             Note:  This document was prepared using Dragon voice recognition software and may include unintentional dictation errors.   Paulette Blanch, MD 06/01/22 [phone]

## 2022-06-01 NOTE — Discharge Instructions (Signed)
Take steroid taper as prescribed. You may take cough medicine as needed. You may use Albuterol inhaler 2 puffs every 4 hours as needed for cough/wheezing/difficulty breathing.   Return to the ER for worsening symptoms, persistent vomiting, difficulty breathing or other concerns.

## 2022-07-19 ENCOUNTER — Encounter: Payer: Self-pay | Admitting: Gastroenterology

## 2022-07-25 ENCOUNTER — Encounter: Payer: Self-pay | Admitting: Gastroenterology

## 2022-07-26 ENCOUNTER — Other Ambulatory Visit: Payer: Self-pay

## 2022-07-26 ENCOUNTER — Encounter
Admission: RE | Disposition: A | Discharge status: Home / Self Care | Payer: Self-pay | Source: Home / Self Care | Attending: Gastroenterology

## 2022-07-26 ENCOUNTER — Ambulatory Visit: Payer: Medicare PPO | Admitting: Anesthesiology

## 2022-07-26 ENCOUNTER — Ambulatory Visit
Admission: RE | Admit: 2022-07-26 | Discharge: 2022-07-26 | Disposition: A | Discharge status: Home / Self Care | Payer: Medicare PPO | Attending: Gastroenterology | Admitting: Gastroenterology

## 2022-07-26 ENCOUNTER — Encounter: Payer: Self-pay | Admitting: Gastroenterology

## 2022-07-26 DIAGNOSIS — E119 Type 2 diabetes mellitus without complications: Secondary | ICD-10-CM | POA: Insufficient documentation

## 2022-07-26 DIAGNOSIS — M1990 Unspecified osteoarthritis, unspecified site: Secondary | ICD-10-CM | POA: Insufficient documentation

## 2022-07-26 DIAGNOSIS — Z87891 Personal history of nicotine dependence: Secondary | ICD-10-CM | POA: Diagnosis not present

## 2022-07-26 DIAGNOSIS — R0602 Shortness of breath: Secondary | ICD-10-CM | POA: Diagnosis not present

## 2022-07-26 DIAGNOSIS — K219 Gastro-esophageal reflux disease without esophagitis: Secondary | ICD-10-CM | POA: Insufficient documentation

## 2022-07-26 DIAGNOSIS — F419 Anxiety disorder, unspecified: Secondary | ICD-10-CM | POA: Diagnosis not present

## 2022-07-26 DIAGNOSIS — K5730 Diverticulosis of large intestine without perforation or abscess without bleeding: Secondary | ICD-10-CM | POA: Insufficient documentation

## 2022-07-26 DIAGNOSIS — D649 Anemia, unspecified: Secondary | ICD-10-CM | POA: Diagnosis not present

## 2022-07-26 DIAGNOSIS — R933 Abnormal findings on diagnostic imaging of other parts of digestive tract: Secondary | ICD-10-CM | POA: Diagnosis present

## 2022-07-26 DIAGNOSIS — J45909 Unspecified asthma, uncomplicated: Secondary | ICD-10-CM | POA: Diagnosis not present

## 2022-07-26 DIAGNOSIS — K640 First degree hemorrhoids: Secondary | ICD-10-CM | POA: Diagnosis not present

## 2022-07-26 HISTORY — DX: Type 2 diabetes mellitus without complications: E11.9

## 2022-07-26 HISTORY — PX: COLONOSCOPY WITH PROPOFOL: SHX5780

## 2022-07-26 HISTORY — DX: Unspecified injury of muscle, fascia and tendon of long head of biceps, right arm, initial encounter: S46.101A

## 2022-07-26 HISTORY — DX: Adhesive capsulitis of right shoulder: M75.01

## 2022-07-26 HISTORY — DX: Other shoulder lesions, right shoulder: M75.81

## 2022-07-26 HISTORY — DX: Incomplete rotator cuff tear or rupture of right shoulder, not specified as traumatic: M75.111

## 2022-07-26 LAB — GLUCOSE, CAPILLARY: Glucose-Capillary: 72 mg/dL (ref 70–99)

## 2022-07-26 SURGERY — COLONOSCOPY WITH PROPOFOL
Anesthesia: General

## 2022-07-26 MED ORDER — FENTANYL CITRATE (PF) 100 MCG/2ML IJ SOLN
INTRAMUSCULAR | Status: DC | PRN
  Administered 2022-07-26 (×2): 12.5 ug via INTRAVENOUS

## 2022-07-26 MED ORDER — PROPOFOL 500 MG/50ML IV EMUL
INTRAVENOUS | Status: DC | PRN
  Administered 2022-07-26: 75 ug/kg/min via INTRAVENOUS

## 2022-07-26 MED ORDER — FENTANYL CITRATE (PF) 100 MCG/2ML IJ SOLN
INTRAMUSCULAR | Status: AC
  Filled 2022-07-26: qty 2

## 2022-07-26 MED ORDER — LIDOCAINE HCL (PF) 2 % IJ SOLN
INTRAMUSCULAR | Status: AC
  Filled 2022-07-26: qty 5

## 2022-07-26 MED ORDER — SODIUM CHLORIDE 0.9 % IV SOLN: INTRAVENOUS | Status: DC

## 2022-07-26 MED ORDER — STERILE WATER FOR IRRIGATION IR SOLN
Status: DC | PRN
  Administered 2022-07-26: 60 mL

## 2022-07-26 MED ORDER — PHENYLEPHRINE 80 MCG/ML (10ML) SYRINGE FOR IV PUSH (FOR BLOOD PRESSURE SUPPORT)
PREFILLED_SYRINGE | INTRAVENOUS | Status: AC
  Filled 2022-07-26: qty 10

## 2022-07-26 MED ORDER — ARTIFICIAL TEARS OPHTHALMIC OINT
TOPICAL_OINTMENT | OPHTHALMIC | Status: AC
  Filled 2022-07-26: qty 3.5

## 2022-07-26 MED ORDER — PROPOFOL 10 MG/ML IV BOLUS
INTRAVENOUS | Status: DC | PRN
  Administered 2022-07-26: 50 mg via INTRAVENOUS

## 2022-07-26 MED ORDER — DEXMEDETOMIDINE HCL IN NACL 400 MCG/100ML IV SOLN
INTRAVENOUS | Status: DC | PRN
  Administered 2022-07-26: 12 ug via INTRAVENOUS
  Administered 2022-07-26: 8 ug via INTRAVENOUS

## 2022-07-26 MED ORDER — LIDOCAINE HCL (CARDIAC) PF 100 MG/5ML IV SOSY
PREFILLED_SYRINGE | INTRAVENOUS | Status: DC | PRN
  Administered 2022-07-26: 60 mg via INTRAVENOUS

## 2022-07-26 NOTE — H&P (Signed)
Outpatient short stay form Pre-procedure 07/26/2022  Yolanda Bill, MD  Primary Physician: [facility]  Reason for visit:  History of diverticulitis  History of present illness:    67 y/o lady with history of diverticulitis here for colonoscopy. Last colonoscopy in 2021 with small TA. No blood thinners. No significant abdominal surgeries. Nof amily history of GI malignancies.    Current Facility-Administered Medications:    0.9 %  sodium chloride infusion, , Intravenous, Continuous, Evander Macaraeg, Rossie Muskrat, MD  Medications Prior to Admission  Medication Sig Dispense Refill Last Dose   ALPRAZolam (XANAX) 1 MG tablet Take 1 mg by mouth at bedtime as needed for anxiety.      cetirizine (ZYRTEC) 10 MG tablet Take 10 mg by mouth daily.   Past Week   Cholecalciferol (VITAMIN D-1000 MAX ST) 1000 units tablet Take 1,000 Units by mouth daily.    Past Week   docusate calcium (SURFAK) 240 MG capsule Take 240 mg by mouth daily.   Past Week   docusate sodium (COLACE) 100 MG capsule Take 1 tablet once or twice daily as needed for constipation 30 capsule 0 Past Week   ferrous sulfate 325 (65 FE) MG tablet Take 325 mg by mouth daily.    Past Week   fexofenadine (ALLEGRA) 180 MG tablet Take 180 mg by mouth as needed.    Past Week   fluticasone (FLONASE ALLERGY RELIEF) 50 MCG/ACT nasal spray Place 2 sprays into both nostrils daily as needed for allergies.    Past Week   gabapentin (NEURONTIN) 300 MG capsule Take 300 mg by mouth 3 (three) times daily.   Past Week   ibuprofen (ADVIL) 200 MG tablet Take 200 mg by mouth every 6 (six) hours as needed.   Past Week   meclizine (ANTIVERT) 25 MG tablet Take 25 mg by mouth 3 (three) times daily as needed for dizziness.    Past Week   methocarbamol (ROBAXIN) 500 MG tablet Take 500 mg by mouth 4 (four) times daily.   Past Week   methylPREDNISolone (MEDROL DOSEPAK) 4 MG TBPK tablet Take as directed 21 tablet 0 Past Week   omeprazole  (PRILOSEC) 20 MG capsule Take 20 mg by mouth 2 (two) times daily as needed (for acid reflux).    Past Week   polyethylene glycol (MIRALAX / GLYCOLAX) 17 g packet Take 17 g by mouth daily.   Past Week   Propylene Glycol (SYSTANE BALANCE) 0.6 % SOLN Place 1 drop into both eyes daily as needed (for dry eyes).   Past Week   acetaminophen (TYLENOL) 500 MG tablet Take 500 mg by mouth every 6 (six) hours as needed for moderate pain or headache.       albuterol (VENTOLIN HFA) 108 (90 Base) MCG/ACT inhaler Inhale 2 puffs into the lungs every 4 (four) hours as needed for wheezing or shortness of breath. 18 g 0    benzonatate (TESSALON) 200 MG capsule Take 1 capsule (200 mg total) by mouth 3 (three) times daily as needed for cough. 20 capsule 0      Allergies  Allergen Reactions   Penicillin G Rash and Other (See Comments)    Has patient had a PCN reaction causing immediate rash, facial/tongue/throat swelling, SOB or lightheadedness with hypotension: Unknown Has patient had a PCN reaction causing severe rash involving mucus membranes or skin necrosis: Unknown Has patient had a PCN reaction that required hospitalization: No Has patient had a PCN reaction occurring within the last  10 years: No If all of the above answers are "NO", then may proceed with Cephalosporin use.      Past Medical History:  Diagnosis Date   Adhesive capsulitis of right shoulder    Anemia    Anxiety    Arthritis    Diabetes mellitus without complication (HCC)    Diverticulitis    Dyspnea    GERD (gastroesophageal reflux disease)    IDA (iron deficiency anemia)    Injury of tendon of [place] head of right biceps    Nontraumatic incomplete tear of right rotator cuff    Rotator cuff tendinitis, right    Vertigo     Review of systems:  Otherwise negative.    Physical Exam  Gen: Alert, oriented. Appears stated age.  HEENT: PERRLA. Lungs: No respiratory distress CV: RRR Abd: soft, benign, no masses Ext: No  edema    Planned procedures: Proceed with colonoscopy. The patient understands the nature of the planned procedure, indications, risks, alternatives and potential complications including but not limited to bleeding, infection, perforation, damage to internal organs and possible oversedation/side effects from anesthesia. The patient agrees and gives consent to proceed.  Please refer to procedure notes for findings, recommendations and patient disposition/instructions.     Yolanda Bill, MD [facility]

## 2022-07-26 NOTE — Interval H&P Note (Signed)
History and Physical Interval Note:  07/26/2022 12:13 PM  Yolanda Barker  has presented today for surgery, with the diagnosis of LLQ  PAIN,ABNORMAL CT SCAN.  The various methods of treatment have been discussed with the patient and family. After consideration of risks, benefits and other options for treatment, the patient has consented to  Procedure(s): COLONOSCOPY WITH PROPOFOL (N/A) as a surgical intervention.  The patient's history has been reviewed, patient examined, no change in status, stable for surgery.  I have reviewed the patient's chart and labs.  Questions were answered to the patient's satisfaction.     Regis Bill  Ok to proceed with colonoscopy

## 2022-07-26 NOTE — Anesthesia Postprocedure Evaluation (Signed)
Anesthesia Post Note  Patient: Yolanda Barker  Procedure(s) Performed: COLONOSCOPY WITH PROPOFOL  Patient location during evaluation: Endoscopy Anesthesia Type: General Level of consciousness: awake and alert Pain management: pain level controlled Vital Signs Assessment: post-procedure vital signs reviewed and stable Respiratory status: spontaneous breathing, nonlabored ventilation, respiratory function stable and patient connected to nasal cannula oxygen Cardiovascular status: blood pressure returned to baseline and stable Postop Assessment: no apparent nausea or vomiting Anesthetic complications: no   No notable events documented.   Last Vitals:  Vitals:   07/26/22 1255 07/26/22 1305  BP: (!) 103/57 (!) 111/59  Pulse: (!) 55 (!) 57  Resp: 15 17  Temp: (!) 36.3 C   SpO2: 100% 100%    Last Pain:  Vitals:   07/26/22 1305  TempSrc:   PainSc: 0-No pain                 Louie Boston

## 2022-07-26 NOTE — Anesthesia Preprocedure Evaluation (Addendum)
Anesthesia Evaluation  Patient identified by MRN, date of birth, ID band Patient awake    Reviewed: Allergy & Precautions, NPO status , Patient's Chart, lab work & pertinent test results  History of Anesthesia Complications Negative for: history of anesthetic complications  Airway Mallampati: III  TM Distance: >3 FB Neck ROM: full    Dental  (+) Chipped   Pulmonary neg pulmonary ROS, shortness of breath and with exertion, asthma , neg sleep apnea, neg COPD, Patient abstained from smoking.Not current smoker, former smoker   Pulmonary exam normal        Cardiovascular Exercise Tolerance: Good METS(-) hypertension(-) CAD and (-) Past MI negative cardio ROS Normal cardiovascular exam(-) dysrhythmias      Neuro/Psych  PSYCHIATRIC DISORDERS Anxiety     negative neurological ROS  negative psych ROS   GI/Hepatic Neg liver ROS,GERD  ,,  Endo/Other  diabetes    Renal/GU negative Renal ROS  negative genitourinary   Musculoskeletal  (+) Arthritis ,    Abdominal   Peds  Hematology  (+) Blood dyscrasia, anemia   Anesthesia Other Findings Past Medical History: No date: Adhesive capsulitis of right shoulder No date: Anemia No date: Anxiety No date: Arthritis No date: Diabetes mellitus without complication (HCC) No date: Diverticulitis No date: Dyspnea No date: GERD (gastroesophageal reflux disease) No date: IDA (iron deficiency anemia) No date: Injury of tendon of Rod head of right biceps No date: Nontraumatic incomplete tear of right rotator cuff No date: Rotator cuff tendinitis, right No date: Vertigo  Past Surgical History: No date: ABDOMINAL HYSTERECTOMY 03/15/2016: COLONOSCOPY WITH PROPOFOL; N/A     Comment:  Procedure: COLONOSCOPY WITH PROPOFOL;  Surgeon: Christena Deem, MD;  Location: [facility] ENDOSCOPY;  Service:               Endoscopy;  Laterality: N/A; 11/15/2019: COLONOSCOPY WITH PROPOFOL; N/A      Comment:  Procedure: COLONOSCOPY WITH PROPOFOL;  Surgeon:               Regis Bill, MD;  Location: [facility] ENDOSCOPY;                Service: Gastroenterology;  Laterality: N/A; 11/14/2017: SHOULDER ARTHROSCOPY WITH OPEN ROTATOR CUFF REPAIR; Right     Comment:  Procedure: SHOULDER ARTHROSCOPY WITH OPEN ROTATOR CUFF               REPAIR;  Surgeon: Christena Flake, MD;  Location: [facility] ORS;              Service: Orthopedics;  Laterality: Right; 03/13/2018: SHOULDER CLOSED REDUCTION; Right     Comment:  Procedure: CLOSED MANIPULATION SHOULDER;  Surgeon:               Christena Flake, MD;  Location: [facility] ORS;  Service:               Orthopedics;  Laterality: Right; 03/13/2018: STERIOD INJECTION; Right     Comment:  Procedure: STEROID INJECTION RIGHT SHOULDER;  Surgeon:               Christena Flake, MD;  Location: [facility] ORS;  Service:               Orthopedics;  Laterality: Right; No date: TONSILLECTOMY No date: WISDOM TOOTH EXTRACTION     Reproductive/Obstetrics negative OB ROS  Anesthesia Physical Anesthesia Plan  ASA: 2  Anesthesia Plan: General   Post-op Pain Management:    Induction: Intravenous  PONV Risk Score and Plan: 3 and Propofol infusion and TIVA  Airway Management Planned: Natural Airway  Additional Equipment: None  Intra-op Plan:   Post-operative Plan:   Informed Consent: I have reviewed the patients History and Physical, chart, labs and discussed the procedure including the risks, benefits and alternatives for the proposed anesthesia with the patient or authorized representative who has indicated his/her understanding and acceptance.     Dental Advisory Given  Plan Discussed with: Anesthesiologist, CRNA and Surgeon  Anesthesia Plan Comments: (Discussed risks of anesthesia with patient, including PONV, sore throat, lip/dental/eye damage. Rare risks discussed as well, such as cardiorespiratory and  neurological sequelae, and allergic reactions. Discussed the role of CRNA in patient's perioperative care. Patient understands.)        Anesthesia Quick Evaluation

## 2022-07-26 NOTE — Transfer of Care (Signed)
Immediate Anesthesia Transfer of Care Note  Patient: Yolanda Barker  Procedure(s) Performed: COLONOSCOPY WITH PROPOFOL  Patient Location: PACU  Anesthesia Type:General  Level of Consciousness: sedated  Airway & Oxygen Therapy: Patient Spontanous Breathing  Post-op Assessment: Report given to RN and Post -op Vital signs reviewed and stable  Post vital signs: Reviewed and stable  Last Vitals:  Vitals Value Taken Time  BP 103/57 07/26/22 1255  Temp 36.3 C 07/26/22 1255  Pulse 56 07/26/22 1256  Resp 15 07/26/22 1256  SpO2 100 % 07/26/22 1256  Vitals shown include unvalidated device data.  Last Pain:  Vitals:   07/26/22 1255  TempSrc: Temporal  PainSc: Asleep         Complications: No notable events documented.

## 2022-07-26 NOTE — Op Note (Signed)
[facility] Patient Name: Yolanda Barker Procedure Date: 07/26/2022 12:00 PM MRN: [id number] Account #: [id number] Date of Birth: 02-05-1956 Admit Type: Outpatient Age: 67 Room: [facility] ENDO ROOM 1 Gender: Female Note Status: Finalized Instrument Name: Prentice Docker [id number] Procedure:             Colonoscopy Indications:           Abnormal CT of the GI tract Providers:             Eather Colas MD, MD Referring MD:          Bonnita Nasuti [facility], dr (Referring MD) Medicines:             Monitored Anesthesia Care Complications:         No immediate complications. Procedure:             Pre-Anesthesia Assessment:                        - Prior to the procedure, a History and Physical was                         performed, and patient medications and allergies were                         reviewed. The patient is competent. The risks and                         benefits of the procedure and the sedation options and                         risks were discussed with the patient. All questions                         were answered and informed consent was obtained.                         Patient identification and proposed procedure were                         verified by the physician, the nurse, the                         anesthesiologist, the anesthetist and the technician                         in the endoscopy suite. Mental Status Examination:                         alert and oriented. Airway Examination: normal                         oropharyngeal airway and neck mobility. Respiratory                         Examination: clear to auscultation. CV Examination:                         normal. Prophylactic Antibiotics: The patient does not  require prophylactic antibiotics. Prior                         Anticoagulants: The patient has taken no anticoagulant                         or antiplatelet agents. ASA Grade  Assessment: II - A                         patient with mild systemic disease. After reviewing                         the risks and benefits, the patient was deemed in                         satisfactory condition to undergo the procedure. The                         anesthesia plan was to use monitored anesthesia care                         (MAC). Immediately prior to administration of                         medications, the patient was re-assessed for adequacy                         to receive sedatives. The heart rate, respiratory                         rate, oxygen saturations, blood pressure, adequacy of                         pulmonary ventilation, and response to care were                         monitored throughout the procedure. The physical                         status of the patient was re-assessed after the                         procedure.                        After obtaining informed consent, the colonoscope was                         passed under direct vision. Throughout the procedure,                         the patient's blood pressure, pulse, and oxygen                         saturations were monitored continuously. The                         Colonoscope was introduced through the anus and  advanced to the the cecum, identified by appendiceal                         orifice and ileocecal valve. The colonoscopy was                         technically difficult and complex due to restricted                         mobility of the colon, significant looping and a                         tortuous colon. Successful completion of the procedure                         was aided by changing the patient to a prone position,                         using manual pressure and withdrawing the scope and                         replacing with the pediatric colonoscope. The patient                         tolerated the procedure well. The quality of  the bowel                         preparation was good. The ileocecal valve, appendiceal                         orifice, and rectum were photographed. Findings:      The perianal and digital rectal examinations were normal.      Many small-mouthed diverticula were found in the sigmoid colon and       descending colon.      Internal hemorrhoids were found during retroflexion. The hemorrhoids       were Grade I (internal hemorrhoids that do not prolapse).      The exam was otherwise without abnormality on direct and retroflexion       views. Impression:            - Diverticulosis in the sigmoid colon and in the                         descending colon.                        - Internal hemorrhoids.                        - The examination was otherwise normal on direct and                         retroflexion views.                        - No specimens collected. Recommendation:        - Discharge patient to home.                        -  Resume previous diet.                        - Continue present medications.                        - Repeat colonoscopy in 5 years for surveillance.                        - Return to referring physician as previously                         scheduled. Procedure Code(s):     --- Professional ---                        [phone], Colonoscopy, flexible; diagnostic, including                         collection of specimen(s) by brushing or washing, when                         performed (separate procedure) Diagnosis Code(s):     --- Professional ---                        K64.0, First degree hemorrhoids                        K57.30, Diverticulosis of large intestine without                         perforation or abscess without bleeding                        R93.3, Abnormal findings on diagnostic imaging of                         other parts of digestive tract CPT copyright 2022 [employer]. All rights reserved. The codes documented  in this report are preliminary and upon coder review may  be revised to meet current compliance requirements. Eather Colas MD, MD 07/26/2022 12:57:08 PM Number of Addenda: 0 Note Initiated On: 07/26/2022 12:00 PM Scope Withdrawal Time: 0 hours 4 minutes 22 seconds  Total Procedure Duration: 0 hours 30 minutes 54 seconds  Estimated Blood Loss:  Estimated blood loss: none.      [facility]

## 2022-07-27 ENCOUNTER — Encounter: Payer: Self-pay | Admitting: Gastroenterology

## 2023-01-31 ENCOUNTER — Encounter: Payer: Self-pay | Admitting: Emergency Medicine

## 2023-01-31 ENCOUNTER — Other Ambulatory Visit: Payer: Self-pay

## 2023-01-31 ENCOUNTER — Emergency Department
Admission: EM | Admit: 2023-01-31 | Discharge: 2023-01-31 | Disposition: A | Discharge status: Home / Self Care | Payer: Medicare PPO | Attending: Emergency Medicine | Admitting: Emergency Medicine

## 2023-01-31 ENCOUNTER — Emergency Department: Payer: Medicare PPO

## 2023-01-31 DIAGNOSIS — E119 Type 2 diabetes mellitus without complications: Secondary | ICD-10-CM | POA: Diagnosis not present

## 2023-01-31 DIAGNOSIS — K5732 Diverticulitis of large intestine without perforation or abscess without bleeding: Secondary | ICD-10-CM | POA: Diagnosis not present

## 2023-01-31 DIAGNOSIS — R1032 Left lower quadrant pain: Secondary | ICD-10-CM | POA: Diagnosis present

## 2023-01-31 DIAGNOSIS — K5792 Diverticulitis of intestine, part unspecified, without perforation or abscess without bleeding: Secondary | ICD-10-CM

## 2023-01-31 HISTORY — DX: Essential (primary) hypertension: I10

## 2023-01-31 LAB — URINALYSIS, ROUTINE W REFLEX MICROSCOPIC
Bacteria, UA: NONE SEEN
Bilirubin Urine: NEGATIVE
Glucose, UA: NEGATIVE mg/dL
Ketones, ur: NEGATIVE mg/dL
Leukocytes,Ua: NEGATIVE
Nitrite: NEGATIVE
Protein, ur: NEGATIVE mg/dL
Specific Gravity, Urine: 1.015 (ref 1.005–1.030)
pH: 5 (ref 5.0–8.0)

## 2023-01-31 LAB — COMPREHENSIVE METABOLIC PANEL
ALT: 25 U/L (ref 0–44)
AST: 22 U/L (ref 15–41)
Albumin: 3.9 g/dL (ref 3.5–5.0)
Alkaline Phosphatase: 57 U/L (ref 38–126)
Anion gap: 7 (ref 5–15)
BUN: 17 mg/dL (ref 8–23)
CO2: 28 mmol/L (ref 22–32)
Calcium: 9.2 mg/dL (ref 8.9–10.3)
Chloride: 105 mmol/L (ref 98–111)
Creatinine, Ser: 0.79 mg/dL (ref 0.44–1.00)
GFR, Estimated: >60 mL/min (ref >60)
Glucose, Bld: 82 mg/dL (ref 70–99)
Potassium: 3.5 mmol/L (ref 3.5–5.1)
Sodium: 140 mmol/L (ref 135–145)
Total Bilirubin: 0.5 mg/dL (ref <1.2)
Total Protein: 6.8 g/dL (ref 6.5–8.1)

## 2023-01-31 LAB — CBC
HCT: 36.3 % (ref 36.0–46.0)
Hemoglobin: 11.6 g/dL — ABNORMAL LOW (ref 12.0–15.0)
MCH: 28 pg (ref 26.0–34.0)
MCHC: 32 g/dL (ref 30.0–36.0)
MCV: 87.5 fL (ref 80.0–100.0)
Platelets: 204 10*3/uL (ref 150–400)
RBC: 4.15 MIL/uL (ref 3.87–5.11)
RDW: 13.4 % (ref 11.5–15.5)
WBC: 5.7 10*3/uL (ref 4.0–10.5)
nRBC: 0 % (ref 0.0–0.2)

## 2023-01-31 LAB — LIPASE, BLOOD: Lipase: 29 U/L (ref 11–51)

## 2023-01-31 MED ORDER — METRONIDAZOLE 500 MG PO TABS: 500.0000 mg | ORAL_TABLET | Freq: Three times a day (TID) | ORAL | 0 refills | Status: AC

## 2023-01-31 MED ORDER — CIPROFLOXACIN HCL 500 MG PO TABS: 500.0000 mg | ORAL_TABLET | Freq: Two times a day (BID) | ORAL | 0 refills | Status: AC

## 2023-01-31 MED ORDER — IOHEXOL 300 MG/ML  SOLN
100.0000 mL | Freq: Once | INTRAMUSCULAR | Status: AC | PRN
  Administered 2023-01-31: 100 mL via INTRAVENOUS

## 2023-01-31 NOTE — Discharge Instructions (Addendum)
Please pick up your antibiotics from the pharmacy and take as prescribed.  Please follow-up with your regular doctor within 1 week.  Please return for any worsening symptoms.

## 2023-01-31 NOTE — ED Provider Notes (Signed)
[facility] Provider Note    Event Date/Time   First MD Initiated Contact with Patient 01/31/23 [phone]     (approximate)   History   Flank Pain (/)   HPI Yolanda Barker is a 67 y.o. female with history of DM2, diverticulitis presenting today for abdominal pain.  Patient states onset of left lower quadrant abdominal pain yesterday afternoon.  Pain symptoms have been coming and going.  Felt similar to when she had diverticulitis in the past.  Denies nausea, vomiting, fever, chills, chest pain, shortness of breath, diarrhea, dysuria, hematuria.  States last bowel movement was 2 days ago.  No prior abdominal surgeries.  Chart review: Patient had episode of diverticulitis in January 2024 with similar symptoms.  Also had follow-up colonoscopy with GI in May 2024.     Physical Exam   Triage Vital Signs: ED Triage Vitals  Encounter Vitals Group     BP 01/31/23 0722 (!) 140/86     Systolic BP Percentile --      Diastolic BP Percentile --      Pulse Rate 01/31/23 0722 73     Resp 01/31/23 0722 16     Temp 01/31/23 0722 98 F (36.7 C)     Temp Source 01/31/23 0722 Oral     SpO2 01/31/23 0722 100 %     Weight 01/31/23 0721 132 lb 15 oz (60.3 kg)     Height 01/31/23 0721 4\' 11"  (1.499 m)     Head Circumference --      Peak Flow --      Pain Score --      Pain Loc --      Pain Education --      Exclude from Growth Chart --     Most recent vital signs: Vitals:   01/31/23 0722 01/31/23 1130  BP: (!) 140/86 138/80  Pulse: 73 76  Resp: 16 16  Temp: 98 F (36.7 C) 98.2 F (36.8 C)  SpO2: 100% 100%   Physical Exam: I have reviewed the vital signs and nursing notes. General: Awake, alert, no acute distress.  Nontoxic appearing. Head:  Atraumatic, normocephalic.   ENT:  EOM intact, PERRL. Oral mucosa is pink and moist with no lesions. Neck: Neck is supple with full range of motion, No meningeal signs. Cardiovascular:  RRR, No murmurs. Peripheral pulses  palpable and equal bilaterally. Respiratory:  Symmetrical chest wall expansion.  No rhonchi, rales, or wheezes.  Good air movement throughout.  No use of accessory muscles.   Musculoskeletal:  No cyanosis or edema. Moving extremities with full ROM Abdomen:  Soft, mild tenderness palpation in left lower quadrant, nondistended.  No CVA tenderness bilaterally. Neuro:  GCS 15, moving all four extremities, interacting appropriately. Speech clear. Psych:  Calm, appropriate.   Skin:  Warm, dry, no rash.    ED Results / Procedures / Treatments   Labs (all labs ordered are listed, but only abnormal results are displayed) Labs Reviewed  CBC - Abnormal; Notable for the following components:      Result Value   Hemoglobin 11.6 (*)    All other components within normal limits  URINALYSIS, ROUTINE W REFLEX MICROSCOPIC - Abnormal; Notable for the following components:   Color, Urine YELLOW (*)    APPearance CLEAR (*)    Hgb urine dipstick SMALL (*)    All other components within normal limits  LIPASE, BLOOD  COMPREHENSIVE METABOLIC PANEL     EKG    RADIOLOGY  Independently interpreted CT showing evidence of diverticulitis without complication   PROCEDURES:  Critical Care performed: No  Procedures   MEDICATIONS ORDERED IN ED: Medications  iohexol (OMNIPAQUE) 300 MG/ML solution 100 mL (100 mLs Intravenous Contrast Given 01/31/23 0847)     IMPRESSION / MDM / ASSESSMENT AND PLAN / ED COURSE  I reviewed the triage vital signs and the nursing notes.                              Differential diagnosis includes, but is not limited to, diverticulitis, constipation, bowel obstruction  Patient's presentation is most consistent with acute complicated illness / injury requiring diagnostic workup.  Patient is a 67 year old female presenting today for 1 day of left lower quadrant abdominal pain with no associated symptoms.  Highest concern is for diverticulitis given prior history of this  and similar presentation.  Patient declines pain medicine in the ED.  Will get CT abdomen/pelvis to evaluate for further pathology.  CT abdomen/pelvis shows evidence of diverticulitis without complication.  Laboratory workup otherwise reassuring.  Patient stable for discharge and will send out on ciprofloxacin and Flagyl given penicillin allergy.  Told to follow-up with PCP and given strict return precautions.  Clinical Course as of 01/31/23 1237  Tue Jan 31, 2023  1233 CT ABDOMEN PELVIS W CONTRAST Evidence of diverticulitis without complication [DW]    Clinical Course User Index [DW] Janith Lima, MD     FINAL CLINICAL IMPRESSION(S) / ED DIAGNOSES   Final diagnoses:  Diverticulitis     Rx / DC Orders   ED Discharge Orders          Ordered    ciprofloxacin (CIPRO) 500 MG tablet  2 times daily        01/31/23 1237    metroNIDAZOLE (FLAGYL) 500 MG tablet  3 times daily        01/31/23 1237             Note:  This document was prepared using Dragon voice recognition software and may include unintentional dictation errors.   Janith Lima, MD 01/31/23 [phone]

## 2023-01-31 NOTE — ED Notes (Signed)
See triage note Presents with left sided pain and abd pain  States pain started last pm  Afebrile on arrival

## 2023-01-31 NOTE — ED Triage Notes (Addendum)
C/O left flank, lower abd, lower back pain.  Onset last night. Has history of diverticulitis.  Denies dysuria.  Last [facility] 03/30/2022

## 2023-02-02 ENCOUNTER — Emergency Department
Admission: EM | Admit: 2023-02-02 | Discharge: 2023-02-02 | Disposition: A | Discharge status: Home / Self Care | Payer: Medicare PPO | Attending: Emergency Medicine | Admitting: Emergency Medicine

## 2023-02-02 ENCOUNTER — Emergency Department: Payer: Medicare PPO

## 2023-02-02 ENCOUNTER — Other Ambulatory Visit: Payer: Self-pay

## 2023-02-02 DIAGNOSIS — R531 Weakness: Secondary | ICD-10-CM | POA: Diagnosis present

## 2023-02-02 DIAGNOSIS — R29898 Other symptoms and signs involving the musculoskeletal system: Secondary | ICD-10-CM

## 2023-02-02 LAB — CBC
HCT: 37.1 % (ref 36.0–46.0)
Hemoglobin: 11.7 g/dL — ABNORMAL LOW (ref 12.0–15.0)
MCH: 27.4 pg (ref 26.0–34.0)
MCHC: 31.5 g/dL (ref 30.0–36.0)
MCV: 86.9 fL (ref 80.0–100.0)
Platelets: 204 10*3/uL (ref 150–400)
RBC: 4.27 MIL/uL (ref 3.87–5.11)
RDW: 13.3 % (ref 11.5–15.5)
WBC: 3.9 10*3/uL — ABNORMAL LOW (ref 4.0–10.5)
nRBC: 0 % (ref 0.0–0.2)

## 2023-02-02 LAB — MAGNESIUM: Magnesium: 1.9 mg/dL (ref 1.7–2.4)

## 2023-02-02 LAB — BASIC METABOLIC PANEL
Anion gap: 8 (ref 5–15)
BUN: 17 mg/dL (ref 8–23)
CO2: 25 mmol/L (ref 22–32)
Calcium: 9.3 mg/dL (ref 8.9–10.3)
Chloride: 107 mmol/L (ref 98–111)
Creatinine, Ser: 0.95 mg/dL (ref 0.44–1.00)
GFR, Estimated: >60 mL/min (ref >60)
Glucose, Bld: 121 mg/dL — ABNORMAL HIGH (ref 70–99)
Potassium: 3.5 mmol/L (ref 3.5–5.1)
Sodium: 140 mmol/L (ref 135–145)

## 2023-02-02 NOTE — ED Notes (Signed)
Pt to CT

## 2023-02-02 NOTE — ED Triage Notes (Signed)
Pt to ED for bilateral arm weakness and tingling noticed when woke up this morning. Equal grip and strength, full movement. Ambulatory, NAD noted.

## 2023-02-02 NOTE — Discharge Instructions (Signed)
Please follow-up with your primary care provider and orthopedic team for reassessment

## 2023-02-02 NOTE — ED Provider Notes (Signed)
[facility] Provider Note    Event Date/Time   First MD Initiated Contact with Patient 02/02/23 1731     (approximate)   History   Extremity Weakness   HPI Yolanda Barker is a 67 y.o. female presenting today for upper arm weakness.  Patient states earlier this morning she had a brief episode where she felt like her upper arms were both very weak.  This episode lasted only a couple minutes before dissipating.  She denied any numbness.  Denied a specific one-sided weakness, facial droop, slurred speech, extremity weakness in her legs or leg numbness.  No prior history of stroke.  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  Patient recently in the hospital 2 days ago with diverticulitis diagnosis.  Otherwise denies decreased p.o., nausea, vomiting, or diarrhea.  Reviewed recent orthopedic notes outpatient regarding chronic shoulder injuries as well as injections and x-ray of cervical spine showing arthritis.     Physical Exam   Triage Vital Signs: ED Triage Vitals  Encounter Vitals Group     BP 02/02/23 1427 (!) 154/83     Systolic BP Percentile --      Diastolic BP Percentile --      Pulse Rate 02/02/23 1427 71     Resp 02/02/23 1427 16     Temp 02/02/23 1427 97.9 F (36.6 C)     Temp src --      SpO2 02/02/23 1427 96 %     Weight 02/02/23 1428 133 lb (60.3 kg)     Height 02/02/23 1428 4\' 11"  (1.499 m)     Head Circumference --      Peak Flow --      Pain Score 02/02/23 1427 3     Pain Loc --      Pain Education --      Exclude from Growth Chart --     Most recent vital signs: Vitals:   02/02/23 1427  BP: (!) 154/83  Pulse: 71  Resp: 16  Temp: 97.9 F (36.6 C)  SpO2: 96%   I have reviewed the vital signs. General:  Awake, alert, no acute distress. Head:  Normocephalic, Atraumatic. EENT:  PERRL, EOMI, Oral mucosa pink and moist, Neck is  supple. Cardiovascular: Regular rate, 2+ distal pulses. Respiratory:  Normal respiratory effort, symmetrical expansion, no distress.   Extremities:  Moving all four extremities through full ROM without pain.   Neuro:  Alert and oriented.  Interacting appropriately.  No slurred speech.  Cranial nerves II through XII intact.  5 out of 5 strength throughout bilateral upper and lower extremities.  Sensation intact and equal bilaterally.  No gait disturbances.  No ataxia. Skin:  Warm, dry, no rash.   Psych: Appropriate affect.     ED Results / Procedures / Treatments   Labs (all labs ordered are listed, but only abnormal results are displayed) Labs Reviewed  CBC - Abnormal; Notable for the following components:      Result Value   WBC 3.9 (*)    Hemoglobin 11.7 (*)    All other components within normal limits  BASIC METABOLIC PANEL - Abnormal; Notable for the following components:   Glucose, Bld 121 (*)    All other components within normal limits  MAGNESIUM     EKG    RADIOLOGY Independently interpreted CT cervical spine with chronic degenerative  changes but overall comparable to MRI from 3 years ago   PROCEDURES:  Critical Care performed: No  Procedures   MEDICATIONS ORDERED IN ED: Medications - No data to display   IMPRESSION / MDM / ASSESSMENT AND PLAN / ED COURSE  I reviewed the triage vital signs and the nursing notes.                              Differential diagnosis includes, but is not limited to, cervical spine arthritis versus disc impingement, dehydration or electrolyte abnormality  Patient's presentation is most consistent with acute complicated illness / injury requiring diagnostic workup.  Patient is a 67 year old female presenting today for brief episode of bilateral arm weakness that occurred this morning.  Currently denying the symptoms.  No associated numbness.  No other weakness or numbness symptoms throughout the rest of her extremities.  No  other neurological findings.  Patient's neuroexam at the bedside is completely unremarkable.  Bilateral arm weakness does not seem consistent with CVA and patient has no prior history of the same and currently asymptomatic.  Does have chronic history of cervical spine issues likely more indicative of today's symptoms.  Cervical spine CT with no acute pathology but does note chronic degenerative changes.  Separately, patient recently had diverticulitis and evaluated for electrolyte abnormalities as source.  Potassium and magnesium are rather unremarkable.  Patient was reassessed and continues to state no ongoing symptoms.  Will discharge and have her follow-up with her orthopedic team for ongoing evaluation.  Was given strict return precautions.  Clinical Course as of 02/02/23 1924  Thu Feb 02, 2023  1910 CT Cervical Spine Wo Contrast Chronic degenerative changes that are overall comparable to MRI from 3 years ago [DW]    Clinical Course User Index [DW] Janith Lima, MD     FINAL CLINICAL IMPRESSION(S) / ED DIAGNOSES   Final diagnoses:  Bilateral arm weakness     Rx / DC Orders   ED Discharge Orders     None        Note:  This document was prepared using Dragon voice recognition software and may include unintentional dictation errors.   Janith Lima, MD 02/02/23 [phone]

## 2023-02-28 ENCOUNTER — Other Ambulatory Visit: Payer: Self-pay | Admitting: Family Medicine

## 2023-02-28 DIAGNOSIS — Z1231 Encounter for screening mammogram for malignant neoplasm of breast: Secondary | ICD-10-CM

## 2023-04-10 ENCOUNTER — Ambulatory Visit
Admission: RE | Admit: 2023-04-10 | Discharge: 2023-04-10 | Disposition: A | Discharge status: Home / Self Care | Payer: Medicare HMO | Source: Ambulatory Visit | Attending: Family Medicine | Admitting: Family Medicine

## 2023-04-10 DIAGNOSIS — Z1231 Encounter for screening mammogram for malignant neoplasm of breast: Secondary | ICD-10-CM | POA: Insufficient documentation

## 2023-09-04 ENCOUNTER — Emergency Department
Admission: EM | Admit: 2023-09-04 | Discharge: 2023-09-04 | Disposition: A | Discharge status: Home / Self Care | Attending: Emergency Medicine | Admitting: Emergency Medicine

## 2023-09-04 ENCOUNTER — Encounter: Payer: Self-pay | Admitting: Intensive Care

## 2023-09-04 ENCOUNTER — Emergency Department

## 2023-09-04 ENCOUNTER — Other Ambulatory Visit: Payer: Self-pay

## 2023-09-04 DIAGNOSIS — H8110 Benign paroxysmal vertigo, unspecified ear: Secondary | ICD-10-CM | POA: Diagnosis not present

## 2023-09-04 DIAGNOSIS — R42 Dizziness and giddiness: Secondary | ICD-10-CM

## 2023-09-04 LAB — CBC
HCT: 36.8 % (ref 36.0–46.0)
Hemoglobin: 11.7 g/dL — ABNORMAL LOW (ref 12.0–15.0)
MCH: 27.4 pg (ref 26.0–34.0)
MCHC: 31.8 g/dL (ref 30.0–36.0)
MCV: 86.2 fL (ref 80.0–100.0)
Platelets: 220 10*3/uL (ref 150–400)
RBC: 4.27 MIL/uL (ref 3.87–5.11)
RDW: 13.3 % (ref 11.5–15.5)
WBC: 5 10*3/uL (ref 4.0–10.5)
nRBC: 0 % (ref 0.0–0.2)

## 2023-09-04 LAB — COMPREHENSIVE METABOLIC PANEL WITH GFR
ALT: 33 U/L (ref 0–44)
AST: 24 U/L (ref 15–41)
Albumin: 4.1 g/dL (ref 3.5–5.0)
Alkaline Phosphatase: 57 U/L (ref 38–126)
Anion gap: 4 — ABNORMAL LOW (ref 5–15)
BUN: 23 mg/dL (ref 8–23)
CO2: 28 mmol/L (ref 22–32)
Calcium: 9.4 mg/dL (ref 8.9–10.3)
Chloride: 108 mmol/L (ref 98–111)
Creatinine, Ser: 0.84 mg/dL (ref 0.44–1.00)
GFR, Estimated: >60 mL/min (ref >60)
Glucose, Bld: 101 mg/dL — ABNORMAL HIGH (ref 70–99)
Potassium: 4 mmol/L (ref 3.5–5.1)
Sodium: 140 mmol/L (ref 135–145)
Total Bilirubin: 0.4 mg/dL (ref 0.0–1.2)
Total Protein: 7 g/dL (ref 6.5–8.1)

## 2023-09-04 NOTE — Discharge Instructions (Addendum)
 As we discussed, you can increase your meclizine/Antivert medication to take 2 or 3 times per day as needed.  If you continue to have worsening symptoms go see your ENT doctor  If you have passing out, strokelike symptoms or other concerns then please return to the ED

## 2023-09-04 NOTE — ED Provider Notes (Signed)
 [facility] Provider Note    Event Date/Time   First MD Initiated Contact with Patient 09/04/23 1734     (approximate)   History   Dizziness   HPI  Yolanda Barker is a 68 y.o. female who presents to the ED for evaluation of Dizziness   I review an outpatient orthopedic clinic visit from 3 days ago seen for chronic shoulder pain.  Underwent a subacromial steroid injection of the left shoulder at that time.  Otherwise history of vertigo with prescribed as needed meclizine.  Patient presents for evaluation of acute on chronic intermittent positional vertigo.  Reports typical vertigo symptoms when she tilts her head downward, chin to chest.  Reports that she is often feeling much better after meclizine but she reports having to take her meclizine more frequently over the past few days and wanted to make sure that she was okay.  She reports often taking up to 1 time per day as needed, but after taking it twice per day yesterday she reports feeling much better but reports it is unusual to have to take it so frequently.  Alongside her more frequent vertigo, she reports no other worsening symptoms.  No coexisting weakness, numbness, vision changes, syncope.  No recent falls or head injuries.   Physical Exam   Triage Vital Signs: ED Triage Vitals  Encounter Vitals Group     BP 09/04/23 1427 (!) 197/88     Girls Systolic BP Percentile --      Girls Diastolic BP Percentile --      Boys Systolic BP Percentile --      Boys Diastolic BP Percentile --      Pulse Rate 09/04/23 1427 78     Resp 09/04/23 1427 18     Temp 09/04/23 1427 98.5 F (36.9 C)     Temp Source 09/04/23 1427 Oral     SpO2 09/04/23 1427 100 %     Weight 09/04/23 1428 129 lb (58.5 kg)     Height 09/04/23 1428 4' 11 (1.499 m)     Head Circumference --      Peak Flow --      Pain Score 09/04/23 1428 0     Pain Loc --      Pain Education --      Exclude from Growth Chart --     Most recent  vital signs: Vitals:   09/04/23 1745 09/04/23 1800  BP: (!) 192/85 (!) 176/73  Pulse: 61 63  Resp: 14 12  Temp:    SpO2: 100% 98%    General: Awake, no distress.  Ambulatory independently with a normal gait.  Well-appearing. CV:  Good peripheral perfusion.  Resp:  Normal effort.  Abd:  No distention.  MSK:  No deformity noted.  Neuro:  No focal deficits appreciated. Cranial nerves II through XII intact 5/5 strength and sensation in all 4 extremities Other:      ED Results / Procedures / Treatments   Labs (all labs ordered are listed, but only abnormal results are displayed) Labs Reviewed  COMPREHENSIVE METABOLIC PANEL WITH GFR - Abnormal; Notable for the following components:      Result Value   Glucose, Bld 101 (*)    Anion gap 4 (*)    All other components within normal limits  CBC - Abnormal; Notable for the following components:   Hemoglobin 11.7 (*)    All other components within normal limits  URINALYSIS, ROUTINE W REFLEX MICROSCOPIC  EKG Sinus rhythm with a rate of 66 bpm.  Normal axis and intervals without clear signs of acute ischemia.  RADIOLOGY CT head interpreted by me without evidence of acute intracranial pathology  Official radiology report(s): CT Head Wo Contrast Result Date: 09/04/2023 CLINICAL DATA:  Diplopia, intermittent blurred vision EXAM: CT HEAD WITHOUT CONTRAST TECHNIQUE: Contiguous axial images were obtained from the base of the skull through the vertex without intravenous contrast. RADIATION DOSE REDUCTION: This exam was performed according to the departmental dose-optimization program which includes automated exposure control, adjustment of the mA and/or kV according to patient size and/or use of iterative reconstruction technique. COMPARISON:  06/20/2010 FINDINGS: Brain: No acute intracranial abnormality. Specifically, no hemorrhage, hydrocephalus, mass lesion, acute infarction, or significant intracranial injury. Vascular: No hyperdense  vessel or unexpected calcification. Skull: No acute calvarial abnormality. Sinuses/Orbits: No acute findings. Old left medial orbital wall blowout fracture, unchanged. Other: None IMPRESSION: No acute intracranial abnormality. Electronically Signed   By: Franky Crease M.D.   On: 09/04/2023 16:42    PROCEDURES and INTERVENTIONS:  .1-3 Lead EKG Interpretation  Performed by: Claudene Rover, MD Authorized by: Claudene Rover, MD     Interpretation: normal     ECG rate:  66   ECG rate assessment: normal     Rhythm: sinus rhythm     Ectopy: none     Conduction: normal     Medications - No data to display   IMPRESSION / MDM / ASSESSMENT AND PLAN / ED COURSE  I reviewed the triage vital signs and the nursing notes.  Differential diagnosis includes, but is not limited to, BPPV, stroke, cardiac dysrhythmia, symptomatic anemia, TIA  {Patient presents with symptoms of an acute illness or injury that is potentially life-threatening.   Patient presents with acute on chronic symptoms of BPPV suitable for outpatient management with ENT follow-up.  Asymptomatic and well-appearing now with a normal exam.  Reassuring CT head and basic labs.  Normal metabolic panel and CBC.  No dysrhythmias noted on the monitor.  Suitable for trial of outpatient management.  Discussed ENT follow-up and ED return precautions     FINAL CLINICAL IMPRESSION(S) / ED DIAGNOSES   Final diagnoses:  Vertigo  Benign paroxysmal positional vertigo, unspecified laterality     Rx / DC Orders   ED Discharge Orders     None        Note:  This document was prepared using Dragon voice recognition software and may include unintentional dictation errors.   Claudene Rover, MD 09/04/23 [phone]

## 2023-09-04 NOTE — ED Triage Notes (Signed)
 Patient c/o dizziness since Saturday. Reports intermittent blurry vision. Reports some nausea   History vertigo but her medicine has not helped symptoms

## 2023-11-06 ENCOUNTER — Encounter: Payer: Self-pay | Admitting: Orthopedic Surgery

## 2023-11-06 NOTE — Progress Notes (Unsigned)
 Referring Physician:  Center, Carlin Blamer [facility] [address] [place],  [place] [address]  Primary Physician:  Center, Carlin Blamer [facility]  History of Present Illness: 11/08/2023 [name] has a history of GERD, anxiety, HTN.   She has seen ortho in the past for left shoulder pain and had improvement with previous injections.   She saw Dr. Clois on 09/26/19 with very mild cervical myelopathy. 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 He offered her surgery and she declined.   She has intermittent left sided neck pain and stiffness along with more of a soreness in her left shoulder. Minimal right sided neck/shoulder pain. She has intermittent burning in both arms with intermittent tingling in both hands. She has intermittent weakness in her arms.   No new dexterity issues or changes in her balance.   Tobacco use: Does not smoke.   Bowel/Bladder Dysfunction: none  Conservative measures:  Physical therapy:  has not participated in Multimodal medical therapy including regular antiinflammatories:  Tylenol , Ibuprofen , Tizanidine, Gabapentin Injections:  09/01/23- Large Joint Injection: L subacromial bursa   Past Surgery: no spine or neck surgery  Yolanda Barker has no symptoms of cervical myelopathy.  The symptoms are causing a significant impact on the patient's life.   Review of Systems:  A 10 point review of systems is negative, except for the pertinent positives and negatives detailed in the HPI.  Past Medical History: Past Medical History:  Diagnosis Date   Adhesive capsulitis of right shoulder    Anemia    Anxiety    Arthritis    Diabetes mellitus without complication (HCC)    Diverticulitis    Dyspnea    GERD (gastroesophageal reflux disease)    Hypertension    IDA (iron deficiency anemia)    Injury of tendon of Himmelberger head of right biceps    Nontraumatic incomplete  tear of right rotator cuff    Rotator cuff tendinitis, right    Vertigo     Past Surgical History: Past Surgical History:  Procedure Laterality Date   ABDOMINAL HYSTERECTOMY     COLONOSCOPY WITH PROPOFOL  N/A 03/15/2016   Procedure: COLONOSCOPY WITH PROPOFOL ;  Surgeon: Gladis RAYMOND Mariner, MD;  Location: [facility] ENDOSCOPY;  Service: Endoscopy;  Laterality: N/A;   COLONOSCOPY WITH PROPOFOL  N/A 11/15/2019   Procedure: COLONOSCOPY WITH PROPOFOL ;  Surgeon: Maryruth Ole DASEN, MD;  Location: [facility] ENDOSCOPY;  Service: Gastroenterology;  Laterality: N/A;   COLONOSCOPY WITH PROPOFOL  N/A 07/26/2022   Procedure: COLONOSCOPY WITH PROPOFOL ;  Surgeon: Maryruth Ole DASEN, MD;  Location: [facility] ENDOSCOPY;  Service: Gastroenterology;  Laterality: N/A;   SHOULDER ARTHROSCOPY WITH OPEN ROTATOR CUFF REPAIR Right 11/14/2017   Procedure: SHOULDER ARTHROSCOPY WITH OPEN ROTATOR CUFF REPAIR;  Surgeon: Edie Norleen PARAS, MD;  Location: [facility] ORS;  Service: Orthopedics;  Laterality: Right;   SHOULDER CLOSED REDUCTION Right 03/13/2018   Procedure: CLOSED MANIPULATION SHOULDER;  Surgeon: Edie Norleen PARAS, MD;  Location: [facility] ORS;  Service: Orthopedics;  Laterality: Right;   STERIOD INJECTION Right 03/13/2018   Procedure: STEROID INJECTION RIGHT SHOULDER;  Surgeon: Edie Norleen PARAS, MD;  Location: [facility] ORS;  Service: Orthopedics;  Laterality: Right;   TONSILLECTOMY     WISDOM TOOTH EXTRACTION      Allergies: Allergies as of 11/08/2023 - Review Complete 11/08/2023  Allergen Reaction Noted   Penicillin g Rash and Other (See Comments) 06/03/2015    Medications: Outpatient Encounter Medications as  of 11/08/2023  Medication Sig   ACCU-CHEK GUIDE TEST test strip 1 strip.   acetaminophen  (TYLENOL ) 500 MG tablet Take 500 mg by mouth every 6 (six) hours as needed for moderate pain or headache.    Cholecalciferol (VITAMIN D-1000 MAX ST) 1000 units tablet Take 1,000 Units by mouth daily.    docusate sodium  (COLACE) 100 MG capsule Take 1  tablet once or twice daily as needed for constipation   ferrous sulfate 325 (65 FE) MG tablet Take 325 mg by mouth daily.    ibuprofen  (ADVIL ) 200 MG tablet Take 200 mg by mouth every 6 (six) hours as needed.   losartan (COZAAR) 25 MG tablet Take 25 mg by mouth daily.   meclizine (ANTIVERT) 25 MG tablet Take 25 mg by mouth 3 (three) times daily as needed for dizziness.    polyethylene glycol (MIRALAX / GLYCOLAX) 17 g packet Take 17 g by mouth daily.   Propylene Glycol (SYSTANE BALANCE) 0.6 % SOLN Place 1 drop into both eyes daily as needed (for dry eyes).   [DISCONTINUED] ALPRAZolam (XANAX) 1 MG tablet Take 1 mg by mouth at bedtime as needed for anxiety.   [DISCONTINUED] albuterol  (VENTOLIN  HFA) 108 (90 Base) MCG/ACT inhaler Inhale 2 puffs into the lungs every 4 (four) hours as needed for wheezing or shortness of breath.   [DISCONTINUED] benzonatate  (TESSALON ) 200 MG capsule Take 1 capsule (200 mg total) by mouth 3 (three) times daily as needed for cough.   [DISCONTINUED] cetirizine (ZYRTEC) 10 MG tablet Take 10 mg by mouth daily.   [DISCONTINUED] docusate calcium (SURFAK) 240 MG capsule Take 240 mg by mouth daily.   [DISCONTINUED] fexofenadine (ALLEGRA) 180 MG tablet Take 180 mg by mouth as needed.    [DISCONTINUED] fluticasone (FLONASE ALLERGY RELIEF) 50 MCG/ACT nasal spray Place 2 sprays into both nostrils daily as needed for allergies.    [DISCONTINUED] gabapentin (NEURONTIN) 300 MG capsule Take 300 mg by mouth 3 (three) times daily.   [DISCONTINUED] methocarbamol  (ROBAXIN ) 500 MG tablet Take 500 mg by mouth 4 (four) times daily.   [DISCONTINUED] methylPREDNISolone  (MEDROL  DOSEPAK) 4 MG TBPK tablet Take as directed   [DISCONTINUED] omeprazole (PRILOSEC) 20 MG capsule Take 20 mg by mouth 2 (two) times daily as needed (for acid reflux).    No facility-administered encounter medications on file as of 11/08/2023.    Social History: Social History   Tobacco Use   Smoking status: Former     Current packs/day: 0.00    Average packs/day: 1 pack/day for 15.0 years (15.0 ttl pk-yrs)    Types: Cigarettes    Start date: 11/08/1982    Quit date: 11/07/1997    Years since quitting: 26.0   Smokeless tobacco: Never  Vaping Use   Vaping status: Never Used  Substance Use Topics   Alcohol use: No   Drug use: No    Family Medical History: Family History  Problem Relation Age of Onset   Bone cancer Mother    Stroke Father    Breast cancer Neg Hx     Physical Examination: Vitals:   11/08/23 1103  BP: 126/74    General: Patient is well developed, well nourished, calm, collected, and in no apparent distress. Attention to examination is appropriate.  Respiratory: Patient is breathing without any difficulty.   NEUROLOGICAL:     Awake, alert, oriented to person, place, and time.  Speech is clear and fluent. Fund of knowledge is appropriate.   Cranial Nerves: Pupils equal round and reactive to  light.  Facial tone is symmetric.    No posterior cervical tenderness. Mild tenderness in left trapezial region.   No abnormal lesions on exposed skin.   Strength: Side Biceps Triceps Deltoid Interossei Grip Wrist Ext. Wrist Flex.  R 5 5 5 5 5 5 5   L 5 5 5 5 5 5 5    Side Iliopsoas Quads Hamstring PF DF EHL  R 5 5 5 5 5 5   L 5 5 5 5 5 5    Reflexes are 1+ and symmetric at the biceps, brachioradialis, patella and achilles.   Hoffman's is absent.  Clonus is not present.   Bilateral upper and lower extremity sensation is intact to light touch.     Gait is normal.   Minimal difficulty with heel/toe walking.   Medical Decision Making  Imaging: CT of cervical spine dated 02/02/23:  FINDINGS: Alignment: No subluxation.  Loss of lordosis.   Skull base and vertebrae: No acute fracture. No primary bone lesion or focal pathologic process.   Soft tissues and spinal canal: No prevertebral fluid or swelling. No visible canal hematoma.   Disc levels: Advanced degenerative disc  disease with disc space narrowing and spurring. Disc bulges noted from C3-4 through C7-T1. Severe central canal stenosis noted at C4-5 through C6-7 related to disc bulges and endplate osteophytes. Findings similar to prior MRI. Severe neural foraminal stenosis bilaterally at C4-5 with moderate neural foraminal narrowing at C5-6 and C6-7.   Upper chest: No acute findings   Other: None   IMPRESSION: No acute bony abnormality.   Moderate to advanced degenerative disc and facet disease with central canal and neural foraminal stenosis as described above. Findings are not significantly changed since prior MRI from 08/11/2019.     Electronically Signed   By: Franky Crease M.D.   On: 02/02/2023 19:03  I have personally reviewed the images and agree with the above interpretation.  Assessment and Plan: [name] has intermittent left sided neck pain and stiffness along with more of a soreness in her left shoulder. Minimal right sided neck/shoulder pain. She has intermittent burning in both arms with intermittent tingling in both hands. She has intermittent weakness in her arms.   CT from 2024 shows moderate/advanced cervical DDD with severe central stenosis C4-C7 and multilevel foraminal stenosis.   Previous imaging from 2021 showed severe central stenosis at multiple levels that was worst at C4-C5 where she had myelomalacia.  She does not appear to have any signs/symptoms of cervical myelopathy.   Treatment options discussed with patient and following plan made:   - MRI of cervical spine to further evaluate spinal stenosis, previously seen myelomalacia. She requests WIDE BORE MRI.  - Will schedule phone visit to review MRI results once I get them back.  - She may need to see Dr. Clois to revisit surgery options.   I spent a total of 30 minutes in face-to-face and non-face-to-face activities related to this patient's care today including review of outside records, review of imaging,  review of symptoms, physical exam, discussion of differential diagnosis, discussion of treatment options, and documentation.   Thank you for involving me in the care of this patient.   Glade Boys PA-C Dept. of Neurosurgery

## 2023-11-08 ENCOUNTER — Ambulatory Visit: Admitting: Orthopedic Surgery

## 2023-11-08 ENCOUNTER — Encounter: Payer: Self-pay | Admitting: Orthopedic Surgery

## 2023-11-08 VITALS — BP 126/74 | Ht 59.0 in | Wt 126.0 lb

## 2023-11-08 DIAGNOSIS — M47812 Spondylosis without myelopathy or radiculopathy, cervical region: Secondary | ICD-10-CM

## 2023-11-08 DIAGNOSIS — M50321 Other cervical disc degeneration at C4-C5 level: Secondary | ICD-10-CM

## 2023-11-08 DIAGNOSIS — M50322 Other cervical disc degeneration at C5-C6 level: Secondary | ICD-10-CM

## 2023-11-08 DIAGNOSIS — G959 Disease of spinal cord, unspecified: Secondary | ICD-10-CM

## 2023-11-08 DIAGNOSIS — M50323 Other cervical disc degeneration at C6-C7 level: Secondary | ICD-10-CM | POA: Diagnosis not present

## 2023-11-08 DIAGNOSIS — M4802 Spinal stenosis, cervical region: Secondary | ICD-10-CM

## 2023-11-08 DIAGNOSIS — G9589 Other specified diseases of spinal cord: Secondary | ICD-10-CM

## 2023-11-08 DIAGNOSIS — M5412 Radiculopathy, cervical region: Secondary | ICD-10-CM

## 2023-11-08 NOTE — Patient Instructions (Signed)
 It was so nice to see you today. Thank you so much for coming in.    You have wear and tear in your neck with spinal stenosis.   I want to get an MRI of your neck to check on your spinal stenosis.  We will get this approved through your insurance and [facility] will call you to schedule the appointment. Ask about your patient responsibility. You do not need to pay this prior to getting MRI, they can bill you.   Make sure they schedule you for the WIDE BORE MRI.   After you have the MRI, it can take 14-28 days for me to get the results back. If I don't have them in 2 weeks, we will call to try to get the results.   Once I have the results, we will call you to schedule a follow up phone visit with me to review them.   Please do not hesitate to call if you have any questions or concerns. You can also message me in MyChart.   Glade Boys PA-C [phone]     The physicians and staff at [facility] at [facility] are committed to providing excellent care. You may receive a survey asking for feedback about your experience at our office. We value you your feedback and appreciate you taking the time to to fill it out. The [facility] leadership team is also available to discuss your experience in person, feel free to contact us  [phone].

## 2023-11-22 ENCOUNTER — Ambulatory Visit
Admission: RE | Admit: 2023-11-22 | Discharge: 2023-11-22 | Disposition: A | Discharge status: Home / Self Care | Source: Ambulatory Visit | Attending: Orthopedic Surgery | Admitting: Orthopedic Surgery

## 2023-11-22 DIAGNOSIS — M4802 Spinal stenosis, cervical region: Secondary | ICD-10-CM | POA: Diagnosis present

## 2023-11-22 DIAGNOSIS — M5412 Radiculopathy, cervical region: Secondary | ICD-10-CM | POA: Insufficient documentation

## 2023-11-22 DIAGNOSIS — M47812 Spondylosis without myelopathy or radiculopathy, cervical region: Secondary | ICD-10-CM | POA: Insufficient documentation

## 2023-11-22 DIAGNOSIS — G959 Disease of spinal cord, unspecified: Secondary | ICD-10-CM | POA: Diagnosis present

## 2023-11-27 NOTE — Progress Notes (Unsigned)
 Telephone Visit- Progress Note: Referring Physician:  Center, Carlin Blamer [facility] [address] [place],  [place] [address]  Primary Physician:  Center, Carlin Blamer [facility]  This visit was performed via telephone.  Patient location: home Provider location: office  I spent a total of 10 minutes non-face-to-face activities for this visit on the date of this encounter including review of current clinical condition and response to treatment.    Patient has given verbal consent to this telephone visits and we reviewed the limitations of a telephone visit. Patient wishes to proceed.    Chief Complaint:  review imaging  History of Present Illness: Yolanda Barker is a 68 y.o. female has a history of  GERD, anxiety, HTN.    She has seen ortho in the past for left shoulder pain and had improvement with previous injections.    She saw Dr. Clois on 09/26/19 with very mild cervical myelopathy. 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 He offered her surgery and she declined.   She saw me on 11/08/23 with left sided neck pain and stiffness in her left shoulder. She did not appear to have any signs/symptoms of cervical myelopathy at her visit.   CT from 2024 showed moderate/advanced cervical DDD with severe central stenosis C4-C7 and multilevel foraminal stenosis.    Previous imaging from 2021 showed severe central stenosis at multiple levels that was worst at C4-C5 where she had myelomalacia.  MRI of cervical spine ordered and phone visit scheduled to discuss her results.   She has some soreness in her neck on the left side that is intermittent. She continues with intermittent burning in both arms with intermittent tingling in both hands. She has intermittent weakness in her arms. She has intermittent burning in legs as well with intermittent. No issues with walking or balance.    No new dexterity  issues or changes in her balance.    Tobacco use: Does not smoke.    Bowel/Bladder Dysfunction: none   Conservative measures:  Physical therapy:  has not participated in Multimodal medical therapy including regular antiinflammatories:  Tylenol , Ibuprofen , Tizanidine, Gabapentin Injections:  09/01/23- Large Joint Injection: L subacromial bursa    Past Surgery: no spine or neck surgery   [name] has no symptoms of cervical myelopathy.  Exam: No exam done as this was a telephone encounter.     Imaging: Cervical MRI dated 11/22/23:  FINDINGS: Alignment: Reversal of the normal cervical lordosis, apex at C4-5. Underlying dextroscoliosis. No significant listhesis.   Vertebrae: Vertebral body height maintained without acute or chronic fracture. Bone marrow signal intensity overall within normal limits. No worrisome osseous lesions. Prominent degenerative reactive endplate change with marrow edema present about the C3-4 through C6-7 interspaces.   Cord: Patchy signal abnormality seen involving the bilateral cord at the level of C4-5, consistent with chronic myelomalacia. Otherwise normal signal morphology.   Posterior Fossa, vertebral arteries, paraspinal tissues: Visualized brain and posterior fossa within normal limits. Craniocervical junction normal. Paraspinous soft tissues within normal limits. Normal flow voids seen within the vertebral arteries bilaterally.   Disc levels:   C2-C3: Negative interspace. Moderate left with mild right facet arthrosis. No spinal stenosis. Mild left C3 foraminal narrowing. Right neural foramina remains patent.   C3-C4: Degenerative vertebral disc space narrowing. Right paracentral disc protrusion indents the right ventral thecal sac, contacting and flattening the ventral cord (series 8, image 12). Superimposed mild left-sided  facet arthrosis. Resultant mild spinal stenosis. Bilateral uncovertebral spurring with severe left and moderate  right C4 foraminal narrowing.   C4-C5: Degenerative intervertebral disc space narrowing with diffuse disc osteophyte complex. Superimposed central disc protrusion indents the ventral thecal sac, contacting and flattening the ventral cord (series 9, image 16). Associated cord signal changes consistent with chronic myelomalacia. Moderate spinal stenosis. Severe right with moderate left C5 foraminal narrowing.   C5-C6: Degenerative intervertebral disc space narrowing with diffuse disc osteophyte complex. Posterior component indents the ventral thecal sac. Secondary cord flattening with moderate spinal stenosis. Severe left with moderate right C6 foraminal narrowing.   C6-C7: Degenerative disc space narrowing with diffuse disc osteophyte complex. Superimposed right paracentral disc protrusion (series 9, image 24). Secondary cord flattening without cord signal changes. Severe spinal stenosis at this level with the thecal sac measuring 4 mm in AP diameter at its most narrow point. Uncovertebral spurring with severe right and moderate left C7 foraminal narrowing.   C7-T1: Negative interspace. Mild bilateral facet hypertrophy. No canal or foraminal stenosis.   Mild noncompressive disc bulging noted within the upper thoracic spine. Superimposed right greater than left facet hypertrophy. No spinal stenosis. Mild to moderate right-sided foraminal narrowing at T1-2 through T3-4.   IMPRESSION: 1. Multilevel cervical spondylosis with resultant moderate to severe diffuse spinal stenosis at C3-4 through C6-7, most pronounced at C6-7. 2. Multilevel moderate to severe bilateral foraminal narrowing at C4 through C7 as above. 3. Patchy cord signal abnormality at the level of C4-5, consistent with chronic myelomalacia.     Electronically Signed   By: Morene Hoard M.D.   On: 11/26/2023 04:26    I have personally reviewed the images and agree with the above interpretation.  Assessment  and Plan: [name] has some soreness in her neck on the left side that is intermittent. She continues with intermittent burning in both arms with intermittent tingling in both hands. She has intermittent weakness in her arms. She has intermittent burning in legs as well with intermittent.   No new dexterity issues or changes in her balance.   She has known chronic myelomalacia C4-C5 with moderate/severe central stenosis. She has moderate central stenosis C5-C6 and severe central stenosis C6-C7. She has multilevel moderate/severe foraminal stenosis.    Treatment options discussed with patient and following plan made:    - She continues with chronic myelomalacia and severe central stenosis. She does not appear to have any signs/symptoms of cervical myelopathy.  - I still recommend that she follow up with Dr. Clois to discuss possible surgery options. This appointment was scheduled.   Glade Boys PA-C Neurosurgery

## 2023-11-28 ENCOUNTER — Ambulatory Visit (INDEPENDENT_AMBULATORY_CARE_PROVIDER_SITE_OTHER): Admitting: Orthopedic Surgery

## 2023-11-28 ENCOUNTER — Encounter: Payer: Self-pay | Admitting: Orthopedic Surgery

## 2023-11-28 DIAGNOSIS — G959 Disease of spinal cord, unspecified: Secondary | ICD-10-CM | POA: Diagnosis not present

## 2023-11-28 DIAGNOSIS — M4802 Spinal stenosis, cervical region: Secondary | ICD-10-CM | POA: Diagnosis not present

## 2023-11-28 DIAGNOSIS — M4722 Other spondylosis with radiculopathy, cervical region: Secondary | ICD-10-CM | POA: Diagnosis not present

## 2023-11-28 DIAGNOSIS — M5412 Radiculopathy, cervical region: Secondary | ICD-10-CM

## 2023-11-28 DIAGNOSIS — M47812 Spondylosis without myelopathy or radiculopathy, cervical region: Secondary | ICD-10-CM

## 2023-12-07 NOTE — Progress Notes (Signed)
 Referring Physician:  [facility] [address] [place],  [place] [address]  Primary Physician:  [facility]  History of Present Illness: 12/12/2023 [name] is here today with a chief complaint of cervical stenosis.  She gets intermittent hand numbness.  She denies any problems with her balance.  She has some neck stiffness.  11/28/2023 Note from Glade Boys, PA-C History of Present Illness: Yolanda Barker is a 68 y.o. female has a history of  GERD, anxiety, HTN.    She has seen ortho in the past for left shoulder pain and had improvement with previous injections.    She saw Dr. Clois on 09/26/19 with very mild cervical myelopathy. 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 He offered her surgery and she declined.    She saw me on 11/08/23 with left sided neck pain and stiffness in her left shoulder. She did not appear to have any signs/symptoms of cervical myelopathy at her visit.    CT from 2024 showed moderate/advanced cervical DDD with severe central stenosis C4-C7 and multilevel foraminal stenosis.    Previous imaging from 2021 showed severe central stenosis at multiple levels that was worst at C4-C5 where she had myelomalacia.   MRI of cervical spine ordered and phone visit scheduled to discuss her results.    She has some soreness in her neck on the left side that is intermittent. She continues with intermittent burning in both arms with intermittent tingling in both hands. She has intermittent weakness in her arms. She has intermittent burning in legs as well with intermittent. No issues with walking or balance.    No new dexterity issues or changes in her balance.    Tobacco use: Does not smoke.    Bowel/Bladder Dysfunction: none   Conservative measures:  Physical therapy:  has not participated in Multimodal medical therapy including  regular antiinflammatories:  Tylenol , Ibuprofen , Tizanidine, Gabapentin Injections:  09/01/23- Large Joint Injection: L subacromial bursa    Past Surgery: no spine or neck surgery    Review of Systems:  A 10 point review of systems is negative, except for the pertinent positives and negatives detailed in the HPI.  Past Medical History: Past Medical History:  Diagnosis Date   Adhesive capsulitis of right shoulder    Anemia    Anxiety    Arthritis    Diabetes mellitus without complication (HCC)    Diverticulitis    Dyspnea    GERD (gastroesophageal reflux disease)    Hypertension    IDA (iron deficiency anemia)    Injury of tendon of [place] head of right biceps    Nontraumatic incomplete tear of right rotator cuff    Rotator cuff tendinitis, right    Vertigo     Past Surgical History: Past Surgical History:  Procedure Laterality Date   ABDOMINAL HYSTERECTOMY     COLONOSCOPY WITH PROPOFOL  N/A 03/15/2016   Procedure: COLONOSCOPY WITH PROPOFOL ;  Surgeon: Gladis RAYMOND Mariner, MD;  Location: [facility] ENDOSCOPY;  Service: Endoscopy;  Laterality: N/A;   COLONOSCOPY WITH PROPOFOL  N/A 11/15/2019   Procedure: COLONOSCOPY WITH PROPOFOL ;  Surgeon: Maryruth Ole DASEN, MD;  Location: [facility] ENDOSCOPY;  Service: Gastroenterology;  Laterality: N/A;   COLONOSCOPY WITH PROPOFOL  N/A 07/26/2022   Procedure: COLONOSCOPY WITH PROPOFOL ;  Surgeon: Maryruth Ole DASEN, MD;  Location: [facility] ENDOSCOPY;  Service: Gastroenterology;  Laterality: N/A;   SHOULDER ARTHROSCOPY WITH OPEN  ROTATOR CUFF REPAIR Right 11/14/2017   Procedure: SHOULDER ARTHROSCOPY WITH OPEN ROTATOR CUFF REPAIR;  Surgeon: Edie Norleen PARAS, MD;  Location: [facility] ORS;  Service: Orthopedics;  Laterality: Right;   SHOULDER CLOSED REDUCTION Right 03/13/2018   Procedure: CLOSED MANIPULATION SHOULDER;  Surgeon: Edie Norleen PARAS, MD;  Location: [facility] ORS;  Service: Orthopedics;  Laterality: Right;   STERIOD INJECTION Right 03/13/2018   Procedure: STEROID  INJECTION RIGHT SHOULDER;  Surgeon: Edie Norleen PARAS, MD;  Location: [facility] ORS;  Service: Orthopedics;  Laterality: Right;   TONSILLECTOMY     WISDOM TOOTH EXTRACTION      Allergies: Allergies as of 12/12/2023 - Review Complete 12/12/2023  Allergen Reaction Noted   Penicillin g Rash and Other (See Comments) 06/03/2015    Medications:  Current Outpatient Medications:    ACCU-CHEK GUIDE TEST test strip, 1 strip., Disp: , Rfl:    acetaminophen  (TYLENOL ) 500 MG tablet, Take 500 mg by mouth every 6 (six) hours as needed for moderate pain or headache. , Disp: , Rfl:    Cholecalciferol (VITAMIN D-1000 MAX ST) 1000 units tablet, Take 1,000 Units by mouth daily. , Disp: , Rfl:    docusate sodium  (COLACE) 100 MG capsule, Take 1 tablet once or twice daily as needed for constipation, Disp: 30 capsule, Rfl: 0   ferrous sulfate 325 (65 FE) MG tablet, Take 325 mg by mouth daily. , Disp: , Rfl:    ibuprofen  (ADVIL ) 200 MG tablet, Take 200 mg by mouth every 6 (six) hours as needed., Disp: , Rfl:    losartan (COZAAR) 25 MG tablet, Take 25 mg by mouth daily., Disp: , Rfl:    meclizine (ANTIVERT) 25 MG tablet, Take 25 mg by mouth 3 (three) times daily as needed for dizziness. , Disp: , Rfl:    polyethylene glycol (MIRALAX / GLYCOLAX) 17 g packet, Take 17 g by mouth daily., Disp: , Rfl:    Propylene Glycol (SYSTANE BALANCE) 0.6 % SOLN, Place 1 drop into both eyes daily as needed (for dry eyes)., Disp: , Rfl:   Social History: Social History   Tobacco Use   Smoking status: Former    Current packs/day: 0.00    Average packs/day: 1 pack/day for 15.0 years (15.0 ttl pk-yrs)    Types: Cigarettes    Start date: 11/08/1982    Quit date: 11/07/1997    Years since quitting: 26.1   Smokeless tobacco: Never  Vaping Use   Vaping status: Never Used  Substance Use Topics   Alcohol use: No   Drug use: No    Family Medical History: Family History  Problem Relation Age of Onset   Bone cancer Mother    Stroke  Father    Breast cancer Neg Hx     Physical Examination: Vitals:   12/12/23 1011  BP: (!) 140/70    General: Patient is in no apparent distress. Attention to examination is appropriate.  Neck:   Supple.  Full range of motion.  Respiratory: Patient is breathing without any difficulty.   NEUROLOGICAL:     Awake, alert, oriented to person, place, and time.  Speech is clear and fluent.   Cranial Nerves: Pupils equal round and reactive to light.  Facial tone is symmetric.  Facial sensation is symmetric. Shoulder shrug is symmetric. Tongue protrusion is midline.  There is no pronator drift.  Strength: Side Biceps Triceps Deltoid Interossei Grip Wrist Ext. Wrist Flex.  R 5 5 5 5 5 5 5   L 5 5 5  5  5 5 5    Side Iliopsoas Quads Hamstring PF DF EHL  R 5 5 5 5 5 5   L 5 5 5 5 5 5    Reflexes are 1+ and symmetric at the biceps, triceps, brachioradialis, patella and achilles.   Hoffman's is absent.   Bilateral upper and lower extremity sensation is intact to light touch.    No evidence of dysmetria noted.  Gait is normal.     Medical Decision Making  Imaging: MRI C spine 11/22/2023 Disc levels:   C2-C3: Negative interspace. Moderate left with mild right facet arthrosis. No spinal stenosis. Mild left C3 foraminal narrowing. Right neural foramina remains patent.   C3-C4: Degenerative vertebral disc space narrowing. Right paracentral disc protrusion indents the right ventral thecal sac, contacting and flattening the ventral cord (series 8, image 12). Superimposed mild left-sided facet arthrosis. Resultant mild spinal stenosis. Bilateral uncovertebral spurring with severe left and moderate right C4 foraminal narrowing.   C4-C5: Degenerative intervertebral disc space narrowing with diffuse disc osteophyte complex. Superimposed central disc protrusion indents the ventral thecal sac, contacting and flattening the ventral cord (series 9, image 16). Associated cord signal  changes consistent with chronic myelomalacia. Moderate spinal stenosis. Severe right with moderate left C5 foraminal narrowing.   C5-C6: Degenerative intervertebral disc space narrowing with diffuse disc osteophyte complex. Posterior component indents the ventral thecal sac. Secondary cord flattening with moderate spinal stenosis. Severe left with moderate right C6 foraminal narrowing.   C6-C7: Degenerative disc space narrowing with diffuse disc osteophyte complex. Superimposed right paracentral disc protrusion (series 9, image 24). Secondary cord flattening without cord signal changes. Severe spinal stenosis at this level with the thecal sac measuring 4 mm in AP diameter at its most narrow point. Uncovertebral spurring with severe right and moderate left C7 foraminal narrowing.   C7-T1: Negative interspace. Mild bilateral facet hypertrophy. No canal or foraminal stenosis.   Mild noncompressive disc bulging noted within the upper thoracic spine. Superimposed right greater than left facet hypertrophy. No spinal stenosis. Mild to moderate right-sided foraminal narrowing at T1-2 through T3-4.   IMPRESSION: 1. Multilevel cervical spondylosis with resultant moderate to severe diffuse spinal stenosis at C3-4 through C6-7, most pronounced at C6-7. 2. Multilevel moderate to severe bilateral foraminal narrowing at C4 through C7 as above. 3. Patchy cord signal abnormality at the level of C4-5, consistent with chronic myelomalacia.     Electronically Signed   By: Morene Hoard M.D.   On: 11/26/2023 04:26   I have personally reviewed the images and agree with the above interpretation.  Assessment and Plan: Ms. Goodridge is a pleasant 68 y.o. female with cervical stenosis and myelomalacia.  She has some stiffness in her neck.  She does not have substantial symptoms of myelopathy.  We discussed that 1 could consider surgery for this, but she is currently asymptomatic.  I did not  strongly recommend surgery.  We discussed the symptoms that would trigger a recommendation of surgery.  She expressed understanding.  Will start some physical therapy for her neck stiffness and see her back in clinic in 6 to 8 weeks.   I spent a total of 15 minutes in this patient's care today. This time was spent reviewing pertinent records including imaging studies, obtaining and confirming history, performing a directed evaluation, formulating and discussing my recommendations, and documenting the visit within the medical record.     Thank you for involving me in the care of this patient.      Tamirah George K. Clois  MD, [facility] Neurosurgery

## 2023-12-12 ENCOUNTER — Ambulatory Visit: Admitting: Neurosurgery

## 2023-12-12 ENCOUNTER — Encounter: Payer: Self-pay | Admitting: Neurosurgery

## 2023-12-12 VITALS — BP 140/70 | Ht 59.0 in | Wt 126.0 lb

## 2023-12-12 DIAGNOSIS — G9589 Other specified diseases of spinal cord: Secondary | ICD-10-CM | POA: Diagnosis not present

## 2023-12-12 DIAGNOSIS — M4802 Spinal stenosis, cervical region: Secondary | ICD-10-CM

## 2023-12-13 NOTE — Therapy (Signed)
 OUTPATIENT PHYSICAL THERAPY CERVICAL EVALUATION   Patient Name: Yolanda Barker MRN: [id number] DOB:1955/07/21, 68 y.o., female Today's Date: 12/14/2023  END OF SESSION:  PT End of Session - 12/14/23 0820     Visit Number 1    Number of Visits 17    Date for Recertification  02/08/24    PT Start Time 0818    PT Stop Time 0858    PT Time Calculation (min) 40 min    Activity Tolerance Patient tolerated treatment well    Behavior During Therapy [facility] for tasks assessed/performed          Past Medical History:  Diagnosis Date   Adhesive capsulitis of right shoulder    Anemia    Anxiety    Arthritis    Diabetes mellitus without complication (HCC)    Diverticulitis    Dyspnea    GERD (gastroesophageal reflux disease)    Hypertension    IDA (iron deficiency anemia)    Injury of tendon of [place] head of right biceps    Nontraumatic incomplete tear of right rotator cuff    Rotator cuff tendinitis, right    Vertigo    Past Surgical History:  Procedure Laterality Date   ABDOMINAL HYSTERECTOMY     COLONOSCOPY WITH PROPOFOL  N/A 03/15/2016   Procedure: COLONOSCOPY WITH PROPOFOL ;  Surgeon: Gladis RAYMOND Mariner, MD;  Location: [facility] ENDOSCOPY;  Service: Endoscopy;  Laterality: N/A;   COLONOSCOPY WITH PROPOFOL  N/A 11/15/2019   Procedure: COLONOSCOPY WITH PROPOFOL ;  Surgeon: Maryruth Ole DASEN, MD;  Location: [facility] ENDOSCOPY;  Service: Gastroenterology;  Laterality: N/A;   COLONOSCOPY WITH PROPOFOL  N/A 07/26/2022   Procedure: COLONOSCOPY WITH PROPOFOL ;  Surgeon: Maryruth Ole DASEN, MD;  Location: [facility] ENDOSCOPY;  Service: Gastroenterology;  Laterality: N/A;   SHOULDER ARTHROSCOPY WITH OPEN ROTATOR CUFF REPAIR Right 11/14/2017   Procedure: SHOULDER ARTHROSCOPY WITH OPEN ROTATOR CUFF REPAIR;  Surgeon: Edie Norleen PARAS, MD;  Location: [facility] ORS;  Service: Orthopedics;  Laterality: Right;   SHOULDER CLOSED REDUCTION Right 03/13/2018   Procedure: CLOSED MANIPULATION SHOULDER;  Surgeon: Edie Norleen PARAS,  MD;  Location: [facility] ORS;  Service: Orthopedics;  Laterality: Right;   STERIOD INJECTION Right 03/13/2018   Procedure: STEROID INJECTION RIGHT SHOULDER;  Surgeon: Edie Norleen PARAS, MD;  Location: [facility] ORS;  Service: Orthopedics;  Laterality: Right;   TONSILLECTOMY     WISDOM TOOTH EXTRACTION     Patient Active Problem List   Diagnosis Date Noted   Nontraumatic incomplete tear of right rotator cuff 10/30/2017   Gastro-esophageal reflux disease without esophagitis 07/11/2012   Anxiety disorder 12/17/2008   Iron deficiency anemia 12/17/2008    PCP: [facility]  REFERRING PROVIDER: Clois Fret, MD  REFERRING DIAG:  [phone] (ICD-10-CM) - Spinal stenosis in cervical region G95.89 (ICD-10-CM) - Myelomalacia of cervical cord (HCC)  THERAPY DIAG:  Cervicalgia  Muscle weakness (generalized)  Rationale for Evaluation and Treatment: Rehabilitation  ONSET DATE: chronic  SUBJECTIVE:  SUBJECTIVE STATEMENT: Patient reports she is here for stiffness in the neck with known stenosis. She reports soreness in both shoulders and shoulder blades. Patient reports intermittent pain in her neck when she first wakes up. She has burning sensation in the forearms. Hand dominance: Right  PERTINENT HISTORY:  Per Neurosurgery MD note on 12/12/23, She has some soreness in her neck on the left side that is intermittent. She continues with intermittent burning in both arms with intermittent tingling in both hands. She has intermittent weakness in her arms. She has intermittent burning in legs as well with intermittent. No issues with walking or balance. She has cervical stenosis and myelomalacia, however asymptomatic per neurosurgery.  PMH: T2DM, HTN, anxiety, arthritis  PAIN:  Are you  having pain? Yes: NPRS scale: 0/10 - 8-9/10  Pain location: neck  Pain description: sore, aching Aggravating factors: sleeping and first getting up  Relieving factors: sports alcohol   PRECAUTIONS: None  RED FLAGS: None     WEIGHT BEARING RESTRICTIONS: No  FALLS:  Has patient fallen in last 6 months? No  LIVING ENVIRONMENT: Lives with: lives with their son Lives in: House/apartment  OCCUPATION: Retired nursing   PLOF: Independent  PATIENT GOALS: I hope it helps and relieves whatever it's supposed to be doing    OBJECTIVE:  Note: Objective measures were completed at Evaluation unless otherwise noted.  DIAGNOSTIC FINDINGS:  EXAM: MRI CERVICAL SPINE WITHOUT CONTRAST  IMPRESSION: 1. Multilevel cervical spondylosis with resultant moderate to severe diffuse spinal stenosis at C3-4 through C6-7, most pronounced at C6-7. 2. Multilevel moderate to severe bilateral foraminal narrowing at C4 through C7 as above. 3. Patchy cord signal abnormality at the level of C4-5, consistent with chronic myelomalacia.  PATIENT SURVEYS:  NDI:  NECK DISABILITY INDEX  Date: 12/14/2023 Score  Pain intensity 0 = I have no pain at the moment  2. Personal care (washing, dressing, etc.) 0 = I can look after myself normally without causing extra pain  3. Lifting 4 =  I can only lift very light weights  4. Reading 1 = I can read as much as I want to with slight pain in my neck  5. Headaches 5 = I have headaches almost all the time  6. Concentration 0 =  I can concentrate fully when I want to with no difficulty  7. Work 1 =  I can only do my usual work, but no more  8. Driving 1 =  I can drive my car as [place] as I want with slight pain in my neck  9. Sleeping 2 = My sleep is mildly disturbed (1-2 hrs sleepless)  10. Recreation 2 = I am able to engage in most, but not all of my usual recreation activities because of   pain in my neck  Total 16/50 = 32%   Minimum Detectable Change (90%  confidence): 5 points or 10% points  COGNITION: Overall cognitive status: Within functional limits for tasks assessed  SENSATION: WFL - although burning sensation and numb to touch but light touch testing normal  POSTURE: No Significant postural limitations  PALPATION: Significant tightness to B UT, levator, cervical paraspinals, and suboccipitals Trigger points noted in B UT  CERVICAL ROM:   Active ROM A/PROM (deg) eval  Flexion WFL  Extension WFL  Right lateral flexion 45  Left lateral flexion 38  Right rotation 29  Left rotation 19   (Blank rows = not tested)  UPPER EXTREMITY ROM: WFL   UPPER EXTREMITY MMT:  MMT Right eval Left eval  Shoulder flexion 4* 4*  Shoulder extension    Shoulder abduction 4* 4*  Shoulder adduction    Shoulder extension    Shoulder internal rotation    Shoulder external rotation    Middle trapezius    Lower trapezius    Elbow flexion 4 4  Elbow extension 4 4  Wrist flexion    Wrist extension    Wrist ulnar deviation    Wrist radial deviation    Wrist pronation    Wrist supination    Grip strength     (Blank rows = not tested)   Grip strength equal  CERVICAL SPECIAL TESTS:  NT - Spurling's contraindicated due to known cervical myelopathy    TREATMENT DATE: 12/14/23                                                                                                                                PATIENT EDUCATION:  Education details: HEP, POC, goals Person educated: Patient Education method: Explanation, Demonstration, and Handouts Education comprehension: verbalized understanding and returned demonstration  HOME EXERCISE PROGRAM: Access Code: [email or web address] URL: [email or web address] Date: 12/14/2023 Prepared by: Maryanne Finder  Exercises - Seated Upper Trapezius Stretch  - 2-3 x daily - 5-7 x weekly - 3-5 reps - 30-60 seconds  hold - Seated Levator Scapulae Stretch  - 2-3 x daily - 5-7 x weekly - 3-5 reps -  30-60 seconds  hold  ASSESSMENT:  CLINICAL IMPRESSION: Patient is a 68 y.o. female who was seen today for physical therapy evaluation and treatment for neck stiffness and headaches. Patient with B UE weakness, decreased activity tolerance, impaired cervical ROM, and pain/headaches. HEP initiated with upper trap and levator scapulae stretching to assist in reducing headaches. Encouraged patient to utilize heating pad as well before and/or after stretching to assist in muscle relaxation. Patient will benefit from skilled PT services to address listed impairments to improve quality of life and reduce frequency of headaches.   OBJECTIVE IMPAIRMENTS: decreased activity tolerance, decreased ROM, decreased strength, impaired flexibility, impaired sensation, postural dysfunction, and pain.   ACTIVITY LIMITATIONS: lifting, bending, and sleeping  PARTICIPATION LIMITATIONS: cleaning, laundry, and community activity  PERSONAL FACTORS: Age, Past/current experiences, and known cervical stenosis with myelomalacia are also affecting patient's functional outcome.   REHAB POTENTIAL: Good  CLINICAL DECISION MAKING: Stable/uncomplicated  EVALUATION COMPLEXITY: Low   GOALS: Goals reviewed with patient? Yes  SHORT TERM GOALS: Target date: 01/11/2024  Patient will be independent in HEP to improve strength/mobility for better functional independence with ADLs. Baseline: 12/14/2023: HEP initiated  Goal status: INITIAL   [place] TERM GOALS: Target date: 02/08/2024  Patient will reduce Neck Disability Index score to <10% to demonstrate minimal disability with ADL's including improved sleeping tolerance, sitting tolerance, etc for better mobility at home and work. Baseline: 12/14/2023: 16/50 = 32% Goal status: INITIAL  2.  Patient will improve cervical rotation and sidebending by 10 degrees  to demonstrate improved mobility and ability to complete driving safely. Baseline: 12/14/2023: see above  Goal status:  INITIAL  3.  Patient will improve BUE strength by 1/3 MMT grade without pain to demonstrate improved ability to complete daily activities and household chores. Baseline: 12/14/2023: see above  Goal status: INITIAL  4.  Patient will deny headaches within 2 week time period to demonstrate improvement in muscular tightness, specifically upper traps and levator scapulae.  Baseline: 10/2/205: frequent headaches, last on 12/13/2023 Goal status: INITIAL   PLAN:  PT FREQUENCY: 1-2x/week  PT DURATION: 8 weeks  PLANNED INTERVENTIONS: [id number]- PT Re-evaluation, 97750- Physical Performance Testing, 97110-Therapeutic exercises, 97530- Therapeutic activity, 97112- Neuromuscular re-education, 97535- Self Care, [id number]- Manual therapy, [id number]- Electrical stimulation (unattended), [phone]- Electrical stimulation (manual), 20560 (1-2 muscles), 20561 (3+ muscles)- Dry Needling, Patient/Family education, Joint mobilization, Joint manipulation, Spinal manipulation, Spinal mobilization, Cryotherapy, and Moist heat  PLAN FOR NEXT SESSION: HEP review, manual stretching/STM, scapular strengthening    Maryanne Finder, PT, DPT Physical Therapist - [place]  [facility] 12/14/2023, 10:01 AM

## 2023-12-14 ENCOUNTER — Ambulatory Visit: Attending: Neurosurgery

## 2023-12-14 DIAGNOSIS — M4802 Spinal stenosis, cervical region: Secondary | ICD-10-CM | POA: Insufficient documentation

## 2023-12-14 DIAGNOSIS — M542 Cervicalgia: Secondary | ICD-10-CM | POA: Insufficient documentation

## 2023-12-14 DIAGNOSIS — G9589 Other specified diseases of spinal cord: Secondary | ICD-10-CM | POA: Diagnosis not present

## 2023-12-14 DIAGNOSIS — M6281 Muscle weakness (generalized): Secondary | ICD-10-CM | POA: Diagnosis present

## 2023-12-18 NOTE — Therapy (Signed)
 OUTPATIENT PHYSICAL THERAPY CERVICAL TREATMENT   Patient Name: Yolanda Barker MRN: [id number] DOB:1955/12/03, 68 y.o., female Today's Date: 12/19/2023  END OF SESSION:  PT End of Session - 12/19/23 1259     Visit Number 2    Number of Visits 17    Date for Recertification  02/08/24    PT Start Time 1300    PT Stop Time 1341    PT Time Calculation (min) 41 min    Activity Tolerance Patient tolerated treatment well    Behavior During Therapy WFL for tasks assessed/performed           Past Medical History:  Diagnosis Date   Adhesive capsulitis of right shoulder    Anemia    Anxiety    Arthritis    Diabetes mellitus without complication (HCC)    Diverticulitis    Dyspnea    GERD (gastroesophageal reflux disease)    Hypertension    IDA (iron deficiency anemia)    Injury of tendon of General head of right biceps    Nontraumatic incomplete tear of right rotator cuff    Rotator cuff tendinitis, right    Vertigo    Past Surgical History:  Procedure Laterality Date   ABDOMINAL HYSTERECTOMY     COLONOSCOPY WITH PROPOFOL  N/A 03/15/2016   Procedure: COLONOSCOPY WITH PROPOFOL ;  Surgeon: Gladis RAYMOND Mariner, MD;  Location: [facility] ENDOSCOPY;  Service: Endoscopy;  Laterality: N/A;   COLONOSCOPY WITH PROPOFOL  N/A 11/15/2019   Procedure: COLONOSCOPY WITH PROPOFOL ;  Surgeon: Maryruth Ole DASEN, MD;  Location: [facility] ENDOSCOPY;  Service: Gastroenterology;  Laterality: N/A;   COLONOSCOPY WITH PROPOFOL  N/A 07/26/2022   Procedure: COLONOSCOPY WITH PROPOFOL ;  Surgeon: Maryruth Ole DASEN, MD;  Location: [facility] ENDOSCOPY;  Service: Gastroenterology;  Laterality: N/A;   SHOULDER ARTHROSCOPY WITH OPEN ROTATOR CUFF REPAIR Right 11/14/2017   Procedure: SHOULDER ARTHROSCOPY WITH OPEN ROTATOR CUFF REPAIR;  Surgeon: Edie Norleen PARAS, MD;  Location: [facility] ORS;  Service: Orthopedics;  Laterality: Right;   SHOULDER CLOSED REDUCTION Right 03/13/2018   Procedure: CLOSED MANIPULATION SHOULDER;  Surgeon: Edie Norleen PARAS,  MD;  Location: [facility] ORS;  Service: Orthopedics;  Laterality: Right;   STERIOD INJECTION Right 03/13/2018   Procedure: STEROID INJECTION RIGHT SHOULDER;  Surgeon: Edie Norleen PARAS, MD;  Location: [facility] ORS;  Service: Orthopedics;  Laterality: Right;   TONSILLECTOMY     WISDOM TOOTH EXTRACTION     Patient Active Problem List   Diagnosis Date Noted   Nontraumatic incomplete tear of right rotator cuff 10/30/2017   Gastro-esophageal reflux disease without esophagitis 07/11/2012   Anxiety disorder 12/17/2008   Iron deficiency anemia 12/17/2008    PCP: Carlin Blamer [facility]  REFERRING PROVIDER: Clois Fret, MD  REFERRING DIAG:  [phone] (ICD-10-CM) - Spinal stenosis in cervical region G95.89 (ICD-10-CM) - Myelomalacia of cervical cord (HCC)  THERAPY DIAG:  Cervicalgia  Muscle weakness (generalized)  Rationale for Evaluation and Treatment: Rehabilitation  ONSET DATE: chronic  SUBJECTIVE:  SUBJECTIVE STATEMENT: Patient reports she is here for stiffness in the neck with known stenosis. She reports soreness in both shoulders and shoulder blades. Patient reports intermittent pain in her neck when she first wakes up. She has burning sensation in the forearms. Hand dominance: Right  PERTINENT HISTORY:  Per Neurosurgery MD note on 12/12/23, She has some soreness in her neck on the left side that is intermittent. She continues with intermittent burning in both arms with intermittent tingling in both hands. She has intermittent weakness in her arms. She has intermittent burning in legs as well with intermittent. No issues with walking or balance. She has cervical stenosis and myelomalacia, however asymptomatic per neurosurgery.  PMH: T2DM, HTN, anxiety, arthritis  PAIN:  Are you  having pain? Yes: NPRS scale: 0/10 - 8-9/10  Pain location: neck  Pain description: sore, aching Aggravating factors: sleeping and first getting up  Relieving factors: sports alcohol   PRECAUTIONS: None  RED FLAGS: None     WEIGHT BEARING RESTRICTIONS: No  FALLS:  Has patient fallen in last 6 months? No  LIVING ENVIRONMENT: Lives with: lives with their son Lives in: House/apartment  OCCUPATION: Retired nursing   PLOF: Independent  PATIENT GOALS: I hope it helps and relieves whatever it's supposed to be doing    OBJECTIVE:  Note: Objective measures were completed at Evaluation unless otherwise noted.  DIAGNOSTIC FINDINGS:  EXAM: MRI CERVICAL SPINE WITHOUT CONTRAST  IMPRESSION: 1. Multilevel cervical spondylosis with resultant moderate to severe diffuse spinal stenosis at C3-4 through C6-7, most pronounced at C6-7. 2. Multilevel moderate to severe bilateral foraminal narrowing at C4 through C7 as above. 3. Patchy cord signal abnormality at the level of C4-5, consistent with chronic myelomalacia.  PATIENT SURVEYS:  NDI:  NECK DISABILITY INDEX  Date: 12/14/2023 Score  Pain intensity 0 = I have no pain at the moment  2. Personal care (washing, dressing, etc.) 0 = I can look after myself normally without causing extra pain  3. Lifting 4 =  I can only lift very light weights  4. Reading 1 = I can read as much as I want to with slight pain in my neck  5. Headaches 5 = I have headaches almost all the time  6. Concentration 0 =  I can concentrate fully when I want to with no difficulty  7. Work 1 =  I can only do my usual work, but no more  8. Driving 1 =  I can drive my car as [place] as I want with slight pain in my neck  9. Sleeping 2 = My sleep is mildly disturbed (1-2 hrs sleepless)  10. Recreation 2 = I am able to engage in most, but not all of my usual recreation activities because of   pain in my neck  Total 16/50 = 32%   Minimum Detectable Change (90%  confidence): 5 points or 10% points  COGNITION: Overall cognitive status: Within functional limits for tasks assessed  SENSATION: WFL - although burning sensation and numb to touch but light touch testing normal  POSTURE: No Significant postural limitations  PALPATION: Significant tightness to B UT, levator, cervical paraspinals, and suboccipitals Trigger points noted in B UT  CERVICAL ROM:   Active ROM A/PROM (deg) eval  Flexion WFL  Extension WFL  Right lateral flexion 45  Left lateral flexion 38  Right rotation 29  Left rotation 19   (Blank rows = not tested)  UPPER EXTREMITY ROM: WFL   UPPER EXTREMITY MMT:  MMT Right eval Left eval  Shoulder flexion 4* 4*  Shoulder extension    Shoulder abduction 4* 4*  Shoulder adduction    Shoulder extension    Shoulder internal rotation    Shoulder external rotation    Middle trapezius    Lower trapezius    Elbow flexion 4 4  Elbow extension 4 4  Wrist flexion    Wrist extension    Wrist ulnar deviation    Wrist radial deviation    Wrist pronation    Wrist supination    Grip strength     (Blank rows = not tested)   Grip strength equal  CERVICAL SPECIAL TESTS:  NT - Spurling's contraindicated due to known cervical myelopathy    TREATMENT DATE: 12/19/23                                                                                                                                Subjective: Patient reports decrease in headaches after completing the exercises. The burning and aching in her arms has improved.    Manual Therapy:  STM to suboccipitals, B cervical paraspinals, UT, levator scapulae x 11 minutes   Suboccipital release 3 x 30 seconds   Upper trap stretch with GHJ overpressure 2 x 30 seconds each side   Therapeutic Exercise:  Supine B shoulder flexion with wand + 4# AW 3 x 10   Supine chest press with wand + 4# AW 3 x 10   Standing row with GTB 3 x 10   Seated B shoulder horizontal abduction with  GTB 3 x 10   Standing B shoulder abduction with 3# DB 3 x 10   Cervical rotation R/L: 45 degrees/35 degrees    PATIENT EDUCATION:  Education details: HEP, POC, goals Person educated: Patient Education method: Explanation, Demonstration, and Handouts Education comprehension: verbalized understanding and returned demonstration  HOME EXERCISE PROGRAM: Access Code: [email or web address] URL: [email or web address] Date: 12/14/2023 Prepared by: Maryanne Finder  Exercises - Seated Upper Trapezius Stretch  - 2-3 x daily - 5-7 x weekly - 3-5 reps - 30-60 seconds  hold - Seated Levator Scapulae Stretch  - 2-3 x daily - 5-7 x weekly - 3-5 reps - 30-60 seconds  hold  ASSESSMENT:  CLINICAL IMPRESSION:   Patient seen for initial treatment following evaluation for neck pain/headaches. Session focused on manual therapy to reduce muscular tightness as well as introduction to scapular/UE strengthening. Improving since initial evaluation with self reported decrease in frequency of headaches. Improvement in cervical rotation compared to initial evaluation. Patient will benefit from skilled PT services to address listed impairments to improve quality of life and reduce frequency of headaches.   OBJECTIVE IMPAIRMENTS: decreased activity tolerance, decreased ROM, decreased strength, impaired flexibility, impaired sensation, postural dysfunction, and pain.   ACTIVITY LIMITATIONS: lifting, bending, and sleeping  PARTICIPATION LIMITATIONS: cleaning, laundry, and community activity  PERSONAL FACTORS: Age, Past/current experiences, and known cervical stenosis with myelomalacia are also  affecting patient's functional outcome.   REHAB POTENTIAL: Good  CLINICAL DECISION MAKING: Stable/uncomplicated  EVALUATION COMPLEXITY: Low   GOALS: Goals reviewed with patient? Yes  SHORT TERM GOALS: Target date: 01/11/2024  Patient will be independent in HEP to improve strength/mobility for better functional  independence with ADLs. Baseline: 12/14/2023: HEP initiated  Goal status: INITIAL   [place] TERM GOALS: Target date: 02/08/2024  Patient will reduce Neck Disability Index score to <10% to demonstrate minimal disability with ADL's including improved sleeping tolerance, sitting tolerance, etc for better mobility at home and work. Baseline: 12/14/2023: 16/50 = 32% Goal status: INITIAL  2.  Patient will improve cervical rotation and sidebending by 10 degrees to demonstrate improved mobility and ability to complete driving safely. Baseline: 12/14/2023: see above  Goal status: INITIAL  3.  Patient will improve BUE strength by 1/3 MMT grade without pain to demonstrate improved ability to complete daily activities and household chores. Baseline: 12/14/2023: see above  Goal status: INITIAL  4.  Patient will deny headaches within 2 week time period to demonstrate improvement in muscular tightness, specifically upper traps and levator scapulae.  Baseline: 10/2/205: frequent headaches, last on 12/13/2023 Goal status: INITIAL   PLAN:  PT FREQUENCY: 1-2x/week  PT DURATION: 8 weeks  PLANNED INTERVENTIONS: 97164- PT Re-evaluation, 97750- Physical Performance Testing, 97110-Therapeutic exercises, 97530- Therapeutic activity, 97112- Neuromuscular re-education, 97535- Self Care, 02859- Manual therapy, G0283- Electrical stimulation (unattended), [phone]- Electrical stimulation (manual), 20560 (1-2 muscles), 20561 (3+ muscles)- Dry Needling, Patient/Family education, Joint mobilization, Joint manipulation, Spinal manipulation, Spinal mobilization, Cryotherapy, and Moist heat  PLAN FOR NEXT SESSION: HEP review, manual stretching/STM, scapular strengthening    Maryanne Finder, PT, DPT Physical Therapist - [place]  [facility] 12/19/2023, 1:00 PM

## 2023-12-19 ENCOUNTER — Ambulatory Visit

## 2023-12-19 DIAGNOSIS — M542 Cervicalgia: Secondary | ICD-10-CM | POA: Diagnosis not present

## 2023-12-19 DIAGNOSIS — M6281 Muscle weakness (generalized): Secondary | ICD-10-CM

## 2023-12-20 NOTE — Therapy (Signed)
 OUTPATIENT PHYSICAL THERAPY CERVICAL TREATMENT   Patient Name: Yolanda Barker MRN: [id number] DOB:November 06, 1955, 68 y.o., female Today's Date: 12/21/2023  END OF SESSION:  PT End of Session - 12/21/23 0900     Visit Number 3    Number of Visits 17    Date for Recertification  02/08/24    PT Start Time 0900    PT Stop Time 0941    PT Time Calculation (min) 41 min    Activity Tolerance Patient tolerated treatment well    Behavior During Therapy WFL for tasks assessed/performed            Past Medical History:  Diagnosis Date   Adhesive capsulitis of right shoulder    Anemia    Anxiety    Arthritis    Diabetes mellitus without complication (HCC)    Diverticulitis    Dyspnea    GERD (gastroesophageal reflux disease)    Hypertension    IDA (iron deficiency anemia)    Injury of tendon of [place] head of right biceps    Nontraumatic incomplete tear of right rotator cuff    Rotator cuff tendinitis, right    Vertigo    Past Surgical History:  Procedure Laterality Date   ABDOMINAL HYSTERECTOMY     COLONOSCOPY WITH PROPOFOL  N/A 03/15/2016   Procedure: COLONOSCOPY WITH PROPOFOL ;  Surgeon: Gladis RAYMOND Mariner, MD;  Location: [facility] ENDOSCOPY;  Service: Endoscopy;  Laterality: N/A;   COLONOSCOPY WITH PROPOFOL  N/A 11/15/2019   Procedure: COLONOSCOPY WITH PROPOFOL ;  Surgeon: Maryruth Ole DASEN, MD;  Location: [facility] ENDOSCOPY;  Service: Gastroenterology;  Laterality: N/A;   COLONOSCOPY WITH PROPOFOL  N/A 07/26/2022   Procedure: COLONOSCOPY WITH PROPOFOL ;  Surgeon: Maryruth Ole DASEN, MD;  Location: [facility] ENDOSCOPY;  Service: Gastroenterology;  Laterality: N/A;   SHOULDER ARTHROSCOPY WITH OPEN ROTATOR CUFF REPAIR Right 11/14/2017   Procedure: SHOULDER ARTHROSCOPY WITH OPEN ROTATOR CUFF REPAIR;  Surgeon: Edie Norleen PARAS, MD;  Location: [facility] ORS;  Service: Orthopedics;  Laterality: Right;   SHOULDER CLOSED REDUCTION Right 03/13/2018   Procedure: CLOSED MANIPULATION SHOULDER;  Surgeon: Edie Norleen PARAS, MD;  Location: [facility] ORS;  Service: Orthopedics;  Laterality: Right;   STERIOD INJECTION Right 03/13/2018   Procedure: STEROID INJECTION RIGHT SHOULDER;  Surgeon: Edie Norleen PARAS, MD;  Location: [facility] ORS;  Service: Orthopedics;  Laterality: Right;   TONSILLECTOMY     WISDOM TOOTH EXTRACTION     Patient Active Problem List   Diagnosis Date Noted   Nontraumatic incomplete tear of right rotator cuff 10/30/2017   Gastro-esophageal reflux disease without esophagitis 07/11/2012   Anxiety disorder 12/17/2008   Iron deficiency anemia 12/17/2008    PCP: Carlin Blamer [facility]  REFERRING PROVIDER: Clois Fret, MD  REFERRING DIAG:  [phone] (ICD-10-CM) - Spinal stenosis in cervical region G95.89 (ICD-10-CM) - Myelomalacia of cervical cord (HCC)  THERAPY DIAG:  Cervicalgia  Muscle weakness (generalized)  Rationale for Evaluation and Treatment: Rehabilitation  ONSET DATE: chronic  SUBJECTIVE:  SUBJECTIVE STATEMENT: Patient reports she is here for stiffness in the neck with known stenosis. She reports soreness in both shoulders and shoulder blades. Patient reports intermittent pain in her neck when she first wakes up. She has burning sensation in the forearms. Hand dominance: Right  PERTINENT HISTORY:  Per Neurosurgery MD note on 12/12/23, She has some soreness in her neck on the left side that is intermittent. She continues with intermittent burning in both arms with intermittent tingling in both hands. She has intermittent weakness in her arms. She has intermittent burning in legs as well with intermittent. No issues with walking or balance. She has cervical stenosis and myelomalacia, however asymptomatic per neurosurgery.  PMH: T2DM, HTN, anxiety, arthritis  PAIN:  Are  you having pain? Yes: NPRS scale: 0/10 - 8-9/10  Pain location: neck  Pain description: sore, aching Aggravating factors: sleeping and first getting up  Relieving factors: sports alcohol   PRECAUTIONS: None  RED FLAGS: None     WEIGHT BEARING RESTRICTIONS: No  FALLS:  Has patient fallen in last 6 months? No  LIVING ENVIRONMENT: Lives with: lives with their son Lives in: House/apartment  OCCUPATION: Retired nursing   PLOF: Independent  PATIENT GOALS: I hope it helps and relieves whatever it's supposed to be doing    OBJECTIVE:  Note: Objective measures were completed at Evaluation unless otherwise noted.  DIAGNOSTIC FINDINGS:  EXAM: MRI CERVICAL SPINE WITHOUT CONTRAST  IMPRESSION: 1. Multilevel cervical spondylosis with resultant moderate to severe diffuse spinal stenosis at C3-4 through C6-7, most pronounced at C6-7. 2. Multilevel moderate to severe bilateral foraminal narrowing at C4 through C7 as above. 3. Patchy cord signal abnormality at the level of C4-5, consistent with chronic myelomalacia.  PATIENT SURVEYS:  NDI:  NECK DISABILITY INDEX  Date: 12/14/2023 Score  Pain intensity 0 = I have no pain at the moment  2. Personal care (washing, dressing, etc.) 0 = I can look after myself normally without causing extra pain  3. Lifting 4 =  I can only lift very light weights  4. Reading 1 = I can read as much as I want to with slight pain in my neck  5. Headaches 5 = I have headaches almost all the time  6. Concentration 0 =  I can concentrate fully when I want to with no difficulty  7. Work 1 =  I can only do my usual work, but no more  8. Driving 1 =  I can drive my car as [place] as I want with slight pain in my neck  9. Sleeping 2 = My sleep is mildly disturbed (1-2 hrs sleepless)  10. Recreation 2 = I am able to engage in most, but not all of my usual recreation activities because of   pain in my neck  Total 16/50 = 32%   Minimum Detectable Change (90%  confidence): 5 points or 10% points  COGNITION: Overall cognitive status: Within functional limits for tasks assessed  SENSATION: WFL - although burning sensation and numb to touch but light touch testing normal  POSTURE: No Significant postural limitations  PALPATION: Significant tightness to B UT, levator, cervical paraspinals, and suboccipitals Trigger points noted in B UT  CERVICAL ROM:   Active ROM A/PROM (deg) eval  Flexion WFL  Extension WFL  Right lateral flexion 45  Left lateral flexion 38  Right rotation 29  Left rotation 19   (Blank rows = not tested)  UPPER EXTREMITY ROM: WFL   UPPER EXTREMITY MMT:  MMT Right eval Left eval  Shoulder flexion 4* 4*  Shoulder extension    Shoulder abduction 4* 4*  Shoulder adduction    Shoulder extension    Shoulder internal rotation    Shoulder external rotation    Middle trapezius    Lower trapezius    Elbow flexion 4 4  Elbow extension 4 4  Wrist flexion    Wrist extension    Wrist ulnar deviation    Wrist radial deviation    Wrist pronation    Wrist supination    Grip strength     (Blank rows = not tested)   Grip strength equal  CERVICAL SPECIAL TESTS:  NT - Spurling's contraindicated due to known cervical myelopathy    TREATMENT DATE: 12/21/23                                                                                                                                 Subjective: Patient denies pain on arrival.    Manual Therapy:  STM to suboccipitals, B cervical paraspinals, UT, levator scapulae x 11 minutes   Suboccipital release 3 x 30 seconds   Upper trap stretch with GHJ overpressure 2 x 30 seconds each side   Therapeutic Exercise:  UBE (seat 7) x 5 minutes (2.5 min fwd/2.5 min bwd) at level 8-6 to improve UE strength and mobility. PT manually adjusted resistance throughout time.    Cervical SNAG with towel for cervical rotation L/R 3 x 10 second holds   Seated scapular retraction with B  shoulder ER with GTB 3 x 10   Standing row with GTB 3 x 10   Seated B shoulder horizontal abduction with GTB 3 x 10   Standing B shoulder extension with GTB 3 x 10   PATIENT EDUCATION:  Education details: HEP, POC, goals Person educated: Patient Education method: Explanation, Demonstration, and Handouts Education comprehension: verbalized understanding and returned demonstration  HOME EXERCISE PROGRAM: Access Code: [email or web address] URL: [email or web address] Date: 12/21/2023 Prepared by: Maryanne Finder  Exercises - Seated Upper Trapezius Stretch  - 2-3 x daily - 5-7 x weekly - 3-5 reps - 30-60 seconds  hold - Seated Levator Scapulae Stretch  - 2-3 x daily - 5-7 x weekly - 3-5 reps - 30-60 seconds  hold - Seated Assisted Cervical Rotation with Towel  - 1-2 x daily - 5-7 x weekly - 10 reps - 5-10 second hold - Standing Shoulder Row with Anchored Resistance  - 1-2 x daily - 5-7 x weekly - 3 sets - 10 reps - Shoulder extension with resistance - Neutral  - 1-2 x daily - 5-7 x weekly - 3 sets - 10 reps - Shoulder External Rotation and Scapular Retraction with Resistance  - 1-2 x daily - 5-7 x weekly - 3 sets - 10 reps - Standing Shoulder Horizontal Abduction with Resistance  - 1-2 x daily - 5-7 x weekly - 3 sets - 10 reps  ASSESSMENT:  CLINICAL IMPRESSION:    Continued PT POC focused on neck pain/headaches. Session focused on continued manual therapy as well as UE and scapular strengthening. Updated HEP with the above exercises and provided patient with green theraband. Patient will benefit from skilled PT services to address listed impairments to improve quality of life and reduce frequency of headaches.   OBJECTIVE IMPAIRMENTS: decreased activity tolerance, decreased ROM, decreased strength, impaired flexibility, impaired sensation, postural dysfunction, and pain.   ACTIVITY LIMITATIONS: lifting, bending, and sleeping  PARTICIPATION LIMITATIONS: cleaning, laundry, and community  activity  PERSONAL FACTORS: Age, Past/current experiences, and known cervical stenosis with myelomalacia are also affecting patient's functional outcome.   REHAB POTENTIAL: Good  CLINICAL DECISION MAKING: Stable/uncomplicated  EVALUATION COMPLEXITY: Low   GOALS: Goals reviewed with patient? Yes  SHORT TERM GOALS: Target date: 01/11/2024  Patient will be independent in HEP to improve strength/mobility for better functional independence with ADLs. Baseline: 12/14/2023: HEP initiated  Goal status: INITIAL   [place] TERM GOALS: Target date: 02/08/2024  Patient will reduce Neck Disability Index score to <10% to demonstrate minimal disability with ADL's including improved sleeping tolerance, sitting tolerance, etc for better mobility at home and work. Baseline: 12/14/2023: 16/50 = 32% Goal status: INITIAL  2.  Patient will improve cervical rotation and sidebending by 10 degrees to demonstrate improved mobility and ability to complete driving safely. Baseline: 12/14/2023: see above  Goal status: INITIAL  3.  Patient will improve BUE strength by 1/3 MMT grade without pain to demonstrate improved ability to complete daily activities and household chores. Baseline: 12/14/2023: see above  Goal status: INITIAL  4.  Patient will deny headaches within 2 week time period to demonstrate improvement in muscular tightness, specifically upper traps and levator scapulae.  Baseline: 10/2/205: frequent headaches, last on 12/13/2023 Goal status: INITIAL   PLAN:  PT FREQUENCY: 1-2x/week  PT DURATION: 8 weeks  PLANNED INTERVENTIONS: 97164- PT Re-evaluation, 97750- Physical Performance Testing, 97110-Therapeutic exercises, 97530- Therapeutic activity, 97112- Neuromuscular re-education, 97535- Self Care, 02859- Manual therapy, G0283- Electrical stimulation (unattended), [phone]- Electrical stimulation (manual), 20560 (1-2 muscles), 20561 (3+ muscles)- Dry Needling, Patient/Family education, Joint  mobilization, Joint manipulation, Spinal manipulation, Spinal mobilization, Cryotherapy, and Moist heat  PLAN FOR NEXT SESSION: HEP review, manual stretching/STM, scapular strengthening    Maryanne Finder, PT, DPT Physical Therapist - [place]  [facility] 12/21/2023, 9:00 AM

## 2023-12-21 ENCOUNTER — Ambulatory Visit

## 2023-12-21 DIAGNOSIS — M542 Cervicalgia: Secondary | ICD-10-CM | POA: Diagnosis not present

## 2023-12-21 DIAGNOSIS — M6281 Muscle weakness (generalized): Secondary | ICD-10-CM

## 2023-12-21 NOTE — Therapy (Incomplete)
 OUTPATIENT PHYSICAL THERAPY CERVICAL TREATMENT   Patient Name: Yolanda Barker MRN: [id number] DOB:Apr 13, 1955, 68 y.o., female Today's Date: 12/21/2023  END OF SESSION:      Past Medical History:  Diagnosis Date   Adhesive capsulitis of right shoulder    Anemia    Anxiety    Arthritis    Diabetes mellitus without complication (HCC)    Diverticulitis    Dyspnea    GERD (gastroesophageal reflux disease)    Hypertension    IDA (iron deficiency anemia)    Injury of tendon of Hashemi head of right biceps    Nontraumatic incomplete tear of right rotator cuff    Rotator cuff tendinitis, right    Vertigo    Past Surgical History:  Procedure Laterality Date   ABDOMINAL HYSTERECTOMY     COLONOSCOPY WITH PROPOFOL  N/A 03/15/2016   Procedure: COLONOSCOPY WITH PROPOFOL ;  Surgeon: Gladis RAYMOND Mariner, MD;  Location: [facility] ENDOSCOPY;  Service: Endoscopy;  Laterality: N/A;   COLONOSCOPY WITH PROPOFOL  N/A 11/15/2019   Procedure: COLONOSCOPY WITH PROPOFOL ;  Surgeon: Maryruth Ole DASEN, MD;  Location: [facility] ENDOSCOPY;  Service: Gastroenterology;  Laterality: N/A;   COLONOSCOPY WITH PROPOFOL  N/A 07/26/2022   Procedure: COLONOSCOPY WITH PROPOFOL ;  Surgeon: Maryruth Ole DASEN, MD;  Location: [facility] ENDOSCOPY;  Service: Gastroenterology;  Laterality: N/A;   SHOULDER ARTHROSCOPY WITH OPEN ROTATOR CUFF REPAIR Right 11/14/2017   Procedure: SHOULDER ARTHROSCOPY WITH OPEN ROTATOR CUFF REPAIR;  Surgeon: Edie Norleen PARAS, MD;  Location: [facility] ORS;  Service: Orthopedics;  Laterality: Right;   SHOULDER CLOSED REDUCTION Right 03/13/2018   Procedure: CLOSED MANIPULATION SHOULDER;  Surgeon: Edie Norleen PARAS, MD;  Location: [facility] ORS;  Service: Orthopedics;  Laterality: Right;   STERIOD INJECTION Right 03/13/2018   Procedure: STEROID INJECTION RIGHT SHOULDER;  Surgeon: Edie Norleen PARAS, MD;  Location: [facility] ORS;  Service: Orthopedics;  Laterality: Right;   TONSILLECTOMY     WISDOM TOOTH EXTRACTION     Patient Active Problem  List   Diagnosis Date Noted   Nontraumatic incomplete tear of right rotator cuff 10/30/2017   Gastro-esophageal reflux disease without esophagitis 07/11/2012   Anxiety disorder 12/17/2008   Iron deficiency anemia 12/17/2008    PCP: [facility]  REFERRING PROVIDER: Clois Fret, MD  REFERRING DIAG:  [phone] (ICD-10-CM) - Spinal stenosis in cervical region G95.89 (ICD-10-CM) - Myelomalacia of cervical cord (HCC)  THERAPY DIAG:  Cervicalgia  Muscle weakness (generalized)  Rationale for Evaluation and Treatment: Rehabilitation  ONSET DATE: chronic  SUBJECTIVE:  SUBJECTIVE STATEMENT: Patient reports she is here for stiffness in the neck with known stenosis. She reports soreness in both shoulders and shoulder blades. Patient reports intermittent pain in her neck when she first wakes up. She has burning sensation in the forearms. Hand dominance: Right  PERTINENT HISTORY:  Per Neurosurgery MD note on 12/12/23, She has some soreness in her neck on the left side that is intermittent. She continues with intermittent burning in both arms with intermittent tingling in both hands. She has intermittent weakness in her arms. She has intermittent burning in legs as well with intermittent. No issues with walking or balance. She has cervical stenosis and myelomalacia, however asymptomatic per neurosurgery.  PMH: T2DM, HTN, anxiety, arthritis  PAIN:  Are you having pain? Yes: NPRS scale: 0/10 - 8-9/10  Pain location: neck  Pain description: sore, aching Aggravating factors: sleeping and first getting up  Relieving factors: sports alcohol   PRECAUTIONS: None  RED FLAGS: None     WEIGHT BEARING RESTRICTIONS: No  FALLS:  Has patient fallen in last 6 months?  No  LIVING ENVIRONMENT: Lives with: lives with their son Lives in: House/apartment  OCCUPATION: Retired nursing   PLOF: Independent  PATIENT GOALS: I hope it helps and relieves whatever it's supposed to be doing    OBJECTIVE:  Note: Objective measures were completed at Evaluation unless otherwise noted.  DIAGNOSTIC FINDINGS:  EXAM: MRI CERVICAL SPINE WITHOUT CONTRAST  IMPRESSION: 1. Multilevel cervical spondylosis with resultant moderate to severe diffuse spinal stenosis at C3-4 through C6-7, most pronounced at C6-7. 2. Multilevel moderate to severe bilateral foraminal narrowing at C4 through C7 as above. 3. Patchy cord signal abnormality at the level of C4-5, consistent with chronic myelomalacia.  PATIENT SURVEYS:  NDI:  NECK DISABILITY INDEX  Date: 12/14/2023 Score  Pain intensity 0 = I have no pain at the moment  2. Personal care (washing, dressing, etc.) 0 = I can look after myself normally without causing extra pain  3. Lifting 4 =  I can only lift very light weights  4. Reading 1 = I can read as much as I want to with slight pain in my neck  5. Headaches 5 = I have headaches almost all the time  6. Concentration 0 =  I can concentrate fully when I want to with no difficulty  7. Work 1 =  I can only do my usual work, but no more  8. Driving 1 =  I can drive my car as [place] as I want with slight pain in my neck  9. Sleeping 2 = My sleep is mildly disturbed (1-2 hrs sleepless)  10. Recreation 2 = I am able to engage in most, but not all of my usual recreation activities because of   pain in my neck  Total 16/50 = 32%   Minimum Detectable Change (90% confidence): 5 points or 10% points  COGNITION: Overall cognitive status: Within functional limits for tasks assessed  SENSATION: WFL - although burning sensation and numb to touch but light touch testing normal  POSTURE: No Significant postural limitations  PALPATION: Significant tightness to B UT, levator,  cervical paraspinals, and suboccipitals Trigger points noted in B UT  CERVICAL ROM:   Active ROM A/PROM (deg) eval  Flexion WFL  Extension WFL  Right lateral flexion 45  Left lateral flexion 38  Right rotation 29  Left rotation 19   (Blank rows = not tested)  UPPER EXTREMITY ROM: WFL   UPPER EXTREMITY MMT:  MMT Right eval Left eval  Shoulder flexion 4* 4*  Shoulder extension    Shoulder abduction 4* 4*  Shoulder adduction    Shoulder extension    Shoulder internal rotation    Shoulder external rotation    Middle trapezius    Lower trapezius    Elbow flexion 4 4  Elbow extension 4 4  Wrist flexion    Wrist extension    Wrist ulnar deviation    Wrist radial deviation    Wrist pronation    Wrist supination    Grip strength     (Blank rows = not tested)   Grip strength equal  CERVICAL SPECIAL TESTS:  NT - Spurling's contraindicated due to known cervical myelopathy    TREATMENT DATE: 12/21/23         ***                                                                                                                        Subjective: Patient denies pain on arrival.    Manual Therapy:  STM to suboccipitals, B cervical paraspinals, UT, levator scapulae x 11 minutes   Suboccipital release 3 x 30 seconds   Upper trap stretch with GHJ overpressure 2 x 30 seconds each side   Therapeutic Exercise:  UBE (seat 7) x 5 minutes (2.5 min fwd/2.5 min bwd) at level 8-6 to improve UE strength and mobility. PT manually adjusted resistance throughout time.    Cervical SNAG with towel for cervical rotation L/R 3 x 10 second holds   Seated scapular retraction with B shoulder ER with GTB 3 x 10   Standing row with GTB 3 x 10   Seated B shoulder horizontal abduction with GTB 3 x 10   Standing B shoulder extension with GTB 3 x 10   PATIENT EDUCATION:  Education details: HEP, POC, goals Person educated: Patient Education method: Explanation, Demonstration, and  Handouts Education comprehension: verbalized understanding and returned demonstration  HOME EXERCISE PROGRAM: Access Code: [email or web address] URL: [email or web address] Date: 12/21/2023 Prepared by: Maryanne Finder  Exercises - Seated Upper Trapezius Stretch  - 2-3 x daily - 5-7 x weekly - 3-5 reps - 30-60 seconds  hold - Seated Levator Scapulae Stretch  - 2-3 x daily - 5-7 x weekly - 3-5 reps - 30-60 seconds  hold - Seated Assisted Cervical Rotation with Towel  - 1-2 x daily - 5-7 x weekly - 10 reps - 5-10 second hold - Standing Shoulder Row with Anchored Resistance  - 1-2 x daily - 5-7 x weekly - 3 sets - 10 reps - Shoulder extension with resistance - Neutral  - 1-2 x daily - 5-7 x weekly - 3 sets - 10 reps - Shoulder External Rotation and Scapular Retraction with Resistance  - 1-2 x daily - 5-7 x weekly - 3 sets - 10 reps - Standing Shoulder Horizontal Abduction with Resistance  - 1-2 x daily - 5-7 x weekly - 3 sets - 10 reps  ASSESSMENT:  CLINICAL IMPRESSION:    *** Continued PT POC focused on neck pain/headaches. Session focused on continued manual therapy as well as UE and scapular strengthening. Updated HEP with the above exercises and provided patient with green theraband. Patient will benefit from skilled PT services to address listed impairments to improve quality of life and reduce frequency of headaches.   OBJECTIVE IMPAIRMENTS: decreased activity tolerance, decreased ROM, decreased strength, impaired flexibility, impaired sensation, postural dysfunction, and pain.   ACTIVITY LIMITATIONS: lifting, bending, and sleeping  PARTICIPATION LIMITATIONS: cleaning, laundry, and community activity  PERSONAL FACTORS: Age, Past/current experiences, and known cervical stenosis with myelomalacia are also affecting patient's functional outcome.   REHAB POTENTIAL: Good  CLINICAL DECISION MAKING: Stable/uncomplicated  EVALUATION COMPLEXITY: Low   GOALS: Goals reviewed with  patient? Yes  SHORT TERM GOALS: Target date: 01/11/2024  Patient will be independent in HEP to improve strength/mobility for better functional independence with ADLs. Baseline: 12/14/2023: HEP initiated  Goal status: INITIAL   [place] TERM GOALS: Target date: 02/08/2024  Patient will reduce Neck Disability Index score to <10% to demonstrate minimal disability with ADL's including improved sleeping tolerance, sitting tolerance, etc for better mobility at home and work. Baseline: 12/14/2023: 16/50 = 32% Goal status: INITIAL  2.  Patient will improve cervical rotation and sidebending by 10 degrees to demonstrate improved mobility and ability to complete driving safely. Baseline: 12/14/2023: see above  Goal status: INITIAL  3.  Patient will improve BUE strength by 1/3 MMT grade without pain to demonstrate improved ability to complete daily activities and household chores. Baseline: 12/14/2023: see above  Goal status: INITIAL  4.  Patient will deny headaches within 2 week time period to demonstrate improvement in muscular tightness, specifically upper traps and levator scapulae.  Baseline: 10/2/205: frequent headaches, last on 12/13/2023 Goal status: INITIAL   PLAN:  PT FREQUENCY: 1-2x/week  PT DURATION: 8 weeks  PLANNED INTERVENTIONS: 97164- PT Re-evaluation, 97750- Physical Performance Testing, 97110-Therapeutic exercises, 97530- Therapeutic activity, 97112- Neuromuscular re-education, 97535- Self Care, 02859- Manual therapy, G0283- Electrical stimulation (unattended), [phone]- Electrical stimulation (manual), 20560 (1-2 muscles), 20561 (3+ muscles)- Dry Needling, Patient/Family education, Joint mobilization, Joint manipulation, Spinal manipulation, Spinal mobilization, Cryotherapy, and Moist heat  PLAN FOR NEXT SESSION: HEP review, manual stretching/STM, scapular strengthening    Maryanne Finder, PT, DPT Physical Therapist - [place]  [facility] 12/21/2023, 4:32  PM

## 2023-12-25 ENCOUNTER — Ambulatory Visit

## 2023-12-25 DIAGNOSIS — M6281 Muscle weakness (generalized): Secondary | ICD-10-CM

## 2023-12-25 DIAGNOSIS — M542 Cervicalgia: Secondary | ICD-10-CM

## 2023-12-26 NOTE — Therapy (Signed)
 OUTPATIENT PHYSICAL THERAPY CERVICAL TREATMENT   Patient Name: Yolanda Barker MRN: [id number] DOB:1955-05-17, 68 y.o., female Today's Date: 12/27/2023  END OF SESSION:  PT End of Session - 12/27/23 1302     Visit Number 4    Number of Visits 17    Date for Recertification  02/08/24    PT Start Time 1300    PT Stop Time 1341    PT Time Calculation (min) 41 min    Activity Tolerance Patient tolerated treatment well    Behavior During Therapy WFL for tasks assessed/performed             Past Medical History:  Diagnosis Date   Adhesive capsulitis of right shoulder    Anemia    Anxiety    Arthritis    Diabetes mellitus without complication (HCC)    Diverticulitis    Dyspnea    GERD (gastroesophageal reflux disease)    Hypertension    IDA (iron deficiency anemia)    Injury of tendon of Roen head of right biceps    Nontraumatic incomplete tear of right rotator cuff    Rotator cuff tendinitis, right    Vertigo    Past Surgical History:  Procedure Laterality Date   ABDOMINAL HYSTERECTOMY     COLONOSCOPY WITH PROPOFOL  N/A 03/15/2016   Procedure: COLONOSCOPY WITH PROPOFOL ;  Surgeon: Gladis RAYMOND Mariner, MD;  Location: [facility] ENDOSCOPY;  Service: Endoscopy;  Laterality: N/A;   COLONOSCOPY WITH PROPOFOL  N/A 11/15/2019   Procedure: COLONOSCOPY WITH PROPOFOL ;  Surgeon: Maryruth Ole DASEN, MD;  Location: [facility] ENDOSCOPY;  Service: Gastroenterology;  Laterality: N/A;   COLONOSCOPY WITH PROPOFOL  N/A 07/26/2022   Procedure: COLONOSCOPY WITH PROPOFOL ;  Surgeon: Maryruth Ole DASEN, MD;  Location: [facility] ENDOSCOPY;  Service: Gastroenterology;  Laterality: N/A;   SHOULDER ARTHROSCOPY WITH OPEN ROTATOR CUFF REPAIR Right 11/14/2017   Procedure: SHOULDER ARTHROSCOPY WITH OPEN ROTATOR CUFF REPAIR;  Surgeon: Edie Norleen PARAS, MD;  Location: [facility] ORS;  Service: Orthopedics;  Laterality: Right;   SHOULDER CLOSED REDUCTION Right 03/13/2018   Procedure: CLOSED MANIPULATION SHOULDER;  Surgeon: Edie Norleen PARAS, MD;  Location: [facility] ORS;  Service: Orthopedics;  Laterality: Right;   STERIOD INJECTION Right 03/13/2018   Procedure: STEROID INJECTION RIGHT SHOULDER;  Surgeon: Edie Norleen PARAS, MD;  Location: [facility] ORS;  Service: Orthopedics;  Laterality: Right;   TONSILLECTOMY     WISDOM TOOTH EXTRACTION     Patient Active Problem List   Diagnosis Date Noted   Nontraumatic incomplete tear of right rotator cuff 10/30/2017   Gastro-esophageal reflux disease without esophagitis 07/11/2012   Anxiety disorder 12/17/2008   Iron deficiency anemia 12/17/2008    PCP: [facility]  REFERRING PROVIDER: Clois Fret, MD  REFERRING DIAG:  [phone] (ICD-10-CM) - Spinal stenosis in cervical region G95.89 (ICD-10-CM) - Myelomalacia of cervical cord (HCC)  THERAPY DIAG:  Muscle weakness (generalized)  Cervicalgia  Rationale for Evaluation and Treatment: Rehabilitation  ONSET DATE: chronic  SUBJECTIVE:  SUBJECTIVE STATEMENT: Patient reports she is here for stiffness in the neck with known stenosis. She reports soreness in both shoulders and shoulder blades. Patient reports intermittent pain in her neck when she first wakes up. She has burning sensation in the forearms. Hand dominance: Right  PERTINENT HISTORY:  Per Neurosurgery MD note on 12/12/23, She has some soreness in her neck on the left side that is intermittent. She continues with intermittent burning in both arms with intermittent tingling in both hands. She has intermittent weakness in her arms. She has intermittent burning in legs as well with intermittent. No issues with walking or balance. She has cervical stenosis and myelomalacia, however asymptomatic per neurosurgery.  PMH: T2DM, HTN, anxiety, arthritis  PAIN:   Are you having pain? Yes: NPRS scale: 0/10 - 8-9/10  Pain location: neck  Pain description: sore, aching Aggravating factors: sleeping and first getting up  Relieving factors: sports alcohol   PRECAUTIONS: None  RED FLAGS: None     WEIGHT BEARING RESTRICTIONS: No  FALLS:  Has patient fallen in last 6 months? No  LIVING ENVIRONMENT: Lives with: lives with their son Lives in: House/apartment  OCCUPATION: Retired nursing   PLOF: Independent  PATIENT GOALS: I hope it helps and relieves whatever it's supposed to be doing    OBJECTIVE:  Note: Objective measures were completed at Evaluation unless otherwise noted.  DIAGNOSTIC FINDINGS:  EXAM: MRI CERVICAL SPINE WITHOUT CONTRAST  IMPRESSION: 1. Multilevel cervical spondylosis with resultant moderate to severe diffuse spinal stenosis at C3-4 through C6-7, most pronounced at C6-7. 2. Multilevel moderate to severe bilateral foraminal narrowing at C4 through C7 as above. 3. Patchy cord signal abnormality at the level of C4-5, consistent with chronic myelomalacia.  PATIENT SURVEYS:  NDI:  NECK DISABILITY INDEX  Date: 12/14/2023 Score  Pain intensity 0 = I have no pain at the moment  2. Personal care (washing, dressing, etc.) 0 = I can look after myself normally without causing extra pain  3. Lifting 4 =  I can only lift very light weights  4. Reading 1 = I can read as much as I want to with slight pain in my neck  5. Headaches 5 = I have headaches almost all the time  6. Concentration 0 =  I can concentrate fully when I want to with no difficulty  7. Work 1 =  I can only do my usual work, but no more  8. Driving 1 =  I can drive my car as [place] as I want with slight pain in my neck  9. Sleeping 2 = My sleep is mildly disturbed (1-2 hrs sleepless)  10. Recreation 2 = I am able to engage in most, but not all of my usual recreation activities because of   pain in my neck  Total 16/50 = 32%   Minimum Detectable Change  (90% confidence): 5 points or 10% points  COGNITION: Overall cognitive status: Within functional limits for tasks assessed  SENSATION: WFL - although burning sensation and numb to touch but light touch testing normal  POSTURE: No Significant postural limitations  PALPATION: Significant tightness to B UT, levator, cervical paraspinals, and suboccipitals Trigger points noted in B UT  CERVICAL ROM:   Active ROM A/PROM (deg) eval  Flexion WFL  Extension WFL  Right lateral flexion 45  Left lateral flexion 38  Right rotation 29  Left rotation 19   (Blank rows = not tested)  UPPER EXTREMITY ROM: WFL   UPPER EXTREMITY MMT:  MMT Right eval Left eval  Shoulder flexion 4* 4*  Shoulder extension    Shoulder abduction 4* 4*  Shoulder adduction    Shoulder extension    Shoulder internal rotation    Shoulder external rotation    Middle trapezius    Lower trapezius    Elbow flexion 4 4  Elbow extension 4 4  Wrist flexion    Wrist extension    Wrist ulnar deviation    Wrist radial deviation    Wrist pronation    Wrist supination    Grip strength     (Blank rows = not tested)   Grip strength equal  CERVICAL SPECIAL TESTS:  NT - Spurling's contraindicated due to known cervical myelopathy    TREATMENT DATE: 12/27/23                                                                                                                                Subjective: Patient denies pain on arrival but feels sore. She has occasional headaches but does the stretches listed below in HEP section and the headache goes away.    Manual Therapy:  STM to suboccipitals, B cervical paraspinals, UT, levator scapulae x 11 minutes   Suboccipital release 3 x 30 seconds   Upper trap stretch with GHJ overpressure 2 x 30 seconds each side   Therapeutic Exercise:  UBE (seat 7) x 5 minutes (2.5 min fwd/2.5 min bwd) at level 8-6 to improve UE strength and mobility. PT manually adjusted resistance  throughout time.    B shoulder abduction with 3# DB 3 x 10   B shoulder flexion 3# DB 3 x 10    OMEGA   Seated row 10# - 3 x 10   Seated chest press 10# - 3 x 10    Standing face pulls 5# - 2 x 10   PATIENT EDUCATION:  Education details: HEP, POC, goals Person educated: Patient Education method: Explanation, Demonstration, and Handouts Education comprehension: verbalized understanding and returned demonstration  HOME EXERCISE PROGRAM: Access Code: [email or web address] URL: [email or web address] Date: 12/21/2023 Prepared by: Maryanne Finder  Exercises - Seated Upper Trapezius Stretch  - 2-3 x daily - 5-7 x weekly - 3-5 reps - 30-60 seconds  hold - Seated Levator Scapulae Stretch  - 2-3 x daily - 5-7 x weekly - 3-5 reps - 30-60 seconds  hold - Seated Assisted Cervical Rotation with Towel  - 1-2 x daily - 5-7 x weekly - 10 reps - 5-10 second hold - Standing Shoulder Row with Anchored Resistance  - 1-2 x daily - 5-7 x weekly - 3 sets - 10 reps - Shoulder extension with resistance - Neutral  - 1-2 x daily - 5-7 x weekly - 3 sets - 10 reps - Shoulder External Rotation and Scapular Retraction with Resistance  - 1-2 x daily - 5-7 x weekly - 3 sets - 10 reps - Standing Shoulder Horizontal Abduction with Resistance  -  1-2 x daily - 5-7 x weekly - 3 sets - 10 reps  ASSESSMENT:  CLINICAL IMPRESSION:    Continued PT POC focused on neck pain/headaches. Session focused on continued manual therapy as well as UE and scapular strengthening with increased resistance. Tolerated session well with decrease in muscular tightness and pain. Patient will benefit from skilled PT services to address listed impairments to improve quality of life and reduce frequency of headaches.   OBJECTIVE IMPAIRMENTS: decreased activity tolerance, decreased ROM, decreased strength, impaired flexibility, impaired sensation, postural dysfunction, and pain.   ACTIVITY LIMITATIONS: lifting, bending, and  sleeping  PARTICIPATION LIMITATIONS: cleaning, laundry, and community activity  PERSONAL FACTORS: Age, Past/current experiences, and known cervical stenosis with myelomalacia are also affecting patient's functional outcome.   REHAB POTENTIAL: Good  CLINICAL DECISION MAKING: Stable/uncomplicated  EVALUATION COMPLEXITY: Low   GOALS: Goals reviewed with patient? Yes  SHORT TERM GOALS: Target date: 01/11/2024  Patient will be independent in HEP to improve strength/mobility for better functional independence with ADLs. Baseline: 12/14/2023: HEP initiated  Goal status: INITIAL   [place] TERM GOALS: Target date: 02/08/2024  Patient will reduce Neck Disability Index score to <10% to demonstrate minimal disability with ADL's including improved sleeping tolerance, sitting tolerance, etc for better mobility at home and work. Baseline: 12/14/2023: 16/50 = 32% Goal status: INITIAL  2.  Patient will improve cervical rotation and sidebending by 10 degrees to demonstrate improved mobility and ability to complete driving safely. Baseline: 12/14/2023: see above  Goal status: INITIAL  3.  Patient will improve BUE strength by 1/3 MMT grade without pain to demonstrate improved ability to complete daily activities and household chores. Baseline: 12/14/2023: see above  Goal status: INITIAL  4.  Patient will deny headaches within 2 week time period to demonstrate improvement in muscular tightness, specifically upper traps and levator scapulae.  Baseline: 10/2/205: frequent headaches, last on 12/13/2023 Goal status: INITIAL   PLAN:  PT FREQUENCY: 1-2x/week  PT DURATION: 8 weeks  PLANNED INTERVENTIONS: 97164- PT Re-evaluation, 97750- Physical Performance Testing, 97110-Therapeutic exercises, 97530- Therapeutic activity, 97112- Neuromuscular re-education, 97535- Self Care, 02859- Manual therapy, G0283- Electrical stimulation (unattended), [phone]- Electrical stimulation (manual), 20560 (1-2 muscles),  20561 (3+ muscles)- Dry Needling, Patient/Family education, Joint mobilization, Joint manipulation, Spinal manipulation, Spinal mobilization, Cryotherapy, and Moist heat  PLAN FOR NEXT SESSION: HEP review, manual stretching/STM, scapular strengthening    Maryanne Finder, PT, DPT Physical Therapist - [place]  [facility] 12/27/2023, 1:02 PM

## 2023-12-27 ENCOUNTER — Ambulatory Visit

## 2023-12-27 DIAGNOSIS — M6281 Muscle weakness (generalized): Secondary | ICD-10-CM

## 2023-12-27 DIAGNOSIS — M542 Cervicalgia: Secondary | ICD-10-CM

## 2024-01-01 NOTE — Therapy (Signed)
 OUTPATIENT PHYSICAL THERAPY CERVICAL TREATMENT   Patient Name: Yolanda Barker MRN: [id number] DOB:Aug 20, 1955, 68 y.o., female Today's Date: 01/02/2024  END OF SESSION:  PT End of Session - 01/02/24 1112     Visit Number 5    Number of Visits 17    Date for Recertification  02/08/24    PT Start Time 1111    PT Stop Time 1151    PT Time Calculation (min) 40 min    Activity Tolerance Patient tolerated treatment well    Behavior During Therapy WFL for tasks assessed/performed              Past Medical History:  Diagnosis Date   Adhesive capsulitis of right shoulder    Anemia    Anxiety    Arthritis    Diabetes mellitus without complication (HCC)    Diverticulitis    Dyspnea    GERD (gastroesophageal reflux disease)    Hypertension    IDA (iron deficiency anemia)    Injury of tendon of Liese head of right biceps    Nontraumatic incomplete tear of right rotator cuff    Rotator cuff tendinitis, right    Vertigo    Past Surgical History:  Procedure Laterality Date   ABDOMINAL HYSTERECTOMY     COLONOSCOPY WITH PROPOFOL  N/A 03/15/2016   Procedure: COLONOSCOPY WITH PROPOFOL ;  Surgeon: Gladis RAYMOND Mariner, MD;  Location: [facility] ENDOSCOPY;  Service: Endoscopy;  Laterality: N/A;   COLONOSCOPY WITH PROPOFOL  N/A 11/15/2019   Procedure: COLONOSCOPY WITH PROPOFOL ;  Surgeon: Maryruth Ole DASEN, MD;  Location: [facility] ENDOSCOPY;  Service: Gastroenterology;  Laterality: N/A;   COLONOSCOPY WITH PROPOFOL  N/A 07/26/2022   Procedure: COLONOSCOPY WITH PROPOFOL ;  Surgeon: Maryruth Ole DASEN, MD;  Location: [facility] ENDOSCOPY;  Service: Gastroenterology;  Laterality: N/A;   SHOULDER ARTHROSCOPY WITH OPEN ROTATOR CUFF REPAIR Right 11/14/2017   Procedure: SHOULDER ARTHROSCOPY WITH OPEN ROTATOR CUFF REPAIR;  Surgeon: Edie Norleen PARAS, MD;  Location: [facility] ORS;  Service: Orthopedics;  Laterality: Right;   SHOULDER CLOSED REDUCTION Right 03/13/2018   Procedure: CLOSED MANIPULATION SHOULDER;  Surgeon: Edie Norleen PARAS, MD;  Location: [facility] ORS;  Service: Orthopedics;  Laterality: Right;   STERIOD INJECTION Right 03/13/2018   Procedure: STEROID INJECTION RIGHT SHOULDER;  Surgeon: Edie Norleen PARAS, MD;  Location: [facility] ORS;  Service: Orthopedics;  Laterality: Right;   TONSILLECTOMY     WISDOM TOOTH EXTRACTION     Patient Active Problem List   Diagnosis Date Noted   Nontraumatic incomplete tear of right rotator cuff 10/30/2017   Gastro-esophageal reflux disease without esophagitis 07/11/2012   Anxiety disorder 12/17/2008   Iron deficiency anemia 12/17/2008    PCP: Carlin Blamer [facility]  REFERRING PROVIDER: Clois Fret, MD  REFERRING DIAG:  [phone] (ICD-10-CM) - Spinal stenosis in cervical region G95.89 (ICD-10-CM) - Myelomalacia of cervical cord (HCC)  THERAPY DIAG:  Muscle weakness (generalized)  Cervicalgia  Rationale for Evaluation and Treatment: Rehabilitation  ONSET DATE: chronic  SUBJECTIVE:  SUBJECTIVE STATEMENT: Patient reports she is here for stiffness in the neck with known stenosis. She reports soreness in both shoulders and shoulder blades. Patient reports intermittent pain in her neck when she first wakes up. She has burning sensation in the forearms. Hand dominance: Right  PERTINENT HISTORY:  Per Neurosurgery MD note on 12/12/23, She has some soreness in her neck on the left side that is intermittent. She continues with intermittent burning in both arms with intermittent tingling in both hands. She has intermittent weakness in her arms. She has intermittent burning in legs as well with intermittent. No issues with walking or balance. She has cervical stenosis and myelomalacia, however asymptomatic per neurosurgery.  PMH: T2DM, HTN, anxiety, arthritis  PAIN:   Are you having pain? Yes: NPRS scale: 0/10 - 8-9/10  Pain location: neck  Pain description: sore, aching Aggravating factors: sleeping and first getting up  Relieving factors: sports alcohol   PRECAUTIONS: None  RED FLAGS: None     WEIGHT BEARING RESTRICTIONS: No  FALLS:  Has patient fallen in last 6 months? No  LIVING ENVIRONMENT: Lives with: lives with their son Lives in: House/apartment  OCCUPATION: Retired nursing   PLOF: Independent  PATIENT GOALS: I hope it helps and relieves whatever it's supposed to be doing    OBJECTIVE:  Note: Objective measures were completed at Evaluation unless otherwise noted.  DIAGNOSTIC FINDINGS:  EXAM: MRI CERVICAL SPINE WITHOUT CONTRAST  IMPRESSION: 1. Multilevel cervical spondylosis with resultant moderate to severe diffuse spinal stenosis at C3-4 through C6-7, most pronounced at C6-7. 2. Multilevel moderate to severe bilateral foraminal narrowing at C4 through C7 as above. 3. Patchy cord signal abnormality at the level of C4-5, consistent with chronic myelomalacia.  PATIENT SURVEYS:  NDI:  NECK DISABILITY INDEX  Date: 12/14/2023 Score  Pain intensity 0 = I have no pain at the moment  2. Personal care (washing, dressing, etc.) 0 = I can look after myself normally without causing extra pain  3. Lifting 4 =  I can only lift very light weights  4. Reading 1 = I can read as much as I want to with slight pain in my neck  5. Headaches 5 = I have headaches almost all the time  6. Concentration 0 =  I can concentrate fully when I want to with no difficulty  7. Work 1 =  I can only do my usual work, but no more  8. Driving 1 =  I can drive my car as [place] as I want with slight pain in my neck  9. Sleeping 2 = My sleep is mildly disturbed (1-2 hrs sleepless)  10. Recreation 2 = I am able to engage in most, but not all of my usual recreation activities because of   pain in my neck  Total 16/50 = 32%   Minimum Detectable Change  (90% confidence): 5 points or 10% points  COGNITION: Overall cognitive status: Within functional limits for tasks assessed  SENSATION: WFL - although burning sensation and numb to touch but light touch testing normal  POSTURE: No Significant postural limitations  PALPATION: Significant tightness to B UT, levator, cervical paraspinals, and suboccipitals Trigger points noted in B UT  CERVICAL ROM:   Active ROM A/PROM (deg) eval  Flexion WFL  Extension WFL  Right lateral flexion 45  Left lateral flexion 38  Right rotation 29  Left rotation 19   (Blank rows = not tested)  UPPER EXTREMITY ROM: WFL   UPPER EXTREMITY MMT:  MMT Right eval Left eval  Shoulder flexion 4* 4*  Shoulder extension    Shoulder abduction 4* 4*  Shoulder adduction    Shoulder extension    Shoulder internal rotation    Shoulder external rotation    Middle trapezius    Lower trapezius    Elbow flexion 4 4  Elbow extension 4 4  Wrist flexion    Wrist extension    Wrist ulnar deviation    Wrist radial deviation    Wrist pronation    Wrist supination    Grip strength     (Blank rows = not tested)   Grip strength equal  CERVICAL SPECIAL TESTS:  NT - Spurling's contraindicated due to known cervical myelopathy    TREATMENT DATE: 01/02/24                                                                                                                                 Subjective: Patient reports feeling good this date. Her headaches are becoming more infrequent and when she does feel one coming on, she performs her stretches and they go away. Denies pain on arrival.  Therapeutic Exercise:  UBE (seat 7) x 5 minutes (2.5 min fwd/2.5 min bwd) at level 8-6 to improve UE strength and mobility. PT manually adjusted resistance throughout time.    B shoulder abduction with 3# DB 3 x 10   B shoulder flexion 3# DB 3 x 10    OMEGA   Seated row 10# - 3 x 10   Seated chest press 10# - 3 x 10    Seated  lat pull down 10# - 3 x 10    Standing face pulls 5# - 2 x 10    Wall pushup with plus 3 x 10    Standing B shoulder extension with BTB 3 x 10  Seated B shoulder horizontal abduction with BTB 3 x 10    PATIENT EDUCATION:  Education details: HEP, POC, goals Person educated: Patient Education method: Explanation, Demonstration, and Handouts Education comprehension: verbalized understanding and returned demonstration  HOME EXERCISE PROGRAM: Access Code: [email or web address] URL: [email or web address] Date: 12/21/2023 Prepared by: Maryanne Finder  Exercises - Seated Upper Trapezius Stretch  - 2-3 x daily - 5-7 x weekly - 3-5 reps - 30-60 seconds  hold - Seated Levator Scapulae Stretch  - 2-3 x daily - 5-7 x weekly - 3-5 reps - 30-60 seconds  hold - Seated Assisted Cervical Rotation with Towel  - 1-2 x daily - 5-7 x weekly - 10 reps - 5-10 second hold - Standing Shoulder Row with Anchored Resistance  - 1-2 x daily - 5-7 x weekly - 3 sets - 10 reps - Shoulder extension with resistance - Neutral  - 1-2 x daily - 5-7 x weekly - 3 sets - 10 reps - Shoulder External Rotation and Scapular Retraction with Resistance  - 1-2 x daily - 5-7 x weekly - 3 sets -  10 reps - Standing Shoulder Horizontal Abduction with Resistance  - 1-2 x daily - 5-7 x weekly - 3 sets - 10 reps  ASSESSMENT:  CLINICAL IMPRESSION:     Continued PT POC focused on neck pain/headaches. Session focused on BUE and scapular strengthening with resistance. Tolerated session well with no increase in pain. Patient will benefit from skilled PT services to address listed impairments to improve quality of life and reduce frequency of headaches.   OBJECTIVE IMPAIRMENTS: decreased activity tolerance, decreased ROM, decreased strength, impaired flexibility, impaired sensation, postural dysfunction, and pain.   ACTIVITY LIMITATIONS: lifting, bending, and sleeping  PARTICIPATION LIMITATIONS: cleaning, laundry, and community  activity  PERSONAL FACTORS: Age, Past/current experiences, and known cervical stenosis with myelomalacia are also affecting patient's functional outcome.   REHAB POTENTIAL: Good  CLINICAL DECISION MAKING: Stable/uncomplicated  EVALUATION COMPLEXITY: Low   GOALS: Goals reviewed with patient? Yes  SHORT TERM GOALS: Target date: 01/11/2024  Patient will be independent in HEP to improve strength/mobility for better functional independence with ADLs. Baseline: 12/14/2023: HEP initiated  Goal status: INITIAL   [place] TERM GOALS: Target date: 02/08/2024  Patient will reduce Neck Disability Index score to <10% to demonstrate minimal disability with ADL's including improved sleeping tolerance, sitting tolerance, etc for better mobility at home and work. Baseline: 12/14/2023: 16/50 = 32% Goal status: INITIAL  2.  Patient will improve cervical rotation and sidebending by 10 degrees to demonstrate improved mobility and ability to complete driving safely. Baseline: 12/14/2023: see above  Goal status: INITIAL  3.  Patient will improve BUE strength by 1/3 MMT grade without pain to demonstrate improved ability to complete daily activities and household chores. Baseline: 12/14/2023: see above  Goal status: INITIAL  4.  Patient will deny headaches within 2 week time period to demonstrate improvement in muscular tightness, specifically upper traps and levator scapulae.  Baseline: 10/2/205: frequent headaches, last on 12/13/2023 Goal status: INITIAL   PLAN:  PT FREQUENCY: 1-2x/week  PT DURATION: 8 weeks  PLANNED INTERVENTIONS: 97164- PT Re-evaluation, 97750- Physical Performance Testing, 97110-Therapeutic exercises, 97530- Therapeutic activity, 97112- Neuromuscular re-education, 97535- Self Care, 02859- Manual therapy, [id number]- Electrical stimulation (unattended), [phone]- Electrical stimulation (manual), 20560 (1-2 muscles), 20561 (3+ muscles)- Dry Needling, Patient/Family education, Joint  mobilization, Joint manipulation, Spinal manipulation, Spinal mobilization, Cryotherapy, and Moist heat  PLAN FOR NEXT SESSION: HEP review, manual stretching/STM, scapular strengthening    Maryanne Finder, PT, DPT Physical Therapist -   [facility] 01/02/2024, 11:50 AM

## 2024-01-02 ENCOUNTER — Ambulatory Visit

## 2024-01-02 DIAGNOSIS — M6281 Muscle weakness (generalized): Secondary | ICD-10-CM

## 2024-01-02 DIAGNOSIS — M542 Cervicalgia: Secondary | ICD-10-CM | POA: Diagnosis not present

## 2024-01-04 ENCOUNTER — Ambulatory Visit

## 2024-01-04 DIAGNOSIS — M542 Cervicalgia: Secondary | ICD-10-CM

## 2024-01-04 DIAGNOSIS — M6281 Muscle weakness (generalized): Secondary | ICD-10-CM

## 2024-01-08 ENCOUNTER — Ambulatory Visit

## 2024-01-08 DIAGNOSIS — M542 Cervicalgia: Secondary | ICD-10-CM | POA: Diagnosis not present

## 2024-01-08 DIAGNOSIS — M6281 Muscle weakness (generalized): Secondary | ICD-10-CM

## 2024-01-08 NOTE — Therapy (Signed)
 OUTPATIENT PHYSICAL THERAPY CERVICAL TREATMENT   Patient Name: Yolanda Barker MRN: [id number] DOB:December 22, 1955, 68 y.o., female Today's Date: 01/08/2024  END OF SESSION:  PT End of Session - 01/08/24 0952     Visit Number 6    Number of Visits 17    Date for Recertification  02/08/24    PT Start Time 0952    PT Stop Time 1030    PT Time Calculation (min) 38 min    Activity Tolerance Patient tolerated treatment well    Behavior During Therapy WFL for tasks assessed/performed              Past Medical History:  Diagnosis Date   Adhesive capsulitis of right shoulder    Anemia    Anxiety    Arthritis    Diabetes mellitus without complication (HCC)    Diverticulitis    Dyspnea    GERD (gastroesophageal reflux disease)    Hypertension    IDA (iron deficiency anemia)    Injury of tendon of Mordecai head of right biceps    Nontraumatic incomplete tear of right rotator cuff    Rotator cuff tendinitis, right    Vertigo    Past Surgical History:  Procedure Laterality Date   ABDOMINAL HYSTERECTOMY     COLONOSCOPY WITH PROPOFOL  N/A 03/15/2016   Procedure: COLONOSCOPY WITH PROPOFOL ;  Surgeon: Gladis RAYMOND Mariner, MD;  Location: [facility] ENDOSCOPY;  Service: Endoscopy;  Laterality: N/A;   COLONOSCOPY WITH PROPOFOL  N/A 11/15/2019   Procedure: COLONOSCOPY WITH PROPOFOL ;  Surgeon: Maryruth Ole DASEN, MD;  Location: [facility] ENDOSCOPY;  Service: Gastroenterology;  Laterality: N/A;   COLONOSCOPY WITH PROPOFOL  N/A 07/26/2022   Procedure: COLONOSCOPY WITH PROPOFOL ;  Surgeon: Maryruth Ole DASEN, MD;  Location: [facility] ENDOSCOPY;  Service: Gastroenterology;  Laterality: N/A;   SHOULDER ARTHROSCOPY WITH OPEN ROTATOR CUFF REPAIR Right 11/14/2017   Procedure: SHOULDER ARTHROSCOPY WITH OPEN ROTATOR CUFF REPAIR;  Surgeon: Edie Norleen PARAS, MD;  Location: [facility] ORS;  Service: Orthopedics;  Laterality: Right;   SHOULDER CLOSED REDUCTION Right 03/13/2018   Procedure: CLOSED MANIPULATION SHOULDER;  Surgeon: Edie Norleen PARAS, MD;  Location: [facility] ORS;  Service: Orthopedics;  Laterality: Right;   STERIOD INJECTION Right 03/13/2018   Procedure: STEROID INJECTION RIGHT SHOULDER;  Surgeon: Edie Norleen PARAS, MD;  Location: [facility] ORS;  Service: Orthopedics;  Laterality: Right;   TONSILLECTOMY     WISDOM TOOTH EXTRACTION     Patient Active Problem List   Diagnosis Date Noted   Nontraumatic incomplete tear of right rotator cuff 10/30/2017   Gastro-esophageal reflux disease without esophagitis 07/11/2012   Anxiety disorder 12/17/2008   Iron deficiency anemia 12/17/2008    PCP: Carlin Blamer [facility]  REFERRING PROVIDER: Clois Fret, MD  REFERRING DIAG:  [phone] (ICD-10-CM) - Spinal stenosis in cervical region G95.89 (ICD-10-CM) - Myelomalacia of cervical cord (HCC)  THERAPY DIAG:  Muscle weakness (generalized)  Cervicalgia  Rationale for Evaluation and Treatment: Rehabilitation  ONSET DATE: chronic  SUBJECTIVE:  SUBJECTIVE STATEMENT: Patient reports she is here for stiffness in the neck with known stenosis. She reports soreness in both shoulders and shoulder blades. Patient reports intermittent pain in her neck when she first wakes up. She has burning sensation in the forearms. Hand dominance: Right  PERTINENT HISTORY:  Per Neurosurgery MD note on 12/12/23, She has some soreness in her neck on the left side that is intermittent. She continues with intermittent burning in both arms with intermittent tingling in both hands. She has intermittent weakness in her arms. She has intermittent burning in legs as well with intermittent. No issues with walking or balance. She has cervical stenosis and myelomalacia, however asymptomatic per neurosurgery.  PMH: T2DM, HTN, anxiety, arthritis  PAIN:   Are you having pain? Yes: NPRS scale: 0/10 - 8-9/10  Pain location: neck  Pain description: sore, aching Aggravating factors: sleeping and first getting up  Relieving factors: sports alcohol   PRECAUTIONS: None  RED FLAGS: None     WEIGHT BEARING RESTRICTIONS: No  FALLS:  Has patient fallen in last 6 months? No  LIVING ENVIRONMENT: Lives with: lives with their son Lives in: House/apartment  OCCUPATION: Retired nursing   PLOF: Independent  PATIENT GOALS: I hope it helps and relieves whatever it's supposed to be doing    OBJECTIVE:  Note: Objective measures were completed at Evaluation unless otherwise noted.  DIAGNOSTIC FINDINGS:  EXAM: MRI CERVICAL SPINE WITHOUT CONTRAST  IMPRESSION: 1. Multilevel cervical spondylosis with resultant moderate to severe diffuse spinal stenosis at C3-4 through C6-7, most pronounced at C6-7. 2. Multilevel moderate to severe bilateral foraminal narrowing at C4 through C7 as above. 3. Patchy cord signal abnormality at the level of C4-5, consistent with chronic myelomalacia.  PATIENT SURVEYS:  NDI:  NECK DISABILITY INDEX  Date: 12/14/2023 Score  Pain intensity 0 = I have no pain at the moment  2. Personal care (washing, dressing, etc.) 0 = I can look after myself normally without causing extra pain  3. Lifting 4 =  I can only lift very light weights  4. Reading 1 = I can read as much as I want to with slight pain in my neck  5. Headaches 5 = I have headaches almost all the time  6. Concentration 0 =  I can concentrate fully when I want to with no difficulty  7. Work 1 =  I can only do my usual work, but no more  8. Driving 1 =  I can drive my car as [place] as I want with slight pain in my neck  9. Sleeping 2 = My sleep is mildly disturbed (1-2 hrs sleepless)  10. Recreation 2 = I am able to engage in most, but not all of my usual recreation activities because of   pain in my neck  Total 16/50 = 32%   Minimum Detectable Change  (90% confidence): 5 points or 10% points  COGNITION: Overall cognitive status: Within functional limits for tasks assessed  SENSATION: WFL - although burning sensation and numb to touch but light touch testing normal  POSTURE: No Significant postural limitations  PALPATION: Significant tightness to B UT, levator, cervical paraspinals, and suboccipitals Trigger points noted in B UT  CERVICAL ROM:   Active ROM A/PROM (deg) eval  Flexion WFL  Extension WFL  Right lateral flexion 45  Left lateral flexion 38  Right rotation 29  Left rotation 19   (Blank rows = not tested)  UPPER EXTREMITY ROM: WFL   UPPER EXTREMITY MMT:  MMT Right eval Left eval  Shoulder flexion 4* 4*  Shoulder extension    Shoulder abduction 4* 4*  Shoulder adduction    Shoulder extension    Shoulder internal rotation    Shoulder external rotation    Middle trapezius    Lower trapezius    Elbow flexion 4 4  Elbow extension 4 4  Wrist flexion    Wrist extension    Wrist ulnar deviation    Wrist radial deviation    Wrist pronation    Wrist supination    Grip strength     (Blank rows = not tested)   Grip strength equal  CERVICAL SPECIAL TESTS:  NT - Spurling's contraindicated due to known cervical myelopathy    TREATMENT DATE: 01/08/24                                                                                                                                 Subjective: Patient reports no pain at start of the session. She reports that her symptoms have improved since start of the POC. No questions or concerns.   Therapeutic Exercise:   OMEGA Cable Machine    Seated row 3 x 10 - 10#     Seated lat pull down  2 x 10 - 10#, 1 x 10 - 15#     Standing face pulls  2 x 10 x 5#     Seated B shoulder horizontal abduction with BTB 3 x 10    Seated Upper Trapezius Stretch    R/L: 30s/bout x 3 in order to improve tissue extensibility   Standing Door Stretch    30s/bout x 3    Therapeutic Activity   UBE (seat 7) x 5 minutes (2.5 min fwd/2.5 min bwd) at level 10-6 to improve UE strength and mobility. PT manually adjusted resistance throughout time.    TRX Shoulder Rows for bicep and rhomboid strengthening    3 x 10    Standing Shoulder Press for upward reaching     3 x 10 - 6#      Wall Push Up   3 x 10    PATIENT EDUCATION:  Education details: HEP, POC, goals Person educated: Patient Education method: Explanation, Demonstration, and Handouts Education comprehension: verbalized understanding and returned demonstration  HOME EXERCISE PROGRAM: Access Code: [email or web address] URL: [email or web address] Date: 12/21/2023 Prepared by: Maryanne Finder  Exercises - Seated Upper Trapezius Stretch  - 2-3 x daily - 5-7 x weekly - 3-5 reps - 30-60 seconds  hold - Seated Levator Scapulae Stretch  - 2-3 x daily - 5-7 x weekly - 3-5 reps - 30-60 seconds  hold - Seated Assisted Cervical Rotation with Towel  - 1-2 x daily - 5-7 x weekly - 10 reps - 5-10 second hold - Standing Shoulder Row with Anchored Resistance  - 1-2 x daily - 5-7 x weekly - 3 sets - 10 reps - Shoulder extension with resistance -  Neutral  - 1-2 x daily - 5-7 x weekly - 3 sets - 10 reps - Shoulder External Rotation and Scapular Retraction with Resistance  - 1-2 x daily - 5-7 x weekly - 3 sets - 10 reps - Standing Shoulder Horizontal Abduction with Resistance  - 1-2 x daily - 5-7 x weekly - 3 sets - 10 reps  ASSESSMENT:  CLINICAL IMPRESSION:     Continued PT POC upper neck strengthening. Main focus on scapular strengthening and postural retraining. She tolerated an increase with resistance and intensity without exacerbation of pain throughout session. Patient's HA frequency has significantly improved with HEP. She still has intermittent numbness in bilateral shoulders disrupting her sleep cycle. Patient will benefit from skilled PT services to address listed impairments to improve quality of life  and reduce frequency of headaches.   OBJECTIVE IMPAIRMENTS: decreased activity tolerance, decreased ROM, decreased strength, impaired flexibility, impaired sensation, postural dysfunction, and pain.   ACTIVITY LIMITATIONS: lifting, bending, and sleeping  PARTICIPATION LIMITATIONS: cleaning, laundry, and community activity  PERSONAL FACTORS: Age, Past/current experiences, and known cervical stenosis with myelomalacia are also affecting patient's functional outcome.   REHAB POTENTIAL: Good  CLINICAL DECISION MAKING: Stable/uncomplicated  EVALUATION COMPLEXITY: Low   GOALS: Goals reviewed with patient? Yes  SHORT TERM GOALS: Target date: 01/11/2024  Patient will be independent in HEP to improve strength/mobility for better functional independence with ADLs. Baseline: 12/14/2023: HEP initiated  Goal status: INITIAL   [place] TERM GOALS: Target date: 02/08/2024  Patient will reduce Neck Disability Index score to <10% to demonstrate minimal disability with ADL's including improved sleeping tolerance, sitting tolerance, etc for better mobility at home and work. Baseline: 12/14/2023: 16/50 = 32% Goal status: INITIAL  2.  Patient will improve cervical rotation and sidebending by 10 degrees to demonstrate improved mobility and ability to complete driving safely. Baseline: 12/14/2023: see above  Goal status: INITIAL  3.  Patient will improve BUE strength by 1/3 MMT grade without pain to demonstrate improved ability to complete daily activities and household chores. Baseline: 12/14/2023: see above  Goal status: INITIAL  4.  Patient will deny headaches within 2 week time period to demonstrate improvement in muscular tightness, specifically upper traps and levator scapulae.  Baseline: 10/2/205: frequent headaches, last on 12/13/2023 Goal status: INITIAL   PLAN:  PT FREQUENCY: 1-2x/week  PT DURATION: 8 weeks  PLANNED INTERVENTIONS: 97164- PT Re-evaluation, 97750- Physical Performance  Testing, 97110-Therapeutic exercises, 97530- Therapeutic activity, 97112- Neuromuscular re-education, 97535- Self Care, 02859- Manual therapy, G0283- Electrical stimulation (unattended), [phone]- Electrical stimulation (manual), 20560 (1-2 muscles), 20561 (3+ muscles)- Dry Needling, Patient/Family education, Joint mobilization, Joint manipulation, Spinal manipulation, Spinal mobilization, Cryotherapy, and Moist heat  PLAN FOR NEXT SESSION: HEP review, manual stretching/STM, scapular strengthening    Lonni Pall PT, DPT Physical Therapist- [facility]  [facility] 01/08/2024, 9:52 AM

## 2024-01-11 ENCOUNTER — Ambulatory Visit

## 2024-01-12 IMAGING — MG MM DIGITAL SCREENING BILAT W/ TOMO AND CAD
6 of 10 series · 6 of 30 positions shown · non-contrast
Comparison: Previous exam(s).

CLINICAL DATA: Screening.

EXAM:
DIGITAL SCREENING BILATERAL MAMMOGRAM WITH TOMOSYNTHESIS AND CAD
TECHNIQUE: Bilateral screening digital craniocaudal and mediolateral oblique
mammograms were obtained. Bilateral screening digital breast
tomosynthesis was performed. The images were evaluated with
computer-aided detection.

[L MLO synth-2D (1 of 2)]
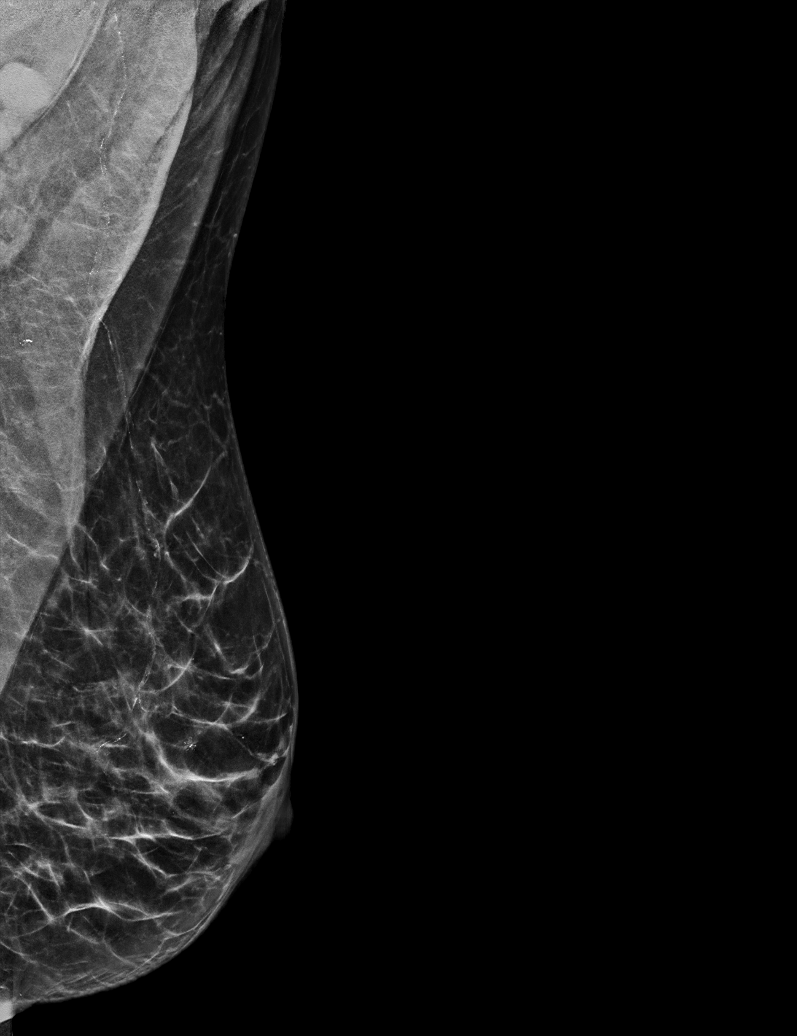

[L MLO synth-2D (2 of 2)]
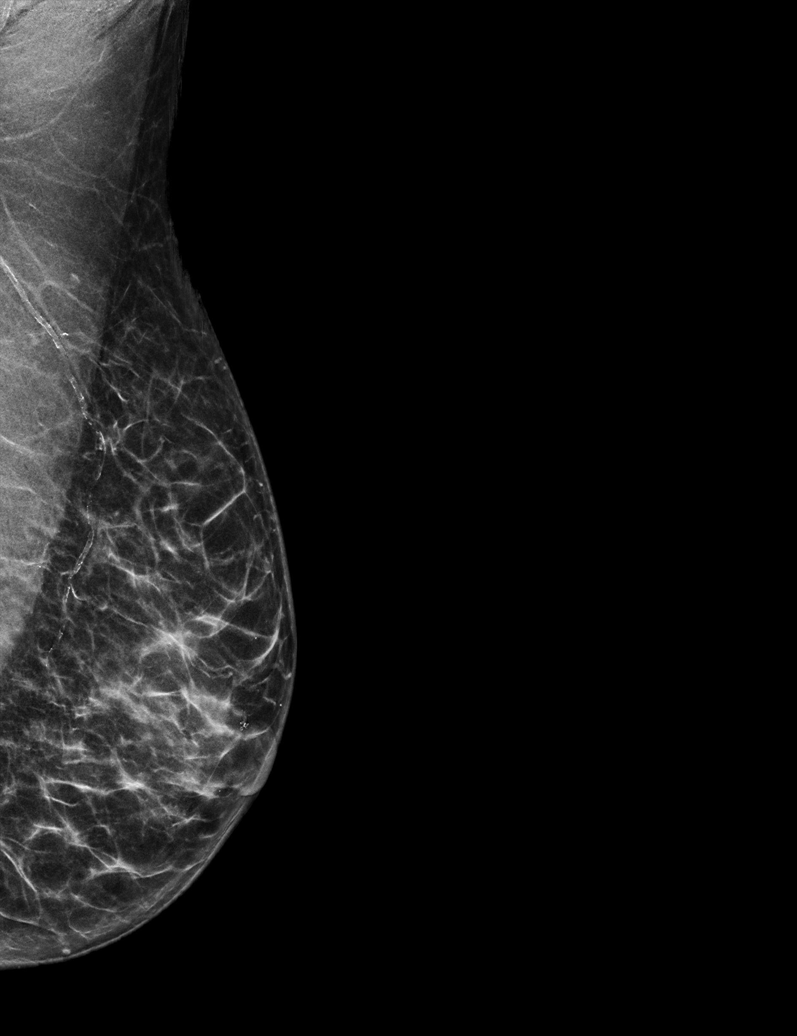

[R MLO synth-2D]
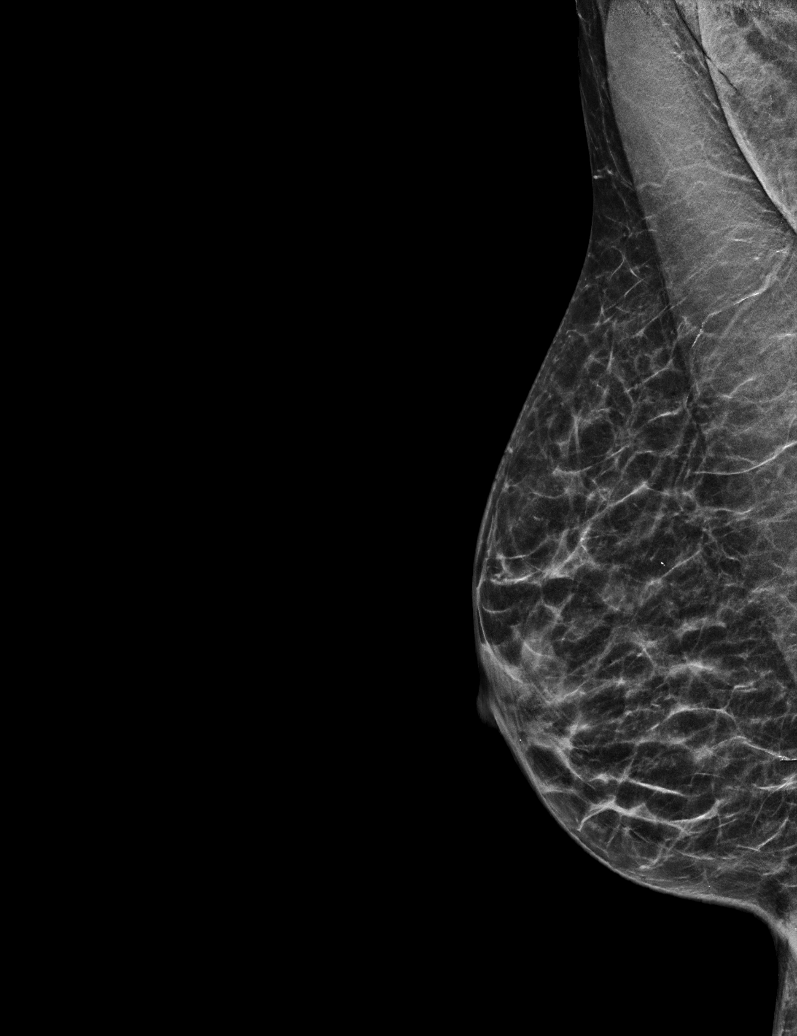

[L CC synth-2D]
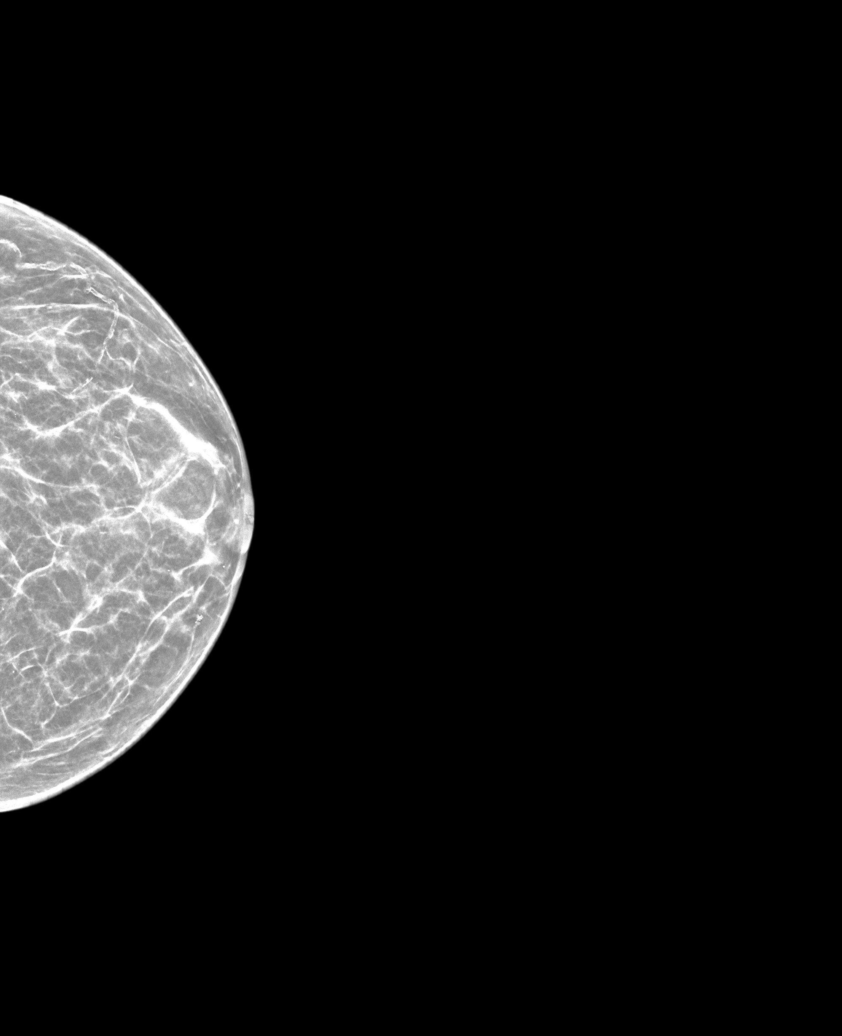

[R CC synth-2D]
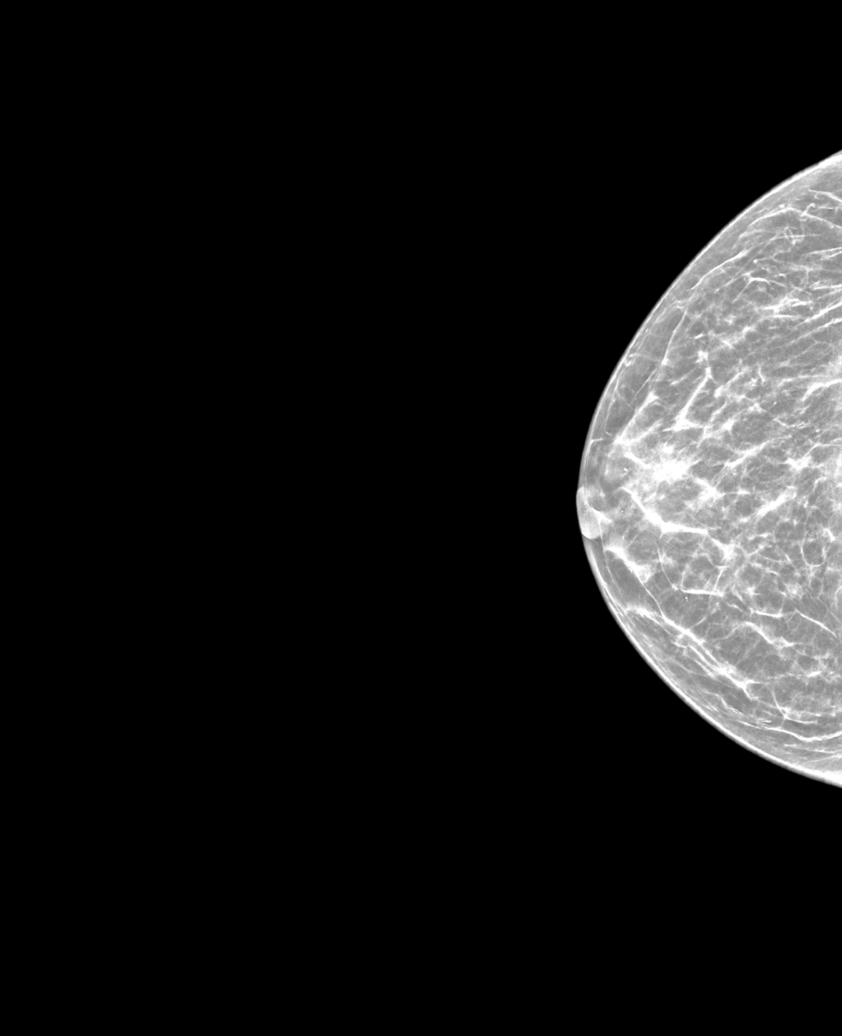

[L MLO tomo · tomo slice 25/48.0]
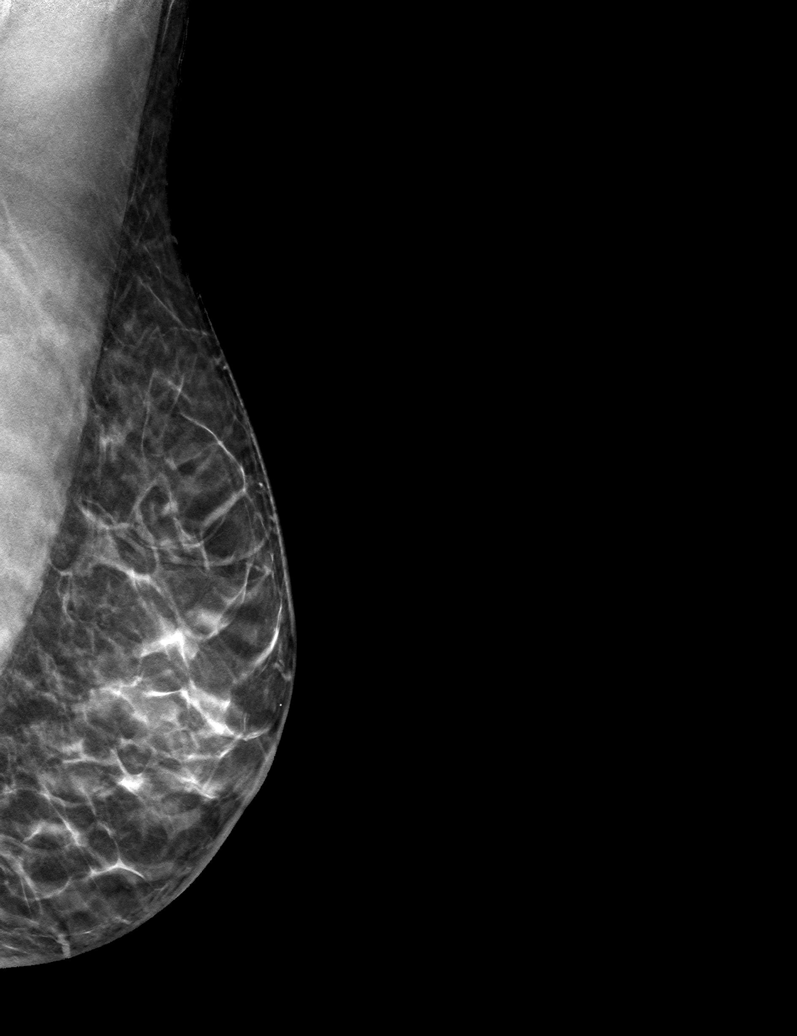

[6 of 30 positions shown; findings below may reference images not displayed]

ACR Breast Density Category b: There are scattered areas of
fibroglandular density.
FINDINGS: There are no findings suspicious for malignancy.
IMPRESSION: No mammographic evidence of malignancy. A result letter of this
screening mammogram will be mailed directly to the patient.

RECOMMENDATION:
Screening mammogram in one year. (Code:51-O-LD2)

BI-RADS CATEGORY  1: Negative.

## 2024-01-15 ENCOUNTER — Ambulatory Visit: Attending: Neurosurgery

## 2024-01-15 DIAGNOSIS — M6281 Muscle weakness (generalized): Secondary | ICD-10-CM | POA: Diagnosis present

## 2024-01-15 DIAGNOSIS — M542 Cervicalgia: Secondary | ICD-10-CM | POA: Insufficient documentation

## 2024-01-15 NOTE — Therapy (Signed)
 OUTPATIENT PHYSICAL THERAPY CERVICAL TREATMENT   Patient Name: Yolanda Barker MRN: [id number] DOB:1956/02/15, 68 y.o., female Today's Date: 01/15/2024  END OF SESSION:  PT End of Session - 01/15/24 1346     Visit Number 7    Number of Visits 17    Date for Recertification  02/08/24    PT Start Time 1345    PT Stop Time 1430    PT Time Calculation (min) 45 min    Activity Tolerance Patient tolerated treatment well    Behavior During Therapy WFL for tasks assessed/performed               Past Medical History:  Diagnosis Date   Adhesive capsulitis of right shoulder    Anemia    Anxiety    Arthritis    Diabetes mellitus without complication (HCC)    Diverticulitis    Dyspnea    GERD (gastroesophageal reflux disease)    Hypertension    IDA (iron deficiency anemia)    Injury of tendon of [place] head of right biceps    Nontraumatic incomplete tear of right rotator cuff    Rotator cuff tendinitis, right    Vertigo    Past Surgical History:  Procedure Laterality Date   ABDOMINAL HYSTERECTOMY     COLONOSCOPY WITH PROPOFOL  N/A 03/15/2016   Procedure: COLONOSCOPY WITH PROPOFOL ;  Surgeon: Gladis RAYMOND Mariner, MD;  Location: [facility] ENDOSCOPY;  Service: Endoscopy;  Laterality: N/A;   COLONOSCOPY WITH PROPOFOL  N/A 11/15/2019   Procedure: COLONOSCOPY WITH PROPOFOL ;  Surgeon: Maryruth Ole DASEN, MD;  Location: [facility] ENDOSCOPY;  Service: Gastroenterology;  Laterality: N/A;   COLONOSCOPY WITH PROPOFOL  N/A 07/26/2022   Procedure: COLONOSCOPY WITH PROPOFOL ;  Surgeon: Maryruth Ole DASEN, MD;  Location: [facility] ENDOSCOPY;  Service: Gastroenterology;  Laterality: N/A;   SHOULDER ARTHROSCOPY WITH OPEN ROTATOR CUFF REPAIR Right 11/14/2017   Procedure: SHOULDER ARTHROSCOPY WITH OPEN ROTATOR CUFF REPAIR;  Surgeon: Edie Norleen PARAS, MD;  Location: [facility] ORS;  Service: Orthopedics;  Laterality: Right;   SHOULDER CLOSED REDUCTION Right 03/13/2018   Procedure: CLOSED MANIPULATION SHOULDER;  Surgeon: Edie Norleen PARAS, MD;  Location: [facility] ORS;  Service: Orthopedics;  Laterality: Right;   STERIOD INJECTION Right 03/13/2018   Procedure: STEROID INJECTION RIGHT SHOULDER;  Surgeon: Edie Norleen PARAS, MD;  Location: [facility] ORS;  Service: Orthopedics;  Laterality: Right;   TONSILLECTOMY     WISDOM TOOTH EXTRACTION     Patient Active Problem List   Diagnosis Date Noted   Nontraumatic incomplete tear of right rotator cuff 10/30/2017   Gastro-esophageal reflux disease without esophagitis 07/11/2012   Anxiety disorder 12/17/2008   Iron deficiency anemia 12/17/2008    PCP: [facility]  REFERRING PROVIDER: Clois Fret, MD  REFERRING DIAG:  [phone] (ICD-10-CM) - Spinal stenosis in cervical region G95.89 (ICD-10-CM) - Myelomalacia of cervical cord (HCC)  THERAPY DIAG:  Muscle weakness (generalized)  Cervicalgia  Rationale for Evaluation and Treatment: Rehabilitation  ONSET DATE: chronic  SUBJECTIVE:  SUBJECTIVE STATEMENT: Patient reports she is here for stiffness in the neck with known stenosis. She reports soreness in both shoulders and shoulder blades. Patient reports intermittent pain in her neck when she first wakes up. She has burning sensation in the forearms. Hand dominance: Right  PERTINENT HISTORY:  Per Neurosurgery MD note on 12/12/23, She has some soreness in her neck on the left side that is intermittent. She continues with intermittent burning in both arms with intermittent tingling in both hands. She has intermittent weakness in her arms. She has intermittent burning in legs as well with intermittent. No issues with walking or balance. She has cervical stenosis and myelomalacia, however asymptomatic per neurosurgery.  PMH: T2DM, HTN, anxiety, arthritis  PAIN:   Are you having pain? Yes: NPRS scale: 0/10 - 8-9/10  Pain location: neck  Pain description: sore, aching Aggravating factors: sleeping and first getting up  Relieving factors: sports alcohol   PRECAUTIONS: None  RED FLAGS: None     WEIGHT BEARING RESTRICTIONS: No  FALLS:  Has patient fallen in last 6 months? No  LIVING ENVIRONMENT: Lives with: lives with their son Lives in: House/apartment  OCCUPATION: Retired nursing   PLOF: Independent  PATIENT GOALS: I hope it helps and relieves whatever it's supposed to be doing    OBJECTIVE:  Note: Objective measures were completed at Evaluation unless otherwise noted.  DIAGNOSTIC FINDINGS:  EXAM: MRI CERVICAL SPINE WITHOUT CONTRAST  IMPRESSION: 1. Multilevel cervical spondylosis with resultant moderate to severe diffuse spinal stenosis at C3-4 through C6-7, most pronounced at C6-7. 2. Multilevel moderate to severe bilateral foraminal narrowing at C4 through C7 as above. 3. Patchy cord signal abnormality at the level of C4-5, consistent with chronic myelomalacia.  PATIENT SURVEYS:  NDI:  NECK DISABILITY INDEX  Date: 12/14/2023 Score  Pain intensity 0 = I have no pain at the moment  2. Personal care (washing, dressing, etc.) 0 = I can look after myself normally without causing extra pain  3. Lifting 4 =  I can only lift very light weights  4. Reading 1 = I can read as much as I want to with slight pain in my neck  5. Headaches 5 = I have headaches almost all the time  6. Concentration 0 =  I can concentrate fully when I want to with no difficulty  7. Work 1 =  I can only do my usual work, but no more  8. Driving 1 =  I can drive my car as [place] as I want with slight pain in my neck  9. Sleeping 2 = My sleep is mildly disturbed (1-2 hrs sleepless)  10. Recreation 2 = I am able to engage in most, but not all of my usual recreation activities because of   pain in my neck  Total 16/50 = 32%   Minimum Detectable Change  (90% confidence): 5 points or 10% points  COGNITION: Overall cognitive status: Within functional limits for tasks assessed  SENSATION: WFL - although burning sensation and numb to touch but light touch testing normal  POSTURE: No Significant postural limitations  PALPATION: Significant tightness to B UT, levator, cervical paraspinals, and suboccipitals Trigger points noted in B UT  CERVICAL ROM:   Active ROM A/PROM (deg) eval  Flexion WFL  Extension WFL  Right lateral flexion 45  Left lateral flexion 38  Right rotation 29  Left rotation 19   (Blank rows = not tested)  UPPER EXTREMITY ROM: WFL   UPPER EXTREMITY MMT:  MMT Right eval Left eval  Shoulder flexion 4* 4*  Shoulder extension    Shoulder abduction 4* 4*  Shoulder adduction    Shoulder extension    Shoulder internal rotation    Shoulder external rotation    Middle trapezius    Lower trapezius    Elbow flexion 4 4  Elbow extension 4 4  Wrist flexion    Wrist extension    Wrist ulnar deviation    Wrist radial deviation    Wrist pronation    Wrist supination    Grip strength     (Blank rows = not tested)   Grip strength equal  CERVICAL SPECIAL TESTS:  NT - Spurling's contraindicated due to known cervical myelopathy    TREATMENT DATE: 01/15/24                                                                                                                                 Subjective: Patient reports improvements with symptoms however she still has intermittent aching in her L arm. No questions or concerns   Therapeutic Exercise:   OMEGA Cable Machine    Seated row    1 x 10 - 10#      2 x 10 - 15#     Seated lat pull down        3 x 10 - 15#   Standing Shoulder Face Pull   3 x 10 - Blue TB    Standing Door Stretch   30s/bout x 4 to improve tissue extensibility    Therapeutic Activity   UBE (seat 7) x 5 minutes (2.5 min fwd/2.5 min bwd) at level 10-6 to improve UE strength and mobility.  PT manually adjusted resistance throughout time.    TRX Shoulder Rows for bicep and rhomboid strengthening    3 x 10    Standing Scaption for upward reaching and scapular strength    3 x 10 3# DB    - multimodal cues for proper form   Standing Shoulder Press for upward reaching     3 x 10 - 6#      Wall Push Up - focus on scapular protraction and retraction    3 x 10   Bent Row on Mat Table   R/L 2 x 10 - 5#     PATIENT EDUCATION:  Education details: HEP, POC, goals Person educated: Patient Education method: Explanation, Demonstration, and Handouts Education comprehension: verbalized understanding and returned demonstration  HOME EXERCISE PROGRAM: Access Code: [email or web address] URL: [email or web address] Date: 12/21/2023 Prepared by: Maryanne Finder  Exercises - Seated Upper Trapezius Stretch  - 2-3 x daily - 5-7 x weekly - 3-5 reps - 30-60 seconds  hold - Seated Levator Scapulae Stretch  - 2-3 x daily - 5-7 x weekly - 3-5 reps - 30-60 seconds  hold - Seated Assisted Cervical Rotation with Towel  - 1-2 x daily - 5-7 x weekly - 10 reps -  5-10 second hold - Standing Shoulder Row with Anchored Resistance  - 1-2 x daily - 5-7 x weekly - 3 sets - 10 reps - Shoulder extension with resistance - Neutral  - 1-2 x daily - 5-7 x weekly - 3 sets - 10 reps - Shoulder External Rotation and Scapular Retraction with Resistance  - 1-2 x daily - 5-7 x weekly - 3 sets - 10 reps - Standing Shoulder Horizontal Abduction with Resistance  - 1-2 x daily - 5-7 x weekly - 3 sets - 10 reps  ASSESSMENT:  CLINICAL IMPRESSION:     Continued PT POC upper neck strengthening. Tolerated all exercises well without exacerbation to upper neck pain. Session focused on scapular strengthening to improve cervical posture. Patient with good ability to perform scapular protraction but required multimodal cues for proper form. She still has intermittent numbness in bilateral shoulders disrupting her sleep cycle.  PT plans to continue focusing on shoulder stability, scapular strengthening and postural retraining in order reduce cervicogenic HA. Patient will benefit from skilled PT services to address listed impairments to improve quality of life and reduce frequency of headaches.   OBJECTIVE IMPAIRMENTS: decreased activity tolerance, decreased ROM, decreased strength, impaired flexibility, impaired sensation, postural dysfunction, and pain.   ACTIVITY LIMITATIONS: lifting, bending, and sleeping  PARTICIPATION LIMITATIONS: cleaning, laundry, and community activity  PERSONAL FACTORS: Age, Past/current experiences, and known cervical stenosis with myelomalacia are also affecting patient's functional outcome.   REHAB POTENTIAL: Good  CLINICAL DECISION MAKING: Stable/uncomplicated  EVALUATION COMPLEXITY: Low   GOALS: Goals reviewed with patient? Yes  SHORT TERM GOALS: Target date: 01/11/2024  Patient will be independent in HEP to improve strength/mobility for better functional independence with ADLs. Baseline: 12/14/2023: HEP initiated  Goal status: INITIAL   [place] TERM GOALS: Target date: 02/08/2024  Patient will reduce Neck Disability Index score to <10% to demonstrate minimal disability with ADL's including improved sleeping tolerance, sitting tolerance, etc for better mobility at home and work. Baseline: 12/14/2023: 16/50 = 32% Goal status: INITIAL  2.  Patient will improve cervical rotation and sidebending by 10 degrees to demonstrate improved mobility and ability to complete driving safely. Baseline: 12/14/2023: see above  Goal status: INITIAL  3.  Patient will improve BUE strength by 1/3 MMT grade without pain to demonstrate improved ability to complete daily activities and household chores. Baseline: 12/14/2023: see above  Goal status: INITIAL  4.  Patient will deny headaches within 2 week time period to demonstrate improvement in muscular tightness, specifically upper traps and levator  scapulae.  Baseline: 10/2/205: frequent headaches, last on 12/13/2023 Goal status: INITIAL   PLAN:  PT FREQUENCY: 1-2x/week  PT DURATION: 8 weeks  PLANNED INTERVENTIONS: [id number]- PT Re-evaluation, 97750- Physical Performance Testing, 97110-Therapeutic exercises, 97530- Therapeutic activity, 97112- Neuromuscular re-education, 97535- Self Care, [id number]- Manual therapy, [id number]- Electrical stimulation (unattended), [phone]- Electrical stimulation (manual), 20560 (1-2 muscles), 20561 (3+ muscles)- Dry Needling, Patient/Family education, Joint mobilization, Joint manipulation, Spinal manipulation, Spinal mobilization, Cryotherapy, and Moist heat  PLAN FOR NEXT SESSION: HEP review, manual stretching/STM, scapular strengthening    Lonni Pall PT, DPT Physical Therapist- [facility]  [facility] 01/15/2024, 1:46 PM

## 2024-01-17 ENCOUNTER — Ambulatory Visit

## 2024-01-22 ENCOUNTER — Ambulatory Visit

## 2024-01-22 DIAGNOSIS — M542 Cervicalgia: Secondary | ICD-10-CM

## 2024-01-22 DIAGNOSIS — M6281 Muscle weakness (generalized): Secondary | ICD-10-CM | POA: Diagnosis not present

## 2024-01-22 NOTE — Therapy (Signed)
 OUTPATIENT PHYSICAL THERAPY CERVICAL TREATMENT   Patient Name: Yolanda Barker MRN: [id number] DOB:October 20, 1955, 68 y.o., female Today's Date: 01/22/2024  END OF SESSION:  PT End of Session - 01/22/24 1303     Visit Number 8    Number of Visits 17    Date for Recertification  02/08/24    Authorization - Visit Number 8    Authorization - Number of Visits 17    Progress Note Due on Visit 10    PT Start Time 1303    PT Stop Time 1342    PT Time Calculation (min) 39 min    Activity Tolerance Patient tolerated treatment well    Behavior During Therapy WFL for tasks assessed/performed               Past Medical History:  Diagnosis Date   Adhesive capsulitis of right shoulder    Anemia    Anxiety    Arthritis    Diabetes mellitus without complication (HCC)    Diverticulitis    Dyspnea    GERD (gastroesophageal reflux disease)    Hypertension    IDA (iron deficiency anemia)    Injury of tendon of [place] head of right biceps    Nontraumatic incomplete tear of right rotator cuff    Rotator cuff tendinitis, right    Vertigo    Past Surgical History:  Procedure Laterality Date   ABDOMINAL HYSTERECTOMY     COLONOSCOPY WITH PROPOFOL  N/A 03/15/2016   Procedure: COLONOSCOPY WITH PROPOFOL ;  Surgeon: Gladis RAYMOND Mariner, MD;  Location: [facility] ENDOSCOPY;  Service: Endoscopy;  Laterality: N/A;   COLONOSCOPY WITH PROPOFOL  N/A 11/15/2019   Procedure: COLONOSCOPY WITH PROPOFOL ;  Surgeon: Maryruth Ole DASEN, MD;  Location: [facility] ENDOSCOPY;  Service: Gastroenterology;  Laterality: N/A;   COLONOSCOPY WITH PROPOFOL  N/A 07/26/2022   Procedure: COLONOSCOPY WITH PROPOFOL ;  Surgeon: Maryruth Ole DASEN, MD;  Location: [facility] ENDOSCOPY;  Service: Gastroenterology;  Laterality: N/A;   SHOULDER ARTHROSCOPY WITH OPEN ROTATOR CUFF REPAIR Right 11/14/2017   Procedure: SHOULDER ARTHROSCOPY WITH OPEN ROTATOR CUFF REPAIR;  Surgeon: Edie Norleen PARAS, MD;  Location: [facility] ORS;  Service: Orthopedics;  Laterality:  Right;   SHOULDER CLOSED REDUCTION Right 03/13/2018   Procedure: CLOSED MANIPULATION SHOULDER;  Surgeon: Edie Norleen PARAS, MD;  Location: [facility] ORS;  Service: Orthopedics;  Laterality: Right;   STERIOD INJECTION Right 03/13/2018   Procedure: STEROID INJECTION RIGHT SHOULDER;  Surgeon: Edie Norleen PARAS, MD;  Location: [facility] ORS;  Service: Orthopedics;  Laterality: Right;   TONSILLECTOMY     WISDOM TOOTH EXTRACTION     Patient Active Problem List   Diagnosis Date Noted   Nontraumatic incomplete tear of right rotator cuff 10/30/2017   Gastro-esophageal reflux disease without esophagitis 07/11/2012   Anxiety disorder 12/17/2008   Iron deficiency anemia 12/17/2008    PCP: [facility]  REFERRING PROVIDER: Clois Fret, MD  REFERRING DIAG:  [phone] (ICD-10-CM) - Spinal stenosis in cervical region G95.89 (ICD-10-CM) - Myelomalacia of cervical cord (HCC)  THERAPY DIAG:  Muscle weakness (generalized)  Cervicalgia  Rationale for Evaluation and Treatment: Rehabilitation  ONSET DATE: chronic  SUBJECTIVE:  SUBJECTIVE STATEMENT: Patient reports she is here for stiffness in the neck with known stenosis. She reports soreness in both shoulders and shoulder blades. Patient reports intermittent pain in her neck when she first wakes up. She has burning sensation in the forearms. Hand dominance: Right  PERTINENT HISTORY:  Per Neurosurgery MD note on 12/12/23, She has some soreness in her neck on the left side that is intermittent. She continues with intermittent burning in both arms with intermittent tingling in both hands. She has intermittent weakness in her arms. She has intermittent burning in legs as well with intermittent. No issues with walking or balance. She has cervical  stenosis and myelomalacia, however asymptomatic per neurosurgery.  PMH: T2DM, HTN, anxiety, arthritis  PAIN:  Are you having pain? Yes: NPRS scale: 0/10 - 8-9/10  Pain location: neck  Pain description: sore, aching Aggravating factors: sleeping and first getting up  Relieving factors: sports alcohol   PRECAUTIONS: None  RED FLAGS: None     WEIGHT BEARING RESTRICTIONS: No  FALLS:  Has patient fallen in last 6 months? No  LIVING ENVIRONMENT: Lives with: lives with their son Lives in: House/apartment  OCCUPATION: Retired nursing   PLOF: Independent  PATIENT GOALS: I hope it helps and relieves whatever it's supposed to be doing    OBJECTIVE:  Note: Objective measures were completed at Evaluation unless otherwise noted.  DIAGNOSTIC FINDINGS:  EXAM: MRI CERVICAL SPINE WITHOUT CONTRAST  IMPRESSION: 1. Multilevel cervical spondylosis with resultant moderate to severe diffuse spinal stenosis at C3-4 through C6-7, most pronounced at C6-7. 2. Multilevel moderate to severe bilateral foraminal narrowing at C4 through C7 as above. 3. Patchy cord signal abnormality at the level of C4-5, consistent with chronic myelomalacia.  PATIENT SURVEYS:  NDI:  NECK DISABILITY INDEX  Date: 12/14/2023 Score  Pain intensity 0 = I have no pain at the moment  2. Personal care (washing, dressing, etc.) 0 = I can look after myself normally without causing extra pain  3. Lifting 4 =  I can only lift very light weights  4. Reading 1 = I can read as much as I want to with slight pain in my neck  5. Headaches 5 = I have headaches almost all the time  6. Concentration 0 =  I can concentrate fully when I want to with no difficulty  7. Work 1 =  I can only do my usual work, but no more  8. Driving 1 =  I can drive my car as [place] as I want with slight pain in my neck  9. Sleeping 2 = My sleep is mildly disturbed (1-2 hrs sleepless)  10. Recreation 2 = I am able to engage in most, but not all of  my usual recreation activities because of   pain in my neck  Total 16/50 = 32%   Minimum Detectable Change (90% confidence): 5 points or 10% points  COGNITION: Overall cognitive status: Within functional limits for tasks assessed  SENSATION: WFL - although burning sensation and numb to touch but light touch testing normal  POSTURE: No Significant postural limitations  PALPATION: Significant tightness to B UT, levator, cervical paraspinals, and suboccipitals Trigger points noted in B UT  CERVICAL ROM:   Active ROM A/PROM (deg) eval  Flexion WFL  Extension WFL  Right lateral flexion 45  Left lateral flexion 38  Right rotation 29  Left rotation 19   (Blank rows = not tested)  UPPER EXTREMITY ROM: WFL   UPPER EXTREMITY MMT:  MMT Right eval Left eval  Shoulder flexion 4* 4*  Shoulder extension    Shoulder abduction 4* 4*  Shoulder adduction    Shoulder extension    Shoulder internal rotation    Shoulder external rotation    Middle trapezius    Lower trapezius    Elbow flexion 4 4  Elbow extension 4 4  Wrist flexion    Wrist extension    Wrist ulnar deviation    Wrist radial deviation    Wrist pronation    Wrist supination    Grip strength     (Blank rows = not tested)   Grip strength equal  CERVICAL SPECIAL TESTS:  NT - Spurling's contraindicated due to known cervical myelopathy    TREATMENT DATE: 01/22/24                                                                                                                                 Subjective: Patient reports minor burning sensation and weakness this past weekend. Pain was mitigated with HEP. No questions or concerns   Therapeutic Exercise:   OMEGA Cable Machine    Seated row    1 x 10 - 10#      2 x 10 - 15#     Seated lat pull down        3 x 10 - 15#   Standing Shoulder Face Pull   3 x 10 - Blue TB    Bilateral Shoulder ER with Scap Retraction     3 x 10 - Green TB    Seated Upper  Trapezius Stretch    L: 30s/bout x 2 in order to improve ROM and tissue extensibility  Therapeutic Activity   UBE (seat 7) x 5 minutes (2.5 min fwd/2.5 min bwd) at level 10-6 to improve UE strength and mobility. PT manually adjusted resistance throughout time.    TRX Shoulder Rows for bicep and rhomboid strengthening    3 x 10    Incline Bench Scaption for upward reaching and scapular strength    3 x 10 3# DB    Standing Shoulder Press for upward reaching     3 x 10 - 6# DB in both      Modified Push Up - focus on scapular protraction and retraction    3 x 10 - on Reverse Bosu placed on mat table   PATIENT EDUCATION:  Education details: HEP, POC, goals Person educated: Patient Education method: Explanation, Demonstration, and Handouts Education comprehension: verbalized understanding and returned demonstration  HOME EXERCISE PROGRAM: Access Code: [email or web address] URL: [email or web address] Date: 01/22/2024 Prepared by: Lonni Pall  Exercises - Seated Upper Trapezius Stretch  - 2-3 x daily - 5-7 x weekly - 3-5 reps - 30-60 seconds  hold - Seated Levator Scapulae Stretch  - 2-3 x daily - 5-7 x weekly - 3-5 reps - 30-60 seconds  hold - Seated Assisted Cervical Rotation with Towel  -  1-2 x daily - 5-7 x weekly - 10 reps - 5-10 second hold - Standing Shoulder Row with Anchored Resistance  - 1-2 x daily - 5-7 x weekly - 3 sets - 10 reps - Shoulder extension with resistance - Neutral  - 1-2 x daily - 5-7 x weekly - 3 sets - 10 reps - Shoulder External Rotation and Scapular Retraction with Resistance  - 1-2 x daily - 5-7 x weekly - 3 sets - 10 reps - Standing Shoulder Horizontal Abduction with Resistance  - 1-2 x daily - 5-7 x weekly - 3 sets - 10 reps - Wall Push Up with Plus  - 1 x daily - 5-7 x weekly - 2-3 sets - 10 reps - Standing Shoulder Row with Anchored Resistance  - 1 x daily - 5-7 x weekly - 2-3 sets - 10-12 reps  Access Code: [email or web address] URL:  [email or web address] Date: 12/21/2023 Prepared by: Maryanne Finder  Exercises - Seated Upper Trapezius Stretch  - 2-3 x daily - 5-7 x weekly - 3-5 reps - 30-60 seconds  hold - Seated Levator Scapulae Stretch  - 2-3 x daily - 5-7 x weekly - 3-5 reps - 30-60 seconds  hold - Seated Assisted Cervical Rotation with Towel  - 1-2 x daily - 5-7 x weekly - 10 reps - 5-10 second hold - Standing Shoulder Row with Anchored Resistance  - 1-2 x daily - 5-7 x weekly - 3 sets - 10 reps - Shoulder extension with resistance - Neutral  - 1-2 x daily - 5-7 x weekly - 3 sets - 10 reps - Shoulder External Rotation and Scapular Retraction with Resistance  - 1-2 x daily - 5-7 x weekly - 3 sets - 10 reps - Standing Shoulder Horizontal Abduction with Resistance  - 1-2 x daily - 5-7 x weekly - 3 sets - 10 reps  ASSESSMENT:  CLINICAL IMPRESSION:     Continued PT POC upper neck strengthening. Good tolerance to progression of exercises without exacerbation of pain in the upper neck. Patient able to perform scapular protraction/retraction with minimal cues during push up exercises. Intermittent shoulder shrug during exercises but she is able to correct with verbal cue. PT plans to continue focusing on shoulder stability, scapular strengthening and postural retraining in order reduce cervicogenic HA. Patient will benefit from skilled PT services to address listed impairments to improve quality of life and reduce frequency of headaches.   OBJECTIVE IMPAIRMENTS: decreased activity tolerance, decreased ROM, decreased strength, impaired flexibility, impaired sensation, postural dysfunction, and pain.   ACTIVITY LIMITATIONS: lifting, bending, and sleeping  PARTICIPATION LIMITATIONS: cleaning, laundry, and community activity  PERSONAL FACTORS: Age, Past/current experiences, and known cervical stenosis with myelomalacia are also affecting patient's functional outcome.   REHAB POTENTIAL: Good  CLINICAL DECISION  MAKING: Stable/uncomplicated  EVALUATION COMPLEXITY: Low   GOALS: Goals reviewed with patient? Yes  SHORT TERM GOALS: Target date: 01/11/2024  Patient will be independent in HEP to improve strength/mobility for better functional independence with ADLs. Baseline: 12/14/2023: HEP initiated  Goal status: INITIAL   [place] TERM GOALS: Target date: 02/08/2024  Patient will reduce Neck Disability Index score to <10% to demonstrate minimal disability with ADL's including improved sleeping tolerance, sitting tolerance, etc for better mobility at home and work. Baseline: 12/14/2023: 16/50 = 32% Goal status: INITIAL  2.  Patient will improve cervical rotation and sidebending by 10 degrees to demonstrate improved mobility and ability to complete driving safely. Baseline: 12/14/2023: see above  Goal status: INITIAL  3.  Patient will improve BUE strength by 1/3 MMT grade without pain to demonstrate improved ability to complete daily activities and household chores. Baseline: 12/14/2023: see above  Goal status: INITIAL  4.  Patient will deny headaches within 2 week time period to demonstrate improvement in muscular tightness, specifically upper traps and levator scapulae.  Baseline: 10/2/205: frequent headaches, last on 12/13/2023 Goal status: INITIAL   PLAN:  PT FREQUENCY: 1-2x/week  PT DURATION: 8 weeks  PLANNED INTERVENTIONS: [id number]- PT Re-evaluation, 97750- Physical Performance Testing, 97110-Therapeutic exercises, 97530- Therapeutic activity, 97112- Neuromuscular re-education, 97535- Self Care, [id number]- Manual therapy, [id number]- Electrical stimulation (unattended), [phone]- Electrical stimulation (manual), 20560 (1-2 muscles), 20561 (3+ muscles)- Dry Needling, Patient/Family education, Joint mobilization, Joint manipulation, Spinal manipulation, Spinal mobilization, Cryotherapy, and Moist heat  PLAN FOR NEXT SESSION: HEP review, manual stretching/STM, scapular strengthening    Lonni Pall PT,  DPT Physical Therapist- [facility]  [facility] 01/22/2024, 3:54 PM

## 2024-01-24 ENCOUNTER — Ambulatory Visit

## 2024-01-29 ENCOUNTER — Ambulatory Visit

## 2024-01-29 DIAGNOSIS — M6281 Muscle weakness (generalized): Secondary | ICD-10-CM | POA: Diagnosis not present

## 2024-01-29 DIAGNOSIS — M542 Cervicalgia: Secondary | ICD-10-CM

## 2024-01-29 NOTE — Therapy (Signed)
 OUTPATIENT PHYSICAL THERAPY CERVICAL TREATMENT   Patient Name: Yolanda Barker MRN: [id number] DOB:[birth year], 68 y.o., female Today's Date: 01/29/2024  END OF SESSION:  PT End of Session - 01/29/24 1115     Visit Number 9    Number of Visits 17    Date for Recertification  02/08/24    Authorization - Visit Number 9    Authorization - Number of Visits 17    Progress Note Due on Visit 10    PT Start Time 1115    PT Stop Time 1155    PT Time Calculation (min) 40 min    Activity Tolerance Patient tolerated treatment well    Behavior During Therapy WFL for tasks assessed/performed               Past Medical History:  Diagnosis Date   Adhesive capsulitis of right shoulder    Anemia    Anxiety    Arthritis    Diabetes mellitus without complication (HCC)    Diverticulitis    Dyspnea    GERD (gastroesophageal reflux disease)    Hypertension    IDA (iron deficiency anemia)    Injury of tendon of [place] head of right biceps    Nontraumatic incomplete tear of right rotator cuff    Rotator cuff tendinitis, right    Vertigo    Past Surgical History:  Procedure Laterality Date   ABDOMINAL HYSTERECTOMY     COLONOSCOPY WITH PROPOFOL  N/A 03/15/2016   Procedure: COLONOSCOPY WITH PROPOFOL ;  Surgeon: Gladis RAYMOND Mariner, MD;  Location: [facility] ENDOSCOPY;  Service: Endoscopy;  Laterality: N/A;   COLONOSCOPY WITH PROPOFOL  N/A 11/15/2019   Procedure: COLONOSCOPY WITH PROPOFOL ;  Surgeon: Maryruth Ole DASEN, MD;  Location: [facility] ENDOSCOPY;  Service: Gastroenterology;  Laterality: N/A;   COLONOSCOPY WITH PROPOFOL  N/A 07/26/2022   Procedure: COLONOSCOPY WITH PROPOFOL ;  Surgeon: Maryruth Ole DASEN, MD;  Location: [facility] ENDOSCOPY;  Service: Gastroenterology;  Laterality: N/A;   SHOULDER ARTHROSCOPY WITH OPEN ROTATOR CUFF REPAIR Right 11/14/2017   Procedure: SHOULDER ARTHROSCOPY WITH OPEN ROTATOR CUFF REPAIR;  Surgeon: Edie Norleen PARAS, MD;  Location: [facility] ORS;  Service: Orthopedics;  Laterality:  Right;   SHOULDER CLOSED REDUCTION Right 03/13/2018   Procedure: CLOSED MANIPULATION SHOULDER;  Surgeon: Edie Norleen PARAS, MD;  Location: [facility] ORS;  Service: Orthopedics;  Laterality: Right;   STERIOD INJECTION Right 03/13/2018   Procedure: STEROID INJECTION RIGHT SHOULDER;  Surgeon: Edie Norleen PARAS, MD;  Location: [facility] ORS;  Service: Orthopedics;  Laterality: Right;   TONSILLECTOMY     WISDOM TOOTH EXTRACTION     Patient Active Problem List   Diagnosis Date Noted   Nontraumatic incomplete tear of right rotator cuff 10/30/2017   Gastro-esophageal reflux disease without esophagitis 07/11/2012   Anxiety disorder 12/17/2008   Iron deficiency anemia 12/17/2008    PCP: Carlin Blamer [facility]  REFERRING PROVIDER: Clois Fret, MD  REFERRING DIAG:  [phone] (ICD-10-CM) - Spinal stenosis in cervical region G95.89 (ICD-10-CM) - Myelomalacia of cervical cord (HCC)  THERAPY DIAG:  Cervicalgia  Muscle weakness (generalized)  Rationale for Evaluation and Treatment: Rehabilitation  ONSET DATE: chronic  SUBJECTIVE:  SUBJECTIVE STATEMENT: Patient reports she is here for stiffness in the neck with known stenosis. She reports soreness in both shoulders and shoulder blades. Patient reports intermittent pain in her neck when she first wakes up. She has burning sensation in the forearms. Hand dominance: Right  PERTINENT HISTORY:  Per Neurosurgery MD note on 12/12/23, She has some soreness in her neck on the left side that is intermittent. She continues with intermittent burning in both arms with intermittent tingling in both hands. She has intermittent weakness in her arms. She has intermittent burning in legs as well with intermittent. No issues with walking or balance. She has cervical  stenosis and myelomalacia, however asymptomatic per neurosurgery.  PMH: T2DM, HTN, anxiety, arthritis  PAIN:  Are you having pain? Yes: NPRS scale: 0/10 - 8-9/10  Pain location: neck  Pain description: sore, aching Aggravating factors: sleeping and first getting up  Relieving factors: sports alcohol   PRECAUTIONS: None  RED FLAGS: None     WEIGHT BEARING RESTRICTIONS: No  FALLS:  Has patient fallen in last 6 months? No  LIVING ENVIRONMENT: Lives with: lives with their son Lives in: House/apartment  OCCUPATION: Retired nursing   PLOF: Independent  PATIENT GOALS: I hope it helps and relieves whatever it's supposed to be doing    OBJECTIVE:  Note: Objective measures were completed at Evaluation unless otherwise noted.  DIAGNOSTIC FINDINGS:  EXAM: MRI CERVICAL SPINE WITHOUT CONTRAST  IMPRESSION: 1. Multilevel cervical spondylosis with resultant moderate to severe diffuse spinal stenosis at C3-4 through C6-7, most pronounced at C6-7. 2. Multilevel moderate to severe bilateral foraminal narrowing at C4 through C7 as above. 3. Patchy cord signal abnormality at the level of C4-5, consistent with chronic myelomalacia.  PATIENT SURVEYS:  NDI:  NECK DISABILITY INDEX  Date: 12/14/2023 Score  Pain intensity 0 = I have no pain at the moment  2. Personal care (washing, dressing, etc.) 0 = I can look after myself normally without causing extra pain  3. Lifting 4 =  I can only lift very light weights  4. Reading 1 = I can read as much as I want to with slight pain in my neck  5. Headaches 5 = I have headaches almost all the time  6. Concentration 0 =  I can concentrate fully when I want to with no difficulty  7. Work 1 =  I can only do my usual work, but no more  8. Driving 1 =  I can drive my car as [place] as I want with slight pain in my neck  9. Sleeping 2 = My sleep is mildly disturbed (1-2 hrs sleepless)  10. Recreation 2 = I am able to engage in most, but not all of  my usual recreation activities because of   pain in my neck  Total 16/50 = 32%   Minimum Detectable Change (90% confidence): 5 points or 10% points  COGNITION: Overall cognitive status: Within functional limits for tasks assessed  SENSATION: WFL - although burning sensation and numb to touch but light touch testing normal  POSTURE: No Significant postural limitations  PALPATION: Significant tightness to B UT, levator, cervical paraspinals, and suboccipitals Trigger points noted in B UT  CERVICAL ROM:   Active ROM A/PROM (deg) eval  Flexion WFL  Extension WFL  Right lateral flexion 45  Left lateral flexion 38  Right rotation 29  Left rotation 19   (Blank rows = not tested)  UPPER EXTREMITY ROM: WFL   UPPER EXTREMITY MMT:  MMT Right eval Left eval  Shoulder flexion 4* 4*  Shoulder extension    Shoulder abduction 4* 4*  Shoulder adduction    Shoulder extension    Shoulder internal rotation    Shoulder external rotation    Middle trapezius    Lower trapezius    Elbow flexion 4 4  Elbow extension 4 4  Wrist flexion    Wrist extension    Wrist ulnar deviation    Wrist radial deviation    Wrist pronation    Wrist supination    Grip strength     (Blank rows = not tested)   Grip strength equal  CERVICAL SPECIAL TESTS:  NT - Spurling's contraindicated due to known cervical myelopathy    TREATMENT DATE: 01/29/24                                                                                                                                 Subjective: Patient reports minor aching in both shoulders. Patient reports that her HA are still intermittent but the intensity has greatly reduced. No further questions or concerns.   Neuromuscular Re-education (with intent to improve postural strength and reduce forward posture)   OMEGA Cable Machine    Seated row    2 x 10 - 15#     1 x 10 - 20#     Standing Lat Pull Down    3 x 10 - Blue TB    Standing Face Pull     3 x 10 - Green TB   Seated Upper Trapezius Stretch    L: 30s/bout x 2 in order to improve ROM and tissue extensibility   Doorway Rhomboid Stretch   30s/bout x 3 in order to improve tissue extensibility in scapular muscles    Therapeutic Activity   UBE (seat 7) x 5 minutes (2.5 min fwd/2.5 min bwd) at level 10-6 to improve UE strength and mobility. PT manually adjusted resistance throughout time.    TRX Shoulder Rows for bicep and rhomboid strengthening    3 x 10    Incline Bench Scaption for upward reaching and scapular strength    3 x 10 2# DB    Modified Table Push Plus - Elevated Table for scapular strength    3 x 10 - 24 height    Standing Shoulder Press for upward reaching     3 x 10 - 5#   PATIENT EDUCATION:  Education details: HEP, POC, goals Person educated: Patient Education method: Explanation, Demonstration, and Handouts Education comprehension: verbalized understanding and returned demonstration  HOME EXERCISE PROGRAM: Access Code: [email or web address] URL: [email or web address] Date: 01/29/2024 Prepared by: Lonni Pall  Exercises - Seated Upper Trapezius Stretch  - 2-3 x daily - 5-7 x weekly - 3-5 reps - 30-60 seconds  hold - Seated Levator Scapulae Stretch  - 2-3 x daily - 5-7 x weekly - 3-5 reps - 30-60 seconds  hold - Seated Assisted  Cervical Rotation with Towel  - 1-2 x daily - 5-7 x weekly - 10 reps - 5-10 second hold - Standing Shoulder Row with Anchored Resistance  - 1-2 x daily - 5-7 x weekly - 3 sets - 10 reps - Shoulder extension with resistance - Neutral  - 1-2 x daily - 5-7 x weekly - 3 sets - 10 reps - Shoulder External Rotation and Scapular Retraction with Resistance  - 1-2 x daily - 5-7 x weekly - 3 sets - 10 reps - Standing Shoulder Horizontal Abduction with Resistance  - 1-2 x daily - 5-7 x weekly - 3 sets - 10 reps - Wall Push Up with Plus  - 1 x daily - 5-7 x weekly - 2-3 sets - 10 reps - Standing Shoulder Row with Anchored  Resistance  - 1 x daily - 5-7 x weekly - 2-3 sets - 10-12 reps - Standing Lean Away Doorway Stretch  - 1 x daily - 7 x weekly - 2-3 sets - 30s hold  Access Code: [email or web address] URL: [email or web address] Date: 01/22/2024 Prepared by: Lonni Pall  Exercises - Seated Upper Trapezius Stretch  - 2-3 x daily - 5-7 x weekly - 3-5 reps - 30-60 seconds  hold - Seated Levator Scapulae Stretch  - 2-3 x daily - 5-7 x weekly - 3-5 reps - 30-60 seconds  hold - Seated Assisted Cervical Rotation with Towel  - 1-2 x daily - 5-7 x weekly - 10 reps - 5-10 second hold - Standing Shoulder Row with Anchored Resistance  - 1-2 x daily - 5-7 x weekly - 3 sets - 10 reps - Shoulder extension with resistance - Neutral  - 1-2 x daily - 5-7 x weekly - 3 sets - 10 reps - Shoulder External Rotation and Scapular Retraction with Resistance  - 1-2 x daily - 5-7 x weekly - 3 sets - 10 reps - Standing Shoulder Horizontal Abduction with Resistance  - 1-2 x daily - 5-7 x weekly - 3 sets - 10 reps - Wall Push Up with Plus  - 1 x daily - 5-7 x weekly - 2-3 sets - 10 reps - Standing Shoulder Row with Anchored Resistance  - 1 x daily - 5-7 x weekly - 2-3 sets - 10-12 reps  Access Code: [email or web address] URL: [email or web address] Date: 12/21/2023 Prepared by: Maryanne Finder  Exercises - Seated Upper Trapezius Stretch  - 2-3 x daily - 5-7 x weekly - 3-5 reps - 30-60 seconds  hold - Seated Levator Scapulae Stretch  - 2-3 x daily - 5-7 x weekly - 3-5 reps - 30-60 seconds  hold - Seated Assisted Cervical Rotation with Towel  - 1-2 x daily - 5-7 x weekly - 10 reps - 5-10 second hold - Standing Shoulder Row with Anchored Resistance  - 1-2 x daily - 5-7 x weekly - 3 sets - 10 reps - Shoulder extension with resistance - Neutral  - 1-2 x daily - 5-7 x weekly - 3 sets - 10 reps - Shoulder External Rotation and Scapular Retraction with Resistance  - 1-2 x daily - 5-7 x weekly - 3 sets - 10 reps - Standing Shoulder  Horizontal Abduction with Resistance  - 1-2 x daily - 5-7 x weekly - 3 sets - 10 reps  ASSESSMENT:  CLINICAL IMPRESSION:     Continued PT POC upper neck strengthening in order to improve posture. Patient's HA frequency and neck pain have significantly reduced since start of POC. She also continues to demonstrate independence  with all PT interventions; minimal cues required for proper technique. Good demonstration today of scapular push up on elevated mat table without exacerbation of lower back or upper neck pain. Patient vocalized improvements in upper neck pain with OH reach using 5# DB in today's session. She is agreeable to d/c in following appointment based on her progress. PT to review progress towards PT goals and provide a comprehensive HEP in order for patient to maintain current progress.   OBJECTIVE IMPAIRMENTS: decreased activity tolerance, decreased ROM, decreased strength, impaired flexibility, impaired sensation, postural dysfunction, and pain.   ACTIVITY LIMITATIONS: lifting, bending, and sleeping  PARTICIPATION LIMITATIONS: cleaning, laundry, and community activity  PERSONAL FACTORS: Age, Past/current experiences, and known cervical stenosis with myelomalacia are also affecting patient's functional outcome.   REHAB POTENTIAL: Good  CLINICAL DECISION MAKING: Stable/uncomplicated  EVALUATION COMPLEXITY: Low   GOALS: Goals reviewed with patient? Yes  SHORT TERM GOALS: Target date: 01/11/2024  Patient will be independent in HEP to improve strength/mobility for better functional independence with ADLs. Baseline: 12/14/2023: HEP initiated  Goal status: INITIAL   Gatt TERM GOALS: Target date: 02/08/2024  Patient will reduce Neck Disability Index score to <10% to demonstrate minimal disability with ADL's including improved sleeping tolerance, sitting tolerance, etc for better mobility at home and work. Baseline: 12/14/2023: 16/50 = 32% Goal status: INITIAL  2.  Patient  will improve cervical rotation and sidebending by 10 degrees to demonstrate improved mobility and ability to complete driving safely. Baseline: 12/14/2023: see above  Goal status: INITIAL  3.  Patient will improve BUE strength by 1/3 MMT grade without pain to demonstrate improved ability to complete daily activities and household chores. Baseline: 12/14/2023: see above  Goal status: INITIAL  4.  Patient will deny headaches within 2 week time period to demonstrate improvement in muscular tightness, specifically upper traps and levator scapulae.  Baseline: 10/2/205: frequent headaches, last on 12/13/2023 Goal status: INITIAL   PLAN:  PT FREQUENCY: 1-2x/week  PT DURATION: 8 weeks  PLANNED INTERVENTIONS: [id number]- PT Re-evaluation, 97750- Physical Performance Testing, 97110-Therapeutic exercises, 97530- Therapeutic activity, 97112- Neuromuscular re-education, 97535- Self Care, [id number]- Manual therapy, [id number]- Electrical stimulation (unattended), [phone]- Electrical stimulation (manual), 20560 (1-2 muscles), 20561 (3+ muscles)- Dry Needling, Patient/Family education, Joint mobilization, Joint manipulation, Spinal manipulation, Spinal mobilization, Cryotherapy, and Moist heat  PLAN FOR NEXT SESSION: HEP review, manual stretching/STM, scapular strengthening    Lonni Pall PT, DPT Physical Therapist- [place]  [facility] 01/29/2024, 11:16 AM

## 2024-01-31 ENCOUNTER — Ambulatory Visit

## 2024-02-05 ENCOUNTER — Ambulatory Visit

## 2024-02-05 DIAGNOSIS — M542 Cervicalgia: Secondary | ICD-10-CM

## 2024-02-05 DIAGNOSIS — M6281 Muscle weakness (generalized): Secondary | ICD-10-CM

## 2024-02-05 NOTE — Therapy (Signed)
 OUTPATIENT PHYSICAL THERAPY CERVICAL TREATMENT/DISCHARGE SUMMARY    Patient Name: Yolanda Barker MRN: [id number] DOB:05/01/1955, 68 y.o., female Today's Date: 02/05/2024  END OF SESSION:  PT End of Session - 02/05/24 1258     Visit Number 10    Number of Visits 17    Date for Recertification  02/08/24    Authorization - Number of Visits 17    Progress Note Due on Visit 10    PT Start Time 1300    PT Stop Time 1342    PT Time Calculation (min) 42 min    Activity Tolerance Patient tolerated treatment well    Behavior During Therapy WFL for tasks assessed/performed                Past Medical History:  Diagnosis Date   Adhesive capsulitis of right shoulder    Anemia    Anxiety    Arthritis    Diabetes mellitus without complication (HCC)    Diverticulitis    Dyspnea    GERD (gastroesophageal reflux disease)    Hypertension    IDA (iron deficiency anemia)    Injury of tendon of [place] head of right biceps    Nontraumatic incomplete tear of right rotator cuff    Rotator cuff tendinitis, right    Vertigo    Past Surgical History:  Procedure Laterality Date   ABDOMINAL HYSTERECTOMY     COLONOSCOPY WITH PROPOFOL  N/A 03/15/2016   Procedure: COLONOSCOPY WITH PROPOFOL ;  Surgeon: Gladis RAYMOND Mariner, MD;  Location: [facility] ENDOSCOPY;  Service: Endoscopy;  Laterality: N/A;   COLONOSCOPY WITH PROPOFOL  N/A 11/15/2019   Procedure: COLONOSCOPY WITH PROPOFOL ;  Surgeon: Maryruth Ole DASEN, MD;  Location: [facility] ENDOSCOPY;  Service: Gastroenterology;  Laterality: N/A;   COLONOSCOPY WITH PROPOFOL  N/A 07/26/2022   Procedure: COLONOSCOPY WITH PROPOFOL ;  Surgeon: Maryruth Ole DASEN, MD;  Location: [facility] ENDOSCOPY;  Service: Gastroenterology;  Laterality: N/A;   SHOULDER ARTHROSCOPY WITH OPEN ROTATOR CUFF REPAIR Right 11/14/2017   Procedure: SHOULDER ARTHROSCOPY WITH OPEN ROTATOR CUFF REPAIR;  Surgeon: Edie Norleen PARAS, MD;  Location: [facility] ORS;  Service: Orthopedics;  Laterality: Right;    SHOULDER CLOSED REDUCTION Right 03/13/2018   Procedure: CLOSED MANIPULATION SHOULDER;  Surgeon: Edie Norleen PARAS, MD;  Location: [facility] ORS;  Service: Orthopedics;  Laterality: Right;   STERIOD INJECTION Right 03/13/2018   Procedure: STEROID INJECTION RIGHT SHOULDER;  Surgeon: Edie Norleen PARAS, MD;  Location: [facility] ORS;  Service: Orthopedics;  Laterality: Right;   TONSILLECTOMY     WISDOM TOOTH EXTRACTION     Patient Active Problem List   Diagnosis Date Noted   Nontraumatic incomplete tear of right rotator cuff 10/30/2017   Gastro-esophageal reflux disease without esophagitis 07/11/2012   Anxiety disorder 12/17/2008   Iron deficiency anemia 12/17/2008    PCP: Carlin Blamer [facility]  REFERRING PROVIDER: Clois Fret, MD  REFERRING DIAG:  [phone] (ICD-10-CM) - Spinal stenosis in cervical region G95.89 (ICD-10-CM) - Myelomalacia of cervical cord (HCC)  THERAPY DIAG:  Cervicalgia  Muscle weakness (generalized)  Rationale for Evaluation and Treatment: Rehabilitation  ONSET DATE: chronic  SUBJECTIVE:  SUBJECTIVE STATEMENT: Patient reports she is here for stiffness in the neck with known stenosis. She reports soreness in both shoulders and shoulder blades. Patient reports intermittent pain in her neck when she first wakes up. She has burning sensation in the forearms. Hand dominance: Right  PERTINENT HISTORY:  Per Neurosurgery MD note on 12/12/23, She has some soreness in her neck on the left side that is intermittent. She continues with intermittent burning in both arms with intermittent tingling in both hands. She has intermittent weakness in her arms. She has intermittent burning in legs as well with intermittent. No issues with walking or balance. She has cervical stenosis  and myelomalacia, however asymptomatic per neurosurgery.  PMH: T2DM, HTN, anxiety, arthritis  PAIN:  Are you having pain? Yes: NPRS scale: 0/10 - 8-9/10  Pain location: neck  Pain description: sore, aching Aggravating factors: sleeping and first getting up  Relieving factors: sports alcohol   PRECAUTIONS: None  RED FLAGS: None     WEIGHT BEARING RESTRICTIONS: No  FALLS:  Has patient fallen in last 6 months? No  LIVING ENVIRONMENT: Lives with: lives with their son Lives in: House/apartment  OCCUPATION: Retired nursing   PLOF: Independent  PATIENT GOALS: I hope it helps and relieves whatever it's supposed to be doing    OBJECTIVE:  Note: Objective measures were completed at Evaluation unless otherwise noted.  DIAGNOSTIC FINDINGS:  EXAM: MRI CERVICAL SPINE WITHOUT CONTRAST  IMPRESSION: 1. Multilevel cervical spondylosis with resultant moderate to severe diffuse spinal stenosis at C3-4 through C6-7, most pronounced at C6-7. 2. Multilevel moderate to severe bilateral foraminal narrowing at C4 through C7 as above. 3. Patchy cord signal abnormality at the level of C4-5, consistent with chronic myelomalacia.  PATIENT SURVEYS:  NDI:  NECK DISABILITY INDEX  Date: 12/14/2023 Score  Pain intensity 0 = I have no pain at the moment  2. Personal care (washing, dressing, etc.) 0 = I can look after myself normally without causing extra pain  3. Lifting 4 =  I can only lift very light weights  4. Reading 1 = I can read as much as I want to with slight pain in my neck  5. Headaches 5 = I have headaches almost all the time  6. Concentration 0 =  I can concentrate fully when I want to with no difficulty  7. Work 1 =  I can only do my usual work, but no more  8. Driving 1 =  I can drive my car as [place] as I want with slight pain in my neck  9. Sleeping 2 = My sleep is mildly disturbed (1-2 hrs sleepless)  10. Recreation 2 = I am able to engage in most, but not all of my usual  recreation activities because of   pain in my neck  Total 16/50 = 32%   Minimum Detectable Change (90% confidence): 5 points or 10% points  COGNITION: Overall cognitive status: Within functional limits for tasks assessed  SENSATION: WFL - although burning sensation and numb to touch but light touch testing normal  POSTURE: No Significant postural limitations  PALPATION: Significant tightness to B UT, levator, cervical paraspinals, and suboccipitals Trigger points noted in B UT  CERVICAL ROM:   Active ROM A/PROM (deg) eval  Flexion WFL  Extension WFL  Right lateral flexion 45  Left lateral flexion 38  Right rotation 29  Left rotation 19   (Blank rows = not tested)  UPPER EXTREMITY ROM: WFL   UPPER EXTREMITY MMT:  MMT Right eval Left eval  Shoulder flexion 4* 4*  Shoulder extension    Shoulder abduction 4* 4*  Shoulder adduction    Shoulder extension    Shoulder internal rotation    Shoulder external rotation    Middle trapezius    Lower trapezius    Elbow flexion 4 4  Elbow extension 4 4  Wrist flexion    Wrist extension    Wrist ulnar deviation    Wrist radial deviation    Wrist pronation    Wrist supination    Grip strength     (Blank rows = not tested)   Grip strength equal  CERVICAL SPECIAL TESTS:  NT - Spurling's contraindicated due to known cervical myelopathy    TREATMENT DATE: 02/05/24                                                                                                                                 Subjective:  Patient arrived without neck pain today. She still has persistent bilateral arm pain. No further questions or concerns.   Neuromuscular Re-education (with intent to improve postural strength and reduce forward posture)   Standing Shoulder Row    3 x 10 - Blue TB    Standing Shoulder Horizontal Abduction     3 x 10 - Red TB    Standing Shoulder Extension against resistance   3 x 10 - Blue TB    Seated Shoulder  External Rotation with Scapular Retraction   3 x 10 - Red TB    Seated Assisted Cervical Rotation with Towel   R/L: 1  x 5      - Multimodal cues for hand placement on towel   Time spent educating patient on remaining exercises on HEP. Addressed remaining questions and reviewed frequency, sets and reps.   Therapeutic Activity   UBE (seat 7) x 5 minutes (2.5 min fwd/2.5 min bwd) at level 10-6 to improve UE strength and mobility. PT manually adjusted resistance throughout time.    Wall Push Up Plus    2 x 10    Standing Scaption with DB    2 x 10 - 2# DB    PATIENT EDUCATION:  Education details: Exercise Technique  Person educated: Patient Education method: Explanation, Demonstration, and Handouts Education comprehension: verbalized understanding and returned demonstration  HOME EXERCISE PROGRAM: Access Code: [email or web address] URL: [email or web address] Date: 01/29/2024 Prepared by: Lonni Chinara Hertzberg  Exercises - Seated Upper Trapezius Stretch  - 2-3 x daily - 5-7 x weekly - 3-5 reps - 30-60 seconds  hold - Seated Levator Scapulae Stretch  - 2-3 x daily - 5-7 x weekly - 3-5 reps - 30-60 seconds  hold - Seated Assisted Cervical Rotation with Towel  - 1-2 x daily - 5-7 x weekly - 10 reps - 5-10 second hold - Standing Shoulder Row with Anchored Resistance  - 1-2 x daily - 5-7 x weekly - 3 sets -  10 reps - Shoulder extension with resistance - Neutral  - 1-2 x daily - 5-7 x weekly - 3 sets - 10 reps - Shoulder External Rotation and Scapular Retraction with Resistance  - 1-2 x daily - 5-7 x weekly - 3 sets - 10 reps - Standing Shoulder Horizontal Abduction with Resistance  - 1-2 x daily - 5-7 x weekly - 3 sets - 10 reps - Wall Push Up with Plus  - 1 x daily - 5-7 x weekly - 2-3 sets - 10 reps - Standing Shoulder Row with Anchored Resistance  - 1 x daily - 5-7 x weekly - 2-3 sets - 10-12 reps - Standing Lean Away Doorway Stretch  - 1 x daily - 7 x weekly - 2-3 sets - 30s  hold  Access Code: [email or web address] URL: [email or web address] Date: 01/22/2024 Prepared by: Lonni Pall  Exercises - Seated Upper Trapezius Stretch  - 2-3 x daily - 5-7 x weekly - 3-5 reps - 30-60 seconds  hold - Seated Levator Scapulae Stretch  - 2-3 x daily - 5-7 x weekly - 3-5 reps - 30-60 seconds  hold - Seated Assisted Cervical Rotation with Towel  - 1-2 x daily - 5-7 x weekly - 10 reps - 5-10 second hold - Standing Shoulder Row with Anchored Resistance  - 1-2 x daily - 5-7 x weekly - 3 sets - 10 reps - Shoulder extension with resistance - Neutral  - 1-2 x daily - 5-7 x weekly - 3 sets - 10 reps - Shoulder External Rotation and Scapular Retraction with Resistance  - 1-2 x daily - 5-7 x weekly - 3 sets - 10 reps - Standing Shoulder Horizontal Abduction with Resistance  - 1-2 x daily - 5-7 x weekly - 3 sets - 10 reps - Wall Push Up with Plus  - 1 x daily - 5-7 x weekly - 2-3 sets - 10 reps - Standing Shoulder Row with Anchored Resistance  - 1 x daily - 5-7 x weekly - 2-3 sets - 10-12 reps   ASSESSMENT:  CLINICAL IMPRESSION:     Ms Ja Pistole is a 68 y.o. female seen for cervicalgia. Pt reached end of POC and is agreeable to discharge to home with HEP. Pt has met 2/4 remaining LT goals and demonstrates improvements in UE strength, cervical ROM and function (see goals below). The patient has achieved independence with daily activities and is able to perform exercises with proper technique. They no longer require skilled physical therapy interventions and are discharged with a home exercise program to maintain progress towards remaining LTGs. No question at end of session. PT advised follow-up with their primary care provider is recommended if any issues arise.  OBJECTIVE IMPAIRMENTS: decreased activity tolerance, decreased ROM, decreased strength, impaired flexibility, impaired sensation, postural dysfunction, and pain.   ACTIVITY LIMITATIONS: lifting, bending, and  sleeping  PARTICIPATION LIMITATIONS: cleaning, laundry, and community activity  PERSONAL FACTORS: Age, Past/current experiences, and known cervical stenosis with myelomalacia are also affecting patient's functional outcome.   REHAB POTENTIAL: Good  CLINICAL DECISION MAKING: Stable/uncomplicated  EVALUATION COMPLEXITY: Low   GOALS: Goals reviewed with patient? Yes  SHORT TERM GOALS: Target date: 01/11/2024  Patient will be independent in HEP to improve strength/mobility for better functional independence with ADLs. Baseline: 12/14/2023: HEP initiated; 02/05/2024: 50% adherence Goal status: Progressing    Rushing TERM GOALS: Target date: 02/08/2024  Patient will reduce Neck Disability Index score to <10% to demonstrate minimal disability with ADL's including improved sleeping tolerance,  sitting tolerance, etc for better mobility at home and work. Baseline: 12/14/2023: 16/50 = 32%; 02/05/2024:  7 / 50 = 14.0 % Goal status: Goal Met  2.  Patient will improve cervical rotation and sidebending by 10 degrees to demonstrate improved mobility and ability to complete driving safely. Baseline: 12/14/2023: see above ; 02/05/2024:  Active ROM A/PROM (deg) eval AROM  Flexion WFL   Extension WFL   Right lateral flexion 45 35  Left lateral flexion 38 35  Right rotation 29 55  Left rotation 19 55  Goal status: Progressing  3.  Patient will improve BUE strength by 1/3 MMT grade without pain to demonstrate improved ability to complete daily activities and household chores. Baseline: 12/14/2023: see above; 02/05/2024: MMT (out of 5) Right eval Left eval R/L  Shoulder flexion 4* 4* 4+/4+  Shoulder extension     Shoulder abduction 4* 4* 4+/4+*  Elbow flexion 4 4 5/5  Elbow extension 4 4 5/5  Pain indicated with * Goal status: Goal MET  4.  Patient will deny headaches within 2 week time period to demonstrate improvement in muscular tightness, specifically upper traps and levator scapulae.   Baseline: 10/2/205: frequent headaches, last on 12/13/2023; 02/05/2024: minor HA mitigated with tylenol  but no major HA since beginning of POC  Goal status: Goal Met  PLAN:  PT FREQUENCY: 1-2x/week  PT DURATION: 8 weeks  PLANNED INTERVENTIONS: 97164- PT Re-evaluation, 97750- Physical Performance Testing, 97110-Therapeutic exercises, 97530- Therapeutic activity, 97112- Neuromuscular re-education, 97535- Self Care, 02859- Manual therapy, [id number]- Electrical stimulation (unattended), [phone]- Electrical stimulation (manual), 20560 (1-2 muscles), 20561 (3+ muscles)- Dry Needling, Patient/Family education, Joint mobilization, Joint manipulation, Spinal manipulation, Spinal mobilization, Cryotherapy, and Moist heat  PLAN FOR NEXT SESSION: DISCHARGE   Lonni Pall PT, DPT Physical Therapist- [facility]  [facility] 02/05/2024, 1:48 PM

## 2024-02-05 NOTE — Progress Notes (Signed)
 Referring Physician:  Center, Carlin Blamer [facility] [address] [place],  [place] [address]  Primary Physician:  [facility]  History of Present Illness: [name] has a history of GERD, anxiety, HTN.   Last seen by Dr. Clois on 12/12/23. She has known chronic myelomalacia C4-C5 with moderate/severe central stenosis. She has moderate central stenosis C5-C6 and severe central stenosis C6-C7. She has multilevel moderate/severe foraminal stenosis.   She had neck stiffness, but no symptoms of myelopathy. He did not strongly recommend surgery. He sent her to PT and she is here for follow up.   She had initial PT evaluation on 12/14/23 with 8 additional visits through 02/05/24*  She is doing much better! Feels like PT helped with strength in her arms. She has no weakness. Still has occasional burning in her arms with some soreness. She continues with her HEP.   No new dexterity issues or changes in her balance.   Tobacco use: Does not smoke.   Conservative measures:  Physical therapy:  initial evaluation on 12/14/23 with 8 additional visits through 02/05/24. She was discharged per patient.  Multimodal medical therapy including regular antiinflammatories:  Tylenol , Ibuprofen , Tizanidine, Gabapentin Injections:  09/01/23- Large Joint Injection: L subacromial bursa   Past Surgery: no spine or neck surgery  Jamyla E Reesman has no symptoms of cervical myelopathy.  The symptoms are causing a significant impact on the patient's life.   Review of Systems:  A 10 point review of systems is negative, except for the pertinent positives and negatives detailed in the HPI.  Past Medical History: Past Medical History:  Diagnosis Date   Adhesive capsulitis of right shoulder    Anemia    Anxiety    Arthritis    Diabetes mellitus without complication (HCC)    Diverticulitis    Dyspnea    GERD (gastroesophageal reflux disease)    Hypertension     IDA (iron deficiency anemia)    Injury of tendon of [place] head of right biceps    Nontraumatic incomplete tear of right rotator cuff    Rotator cuff tendinitis, right    Vertigo     Past Surgical History: Past Surgical History:  Procedure Laterality Date   ABDOMINAL HYSTERECTOMY     COLONOSCOPY WITH PROPOFOL  N/A 03/15/2016   Procedure: COLONOSCOPY WITH PROPOFOL ;  Surgeon: Gladis RAYMOND Mariner, MD;  Location: [facility] ENDOSCOPY;  Service: Endoscopy;  Laterality: N/A;   COLONOSCOPY WITH PROPOFOL  N/A 11/15/2019   Procedure: COLONOSCOPY WITH PROPOFOL ;  Surgeon: Maryruth Ole DASEN, MD;  Location: [facility] ENDOSCOPY;  Service: Gastroenterology;  Laterality: N/A;   COLONOSCOPY WITH PROPOFOL  N/A 07/26/2022   Procedure: COLONOSCOPY WITH PROPOFOL ;  Surgeon: Maryruth Ole DASEN, MD;  Location: [facility] ENDOSCOPY;  Service: Gastroenterology;  Laterality: N/A;   SHOULDER ARTHROSCOPY WITH OPEN ROTATOR CUFF REPAIR Right 11/14/2017   Procedure: SHOULDER ARTHROSCOPY WITH OPEN ROTATOR CUFF REPAIR;  Surgeon: Edie Norleen PARAS, MD;  Location: [facility] ORS;  Service: Orthopedics;  Laterality: Right;   SHOULDER CLOSED REDUCTION Right 03/13/2018   Procedure: CLOSED MANIPULATION SHOULDER;  Surgeon: Edie Norleen PARAS, MD;  Location: [facility] ORS;  Service: Orthopedics;  Laterality: Right;   STERIOD INJECTION Right 03/13/2018   Procedure: STEROID INJECTION RIGHT SHOULDER;  Surgeon: Edie Norleen PARAS, MD;  Location: [facility] ORS;  Service: Orthopedics;  Laterality: Right;   TONSILLECTOMY     WISDOM TOOTH EXTRACTION      Allergies: Allergies as of 02/13/2024 - Review Complete 01/29/2024  Allergen Reaction  Noted   Penicillin g Rash and Other (See Comments) 06/03/2015    Medications: Outpatient Encounter Medications as of 02/13/2024  Medication Sig   ACCU-CHEK GUIDE TEST test strip 1 strip.   acetaminophen  (TYLENOL ) 500 MG tablet Take 500 mg by mouth every 6 (six) hours as needed for moderate pain or headache.    Cholecalciferol (VITAMIN  D-1000 MAX ST) 1000 units tablet Take 1,000 Units by mouth daily.    docusate sodium  (COLACE) 100 MG capsule Take 1 tablet once or twice daily as needed for constipation   ferrous sulfate 325 (65 FE) MG tablet Take 325 mg by mouth daily.    ibuprofen  (ADVIL ) 200 MG tablet Take 200 mg by mouth every 6 (six) hours as needed.   losartan (COZAAR) 25 MG tablet Take 25 mg by mouth daily.   meclizine (ANTIVERT) 25 MG tablet Take 25 mg by mouth 3 (three) times daily as needed for dizziness.    polyethylene glycol (MIRALAX / GLYCOLAX) 17 g packet Take 17 g by mouth daily.   Propylene Glycol (SYSTANE BALANCE) 0.6 % SOLN Place 1 drop into both eyes daily as needed (for dry eyes).   No facility-administered encounter medications on file as of 02/13/2024.    Social History: Social History   Tobacco Use   Smoking status: Former    Current packs/day: 0.00    Average packs/day: 1 pack/day for 15.0 years (15.0 ttl pk-yrs)    Types: Cigarettes    Start date: 11/08/1982    Quit date: 11/07/1997    Years since quitting: 26.2   Smokeless tobacco: Never  Vaping Use   Vaping status: Never Used  Substance Use Topics   Alcohol use: No   Drug use: No    Family Medical History: Family History  Problem Relation Age of Onset   Bone cancer Mother    Stroke Father    Breast cancer Neg Hx     Physical Examination: Vitals:   02/13/24 0924 02/13/24 0958  BP: (!) 152/74 (!) 142/80      Awake, alert, oriented to person, place, and time.  Speech is clear and fluent. Fund of knowledge is appropriate.   Cranial Nerves: Pupils equal round and reactive to light.  Facial tone is symmetric.    No abnormal lesions on exposed skin.   Strength: Side Biceps Triceps Deltoid Interossei Grip Wrist Ext. Wrist Flex.  R 5 5 5 5 5 5 5   L 5 5 5 5 5 5 5    Side Iliopsoas Quads Hamstring PF DF EHL  R 5 5 5 5 5 5   L 5 5 5 5 5 5    Reflexes are 1+ and symmetric at the biceps, brachioradialis, patella and achilles.    Hoffman's is absent.  Clonus is not present.   Bilateral upper and lower extremity sensation is intact to light touch.     Gait is normal.     Medical Decision Making  Imaging: None  Assessment and Plan: She is doing much better! Feels like PT helped with strength in her arms. She has no weakness. Still has occasional burning in her arms with some soreness.   She has known chronic myelomalacia C4-C5 with moderate/severe central stenosis. She has moderate central stenosis C5-C6 and severe central stenosis C6-C7. She has multilevel moderate/severe foraminal stenosis.   She has no signs/symptoms of cervical myelopathy.   Treatment options discussed with patient and following plan made:   - Continue with HEP from PT. Will let me know  if she wants to restart formal PT.  - Follow up with me in 6 months.  - Reviewed signs/symptoms of myelopathy. Will call if she develops them.   BP was elevated. No symptoms of chest pain, shortness of breath, blurry vision, or headaches. She will recheck at home and call PCP if not improved. If she develops CP, SOB, blurry vision, or headaches, then she will go to ED.   I spent a total of 15 minutes in face-to-face and non-face-to-face activities related to this patient's care today including review of outside records, review of imaging, review of symptoms, physical exam, discussion of differential diagnosis, discussion of treatment options, and documentation.   Thank you for involving me in the care of this patient.   Glade Boys PA-C Dept. of Neurosurgery

## 2024-02-13 ENCOUNTER — Ambulatory Visit: Admitting: Orthopedic Surgery

## 2024-02-13 ENCOUNTER — Encounter: Payer: Self-pay | Admitting: Orthopedic Surgery

## 2024-02-13 VITALS — BP 142/80 | Ht 59.0 in | Wt 127.2 lb

## 2024-02-13 DIAGNOSIS — M47812 Spondylosis without myelopathy or radiculopathy, cervical region: Secondary | ICD-10-CM

## 2024-02-13 DIAGNOSIS — G9589 Other specified diseases of spinal cord: Secondary | ICD-10-CM | POA: Diagnosis not present

## 2024-02-13 DIAGNOSIS — M4802 Spinal stenosis, cervical region: Secondary | ICD-10-CM | POA: Diagnosis not present

## 2024-02-13 DIAGNOSIS — G959 Disease of spinal cord, unspecified: Secondary | ICD-10-CM

## 2024-02-13 NOTE — Patient Instructions (Signed)
 It was nice to see you today.   I am glad that you are feeling better!   Continue doing the exercises from PT. Let me know if you want to go back.   Let me know if you notice your hands not working, having difficulty doing buttons/zippers, dropping everything, or having balance issues (falling a lot).   I will see you back in 6 months.    Please call with any questions or concerns.   Your blood pressure was elevated today. I want you to recheck it at home and follow up with your PCP if it remains high. If you have any chest pain, shortness of breath, blurry vision, or headaches then you need to go to ED. We have also sent your PCP a message to let them know about your elevated blood pressure.    Glade Boys PA-C [phone]     The physicians and staff at [facility] at [facility] are committed to providing excellent care. You may receive a survey asking for feedback about your experience at our office. We value you your feedback and appreciate you taking the time to to fill it out. The [facility] leadership team is also available to discuss your experience in person, feel free to contact us  [phone].

## 2024-03-27 ENCOUNTER — Other Ambulatory Visit: Payer: Self-pay | Admitting: Family Medicine

## 2024-03-27 DIAGNOSIS — Z1231 Encounter for screening mammogram for malignant neoplasm of breast: Secondary | ICD-10-CM

## 2024-04-22 ENCOUNTER — Inpatient Hospital Stay: Admission: RE | Admit: 2024-04-22 | Source: Ambulatory Visit

## 2024-08-13 ENCOUNTER — Ambulatory Visit: Admitting: Orthopedic Surgery
